# Patient Record
Sex: Female | Born: 1995 | Race: White | Hispanic: No | Marital: Single | Smoking: Current some day smoker
Health system: Southern US, Community
[De-identification: ages and names within clinical notes are randomized; demographics above are authoritative.]

## PROBLEM LIST (undated history)

## (undated) ENCOUNTER — Emergency Department (HOSPITAL_COMMUNITY): Admission: EM | Payer: Self-pay | Source: Home / Self Care

## (undated) DIAGNOSIS — J45909 Unspecified asthma, uncomplicated: Secondary | ICD-10-CM

## (undated) DIAGNOSIS — I10 Essential (primary) hypertension: Secondary | ICD-10-CM

## (undated) DIAGNOSIS — IMO0002 Reserved for concepts with insufficient information to code with codable children: Secondary | ICD-10-CM

## (undated) DIAGNOSIS — F418 Other specified anxiety disorders: Secondary | ICD-10-CM

## (undated) DIAGNOSIS — T7840XA Allergy, unspecified, initial encounter: Secondary | ICD-10-CM

## (undated) DIAGNOSIS — M7989 Other specified soft tissue disorders: Secondary | ICD-10-CM

## (undated) DIAGNOSIS — B9689 Other specified bacterial agents as the cause of diseases classified elsewhere: Secondary | ICD-10-CM

## (undated) DIAGNOSIS — Z975 Presence of (intrauterine) contraceptive device: Secondary | ICD-10-CM

## (undated) DIAGNOSIS — F429 Obsessive-compulsive disorder, unspecified: Secondary | ICD-10-CM

## (undated) DIAGNOSIS — E669 Obesity, unspecified: Secondary | ICD-10-CM

## (undated) DIAGNOSIS — R4586 Emotional lability: Secondary | ICD-10-CM

## (undated) DIAGNOSIS — F319 Bipolar disorder, unspecified: Secondary | ICD-10-CM

## (undated) HISTORY — PX: CYST EXCISION: SHX5701

## (undated) HISTORY — PX: OTHER SURGICAL HISTORY: SHX169

## (undated) HISTORY — DX: Emotional lability: R45.86

## (undated) HISTORY — DX: Allergy, unspecified, initial encounter: T78.40XA

---
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
## 2014-03-06 ENCOUNTER — Ambulatory Visit: Payer: Self-pay | Admitting: Family Medicine

## 2014-12-18 ENCOUNTER — Encounter: Payer: Self-pay | Admitting: Family Medicine

## 2014-12-18 ENCOUNTER — Ambulatory Visit (INDEPENDENT_AMBULATORY_CARE_PROVIDER_SITE_OTHER): Payer: Medicaid Other | Admitting: Family Medicine

## 2014-12-18 VITALS — BP 114/78 | HR 79 | Temp 98.6°F | Ht 64.0 in | Wt 161.6 lb

## 2014-12-18 DIAGNOSIS — J019 Acute sinusitis, unspecified: Secondary | ICD-10-CM

## 2014-12-18 MED ORDER — FLUTICASONE PROPIONATE 50 MCG/ACT NA SUSP: 1.0000 | Freq: Two times a day (BID) | NASAL | Status: DC | PRN

## 2014-12-18 MED ORDER — CETIRIZINE HCL 5 MG/5ML PO SYRP: 10.0000 mg | ORAL_SOLUTION | Freq: Every day | ORAL | Status: DC

## 2014-12-18 NOTE — Progress Notes (Signed)
BP 114/78 mmHg  Pulse 79  Temp(Src) 98.6 F (37 C) (Oral)  Ht  (1.626 m)  Wt 161 lb 9.6 oz (73.301 kg)  BMI 27.72 kg/m2   Subjective:    Patient ID: Tonya Franklin, female    DOB: 12-Jan-1996, 19 y.o.   MRN: [id number]  HPI: Tonya Franklin is a 19 y.o. female presenting on 12/18/2014 for Cough; Sinusitis; and URI   HPI Cough and sinus pressure Patient has been having cough and sinus pressure for the past 3-4 days. She says she had a palpable fever about 4 days ago but none since. Her mother has been ill before her. she just recently had a new child 5 days ago. She does have yellow to white productive phlegm when she coughs and is getting a lot of postnasal drainage and sinus pressure. She has tried Tylenol and ibuprofen over-the-counter without much success. She says she cannot take pills at all. Her baby is not ill at all yet. Discussed the possibility of starting breast-feeding to protect her baby.  Relevant past medical, surgical, family and social history reviewed and updated as indicated. Interim medical history since our last visit reviewed. Allergies and medications reviewed and updated.  Review of Systems  Constitutional: Negative for fever and chills.  HENT: Positive for congestion, postnasal drip, rhinorrhea, sinus pressure and sore throat. Negative for ear discharge, ear pain and sneezing.   Eyes: Negative for pain, redness and visual disturbance.  Respiratory: Positive for cough. Negative for chest tightness and shortness of breath.   Cardiovascular: Negative for chest pain and leg swelling.  Genitourinary: Negative for dysuria and difficulty urinating.  Musculoskeletal: Negative for back pain and gait problem.  Skin: Negative for rash.  Neurological: Negative for dizziness, light-headedness and headaches.  Psychiatric/Behavioral: Negative for behavioral problems and agitation.  All other systems reviewed and are negative.   Per HPI unless specifically  indicated above     Medication List       This list is accurate as of: 12/18/14  2:05 PM.  Always use your most recent med list.               cetirizine HCl 5 MG/5ML Syrp  Commonly known as:  Zyrtec  Take 10 mLs (10 mg total) by mouth daily.     fluticasone 50 MCG/ACT nasal spray  Commonly known as:  FLONASE  Place 1 spray into both nostrils 2 (two) times daily as needed for allergies or rhinitis.           Objective:    BP 114/78 mmHg  Pulse 79  Temp(Src) 98.6 F (37 C) (Oral)  Ht  (1.626 m)  Wt 161 lb 9.6 oz (73.301 kg)  BMI 27.72 kg/m2  Wt Readings from Last 3 Encounters:  12/18/14 161 lb 9.6 oz (73.301 kg)  05/16/13 158 lb 3.2 oz (71.759 kg) (87 %*, Z = 1.14)  02/26/13 150 lb (68.04 kg) (82 %*, Z = 0.93)   * Growth percentiles are based on CDC 2-20 Years data.    Physical Exam  Constitutional: She is oriented to person, place, and time. She appears well-developed and well-nourished. No distress.  HENT:  Right Ear: Tympanic membrane, external ear and ear canal normal.  Left Ear: Tympanic membrane, external ear and ear canal normal.  Nose: Mucosal edema and rhinorrhea present. No epistaxis. Right sinus exhibits no maxillary sinus tenderness and no frontal sinus tenderness. Left sinus exhibits no maxillary sinus tenderness and no frontal  sinus tenderness.  Mouth/Throat: Uvula is midline and mucous membranes are normal. Posterior oropharyngeal edema and posterior oropharyngeal erythema present. No oropharyngeal exudate or tonsillar abscesses.  Eyes: Conjunctivae and EOM are normal. Pupils are equal, round, and reactive to light.  Neck: Neck supple. No thyromegaly present.  Cardiovascular: Normal rate, regular rhythm, normal heart sounds and intact distal pulses.   No murmur heard. Pulmonary/Chest: Effort normal and breath sounds normal. No respiratory distress. She has no wheezes.  Musculoskeletal: Normal range of motion. She exhibits no edema or tenderness.   Lymphadenopathy:    She has no cervical adenopathy.  Neurological: She is alert and oriented to person, place, and time. Coordination normal.  Skin: Skin is warm and dry. No rash noted. She is not diaphoretic.  Psychiatric: She has a normal mood and affect. Her behavior is normal.  Nursing note and vitals reviewed.   Results for orders placed or performed in visit on 05/16/13  GC/Chlamydia Probe Amp  Result Value Ref Range   Chlamydia trachomatis, NAA Negative Negative   Neisseria gonorrhoeae by PCR Negative Negative   PLEASE NOTE: Comment   POCT urinalysis dipstick  Result Value Ref Range   Color, UA yellow    Clarity, UA cloudy    Glucose, UA neg    Bilirubin, UA neg    Ketones, UA small    Spec Grav, UA 1.010    Blood, UA trace    pH, UA 6.0    Protein, UA neg    Urobilinogen, UA negative    Nitrite, UA neg    Leukocytes, UA Trace   POCT UA - Microscopic Only  Result Value Ref Range   WBC, Ur, HPF, POC 5-10    RBC, urine, microscopic 1-5    Bacteria, U Microscopic neg    Mucus, UA neg    Epithelial cells, urine per micros occ    Crystals, Ur, HPF, POC neg    Casts, Ur, LPF, POC neg    Yeast, UA neg       Assessment & Plan:   Problem List Items Addressed This Visit    None    Visit Diagnoses    Acute rhinosinusitis    -  Primary    Relevant Medications    fluticasone (FLONASE) 50 MCG/ACT nasal spray    cetirizine HCl (ZYRTEC) 5 MG/5ML SYRP        Follow up plan: Return if symptoms worsen or fail to improve.  Counseling provided for all of the vaccine components No orders of the defined types were placed in this encounter.    Arville CareJoshua Mailin Coglianese, MD [facility] 12/18/2014, 2:05 PM

## 2015-01-08 ENCOUNTER — Ambulatory Visit: Payer: Medicaid Other | Admitting: Family Medicine

## 2015-01-11 ENCOUNTER — Encounter: Payer: Self-pay | Admitting: Nurse Practitioner

## 2015-01-15 ENCOUNTER — Ambulatory Visit: Payer: Medicaid Other | Admitting: Pediatrics

## 2015-02-12 ENCOUNTER — Ambulatory Visit (INDEPENDENT_AMBULATORY_CARE_PROVIDER_SITE_OTHER): Payer: Medicaid Other | Admitting: Family Medicine

## 2015-02-12 VITALS — Temp 98.8°F | Ht 64.0 in | Wt 163.0 lb

## 2015-02-12 DIAGNOSIS — J40 Bronchitis, not specified as acute or chronic: Secondary | ICD-10-CM | POA: Diagnosis not present

## 2015-02-12 DIAGNOSIS — J029 Acute pharyngitis, unspecified: Secondary | ICD-10-CM | POA: Diagnosis not present

## 2015-02-12 DIAGNOSIS — J329 Chronic sinusitis, unspecified: Secondary | ICD-10-CM | POA: Diagnosis not present

## 2015-02-12 LAB — POCT RAPID STREP A (OFFICE): Rapid Strep A Screen: NEGATIVE

## 2015-02-12 MED ORDER — AMOXICILLIN-POT CLAVULANATE 875-125 MG PO TABS: 1.0000 | ORAL_TABLET | Freq: Two times a day (BID) | ORAL | Status: DC

## 2015-02-12 MED ORDER — PSEUDOEPHEDRINE-GUAIFENESIN ER 120-1200 MG PO TB12: 1.0000 | ORAL_TABLET | Freq: Two times a day (BID) | ORAL | Status: DC

## 2015-02-12 NOTE — Progress Notes (Signed)
Subjective:  Patient ID: Tonya Franklin, female    DOB: Jun 23, 1995  Age: 20 y.o. MRN: [id number]  CC: Sore Throat   HPI Tonya Franklin presents for Patient presents with upper respiratory congestion. Rhinorrhea that is frequently purulent. There is moderate sore throat. Patient reports coughing frequently as well.-colored/purulent sputum noted. There is no fever no chills no sweats. The patient denies being short of breath. Onset was 3-5 days ago. Gradually worsening in spite of home remedies.   History Tonya Franklin has a past medical history of Mood swings (HCC) (2014); Asthma; and Bipolar disorder (HCC).   Tonya Franklin has past surgical history that includes adnoids and Cyst excision.   Her family history includes Cancer in her other, other, and paternal grandmother; Diabetes in her father; Hyperlipidemia in her father; Hypertension in her father.Tonya Franklin reports that Tonya Franklin quit smoking about 10 months ago. Her smoking use included Cigarettes. Tonya Franklin does not have any smokeless tobacco history on file. Tonya Franklin reports that Tonya Franklin does not drink alcohol or use illicit drugs.  Outpatient Prescriptions Prior to Visit  Medication Sig Dispense Refill  . cetirizine HCl (ZYRTEC) 5 MG/5ML SYRP Take 10 mLs (10 mg total) by mouth daily. 473 mL 1  . fluticasone (FLONASE) 50 MCG/ACT nasal spray Place 1 spray into both nostrils 2 (two) times daily as needed for allergies or rhinitis. 16 g 6   No facility-administered medications prior to visit.    ROS Review of Systems  Constitutional: Negative for fever, chills, activity change and appetite change.  HENT: Positive for congestion, postnasal drip, rhinorrhea and sinus pressure. Negative for ear discharge, ear pain, hearing loss, nosebleeds, sneezing and trouble swallowing.   Respiratory: Negative for chest tightness and shortness of breath.   Cardiovascular: Negative for chest pain and palpitations.  Skin: Negative for rash.  Psychiatric/Behavioral:       Concerned  about inattentiveness. Would like ADD eval.     Objective:  Temp(Src) 98.8 F (37.1 C) (Oral)  Ht 5\' 4"  (1.626 m)  Wt 163 lb (73.936 kg)  BMI 27.97 kg/m2  BP Readings from Last 3 Encounters:  12/18/14 114/78  05/16/13 119/63  02/26/13 121/75    Wt Readings from Last 3 Encounters:  02/12/15 163 lb (73.936 kg)  12/18/14 161 lb 9.6 oz (73.301 kg)  05/16/13 158 lb 3.2 oz (71.759 kg) (87 %*, Z = 1.14)   * Growth percentiles are based on CDC 2-20 Years data.     Physical Exam  Constitutional: Tonya Franklin appears well-developed and well-nourished.  HENT:  Head: Normocephalic and atraumatic.  Right Ear: Tympanic membrane and external ear normal. No decreased hearing is noted.  Left Ear: Tympanic membrane and external ear normal. No decreased hearing is noted.  Nose: Mucosal edema present. Right sinus exhibits no frontal sinus tenderness. Left sinus exhibits no frontal sinus tenderness.  Mouth/Throat: No oropharyngeal exudate or posterior oropharyngeal erythema.  Neck: No Brudzinski's sign noted.  Pulmonary/Chest: Breath sounds normal. No respiratory distress.  Lymphadenopathy:       Head (right side): No preauricular adenopathy present.       Head (left side): No preauricular adenopathy present.       Right cervical: No superficial cervical adenopathy present.      Left cervical: No superficial cervical adenopathy present.     Lab Results  Component Value Date   HGB 14.4 02/26/2013    No results found.  Assessment & Plan:   Tonya Franklin was seen today for sore throat.  Diagnoses and all orders  for this visit:  Sore throat -     POCT rapid strep A  Sinobronchitis  Other orders -     amoxicillin-clavulanate (AUGMENTIN) 875-125 MG tablet; Take 1 tablet by mouth 2 (two) times daily. Take all of this medication -     Pseudoephedrine-Guaifenesin [phone] MG TB12; Take 1 tablet by mouth 2 (two) times daily. For congestion   I am having Ms. Tonya Franklin start on  amoxicillin-clavulanate and Pseudoephedrine-Guaifenesin. I am also having her maintain her fluticasone and cetirizine HCl.  Meds ordered this encounter  Medications  . amoxicillin-clavulanate (AUGMENTIN) 875-125 MG tablet    Sig: Take 1 tablet by mouth 2 (two) times daily. Take all of this medication    Dispense:  20 tablet    Refill:  0  . Pseudoephedrine-Guaifenesin [phone] MG TB12    Sig: Take 1 tablet by mouth 2 (two) times daily. For congestion    Dispense:  20 each    Refill:  0     Follow-up: Return in about 2 weeks (around 02/26/2015) for ADD eval - 30 min please.  Mechele ClaudeWarren Analya Louissaint, M.D.

## 2015-03-22 ENCOUNTER — Ambulatory Visit (INDEPENDENT_AMBULATORY_CARE_PROVIDER_SITE_OTHER): Payer: Medicaid Other | Admitting: Pediatrics

## 2015-03-22 ENCOUNTER — Encounter: Payer: Self-pay | Admitting: Pediatrics

## 2015-03-22 VITALS — BP 130/81 | HR 75 | Temp 99.1°F | Ht 64.0 in | Wt 171.6 lb

## 2015-03-22 DIAGNOSIS — F411 Generalized anxiety disorder: Secondary | ICD-10-CM

## 2015-03-22 DIAGNOSIS — M7989 Other specified soft tissue disorders: Secondary | ICD-10-CM | POA: Insufficient documentation

## 2015-03-22 HISTORY — DX: Other specified soft tissue disorders: M79.89

## 2015-03-22 MED ORDER — CITALOPRAM HYDROBROMIDE 20 MG PO TABS: ORAL_TABLET | ORAL | Status: DC

## 2015-03-22 NOTE — Progress Notes (Signed)
Subjective:    Patient ID: Mora Appl, female    DOB: 04-20-1995, 19 y.o.   MRN: [id number]  CC: Anxiety; Depression; and ADD or ADHD?   HPI: SARITA HAKANSON is a 20 y.o. female presenting for Anxiety; Depression; and ADD or ADHD?  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 Was present while pregnant, at times cam and went. No swelling L side. Abnormal sensation R side vs L foot, feels numb. Not tingling. No pain in foot. No redness in R LE.  Depression screen PHQ 2/9 03/22/2015  Decreased Interest 2  Down, Depressed, Hopeless 2  PHQ - 2 Score 4  Altered sleeping 3  Tired, decreased energy 3  Change in appetite 3  Feeling bad or failure about yourself  2  Trouble concentrating 3  Moving slowly or fidgety/restless 0  Suicidal thoughts 0  PHQ-9 Score 18  Difficult doing work/chores Somewhat difficult     Relevant past medical, surgical, family and social history reviewed and updated as indicated. Interim medical history since our last visit reviewed. Allergies and medications reviewed and updated.    ROS: Per HPI unless specifically indicated above  History  Smoking status  . Former Smoker  . Types: Cigarettes  . Quit date: 03/19/2014  Smokeless tobacco  . Not on file    Past Medical History Patient Active Problem List   Diagnosis Date Noted  . GAD (generalized anxiety disorder) 10/10/2012    Current Outpatient Prescriptions  Medication Sig Dispense Refill  .  citalopram (CELEXA) 20 MG tablet Take half tab for 8 days then take full tab daily 30 tablet 3   No current facility-administered medications for this visit.       Objective:    BP 130/81 mmHg  Pulse 75  Temp(Src) 99.1 F (37.3 C) (Oral)  Ht  (1.626 m)  Wt 171 lb 9.6 oz (77.837 kg)  BMI 29.44 kg/m2  Wt Readings from Last 3 Encounters:  03/22/15 171 lb 9.6 oz (77.837 kg)  02/12/15 163 lb (73.936 kg)  12/18/14 161 lb 9.6 oz (73.301 kg)     Gen: NAD, alert, cooperative with exam, NCAT EYES: EOMI, no scleral injection or icterus ENT:  TMs pearly gray b/l, OP without erythema LYMPH: no cervical LAD CV: NRRR, normal S1/S2, no murmur, distal pulses 2+ b/l Resp: CTABL, no wheezes, normal WOB Abd: +BS, soft, NTND. no guarding or organomegaly Neuro: Alert and oriented, toes are numb R side to monofilament testing and touch.  MSK: Non-pitting edema R foot and toes. Some R sided calf tenderness     Assessment & Plan:    Monasia was seen today for anxiety, depression, R LE swelling.  Diagnoses and all orders for this visit:  GAD (generalized anxiety disorder) -     citalopram (CELEXA) 20 MG tablet; Take half tab for 8 days then take full tab daily Gave list of counselors, strongly encouraged her to follow up with therapist. Now 3 mo post-partum.  Feels safe at home, feels like she is able to care for her baby.  Right leg swelling Pt post-partum, with several weeks of swelling in R foot, also with numbness in R toes, R calf tenderness. No personal or fam hx of clot. Will get [place] to r/o DVT. -     US Venous Img Lower Unilateral Right; Future    Follow up plan: Return in about 2 weeks (around 04/05/2015).  Rex Kras, MD [facility] 03/22/2015, 4:09 PM

## 2015-03-23 ENCOUNTER — Telehealth: Payer: Self-pay

## 2015-03-23 DIAGNOSIS — M7989 Other specified soft tissue disorders: Secondary | ICD-10-CM

## 2015-03-23 NOTE — Telephone Encounter (Signed)
[facility] to x-ray. Unable to leave message on daughters vm. Insurance denied daughters test for tomorrow and it has been cancelled. Please ask mother to let er daughter know.

## 2015-03-24 ENCOUNTER — Ambulatory Visit (HOSPITAL_COMMUNITY): Payer: Medicaid Other

## 2015-03-24 NOTE — Telephone Encounter (Signed)
Completed peer-to-peer phone call, case No. [id number]. Told that we were using an outdated code for the u/s, that the code 54098 is one that often works but they aren't able to tell us the correct code to place. Order re-sent with new code.

## 2015-04-07 ENCOUNTER — Telehealth: Payer: Self-pay | Admitting: Nurse Practitioner

## 2015-04-07 NOTE — Telephone Encounter (Signed)
Message for patient--we started her on a new medicine two weeks ago, it takes time for that to take effect to help with anxiety and depression. Originally I wanted her to come back in two weeks to make sure that things are not getting any worse. It can take time to this kind of medicine to help with mood. If she is getting worse she needs to be seen. There isnt anything I can call in.

## 2015-04-07 NOTE — Telephone Encounter (Signed)
Mom's opinion is that daughter is having a difficult time focusing.   She would like to know if there is anything over the counter or script that you could give to help with this. She does not think her daughter should need another visit,(I suggested may need to be seen again).  Please advise.

## 2015-04-07 NOTE — Telephone Encounter (Signed)
Gave patient's mother message from provider.  Appointment scheduled for a recheck.

## 2015-04-16 ENCOUNTER — Ambulatory Visit (INDEPENDENT_AMBULATORY_CARE_PROVIDER_SITE_OTHER): Payer: Medicaid Other | Admitting: Pediatrics

## 2015-04-16 ENCOUNTER — Encounter: Payer: Self-pay | Admitting: Pediatrics

## 2015-04-16 VITALS — BP 132/78 | HR 78 | Temp 97.0°F | Ht 64.0 in | Wt 167.2 lb

## 2015-04-16 DIAGNOSIS — F411 Generalized anxiety disorder: Secondary | ICD-10-CM | POA: Diagnosis not present

## 2015-04-16 DIAGNOSIS — R41840 Attention and concentration deficit: Secondary | ICD-10-CM

## 2015-04-16 NOTE — Progress Notes (Signed)
Subjective:    Patient ID: Tonya Franklin, female    DOB: 1995/06/20, 20 y.o.   MRN: [id number]  CC: Follow-up and Trouble concentrating   HPI: Tonya ApplKennedy L Verhagen is a 20 y.o. female presenting for Follow-up and Trouble concentrating  Has a problem with concentrating now Feels like her thoughts are going everywhere Thinks the celexa is helping a lot with anxiety Feels like she is able to care for herself and baby fine Had Bs throughout school Senior year was harder, started failing classes She denies drug use, stress at home, no other identifiable cause to make senior year grades worse than other years Not working now Interested in getting back to college or work    Depression screen [facility] 2/9 04/16/2015 03/22/2015  Decreased Interest 0 2  Down, Depressed, Hopeless 0 2  PHQ - 2 Score 0 4  Altered sleeping 0 3  Tired, decreased energy 0 3  Change in appetite 0 3  Feeling bad or failure about yourself  0 2  Trouble concentrating 3 3  Moving slowly or fidgety/restless 0 0  Suicidal thoughts - 0  PHQ-9 Score 3 18  Difficult doing work/chores - Somewhat difficult     Relevant past medical, surgical, family and social history reviewed and updated as indicated. Interim medical history since our last visit reviewed. Allergies and medications reviewed and updated.    ROS: Per HPI unless specifically indicated above  History  Smoking status  . Former Smoker  . Types: Cigarettes  . Quit date: 03/19/2014  Smokeless tobacco  . Not on file    Past Medical History Patient Active Problem List   Diagnosis Date Noted  . GAD (generalized anxiety disorder) 10/10/2012    Current Outpatient Prescriptions  Medication Sig Dispense Refill  . citalopram (CELEXA) 20 MG tablet Take half tab for 8 days then take full tab daily 30 tablet 3   No current facility-administered medications for this visit.       Objective:    BP 132/78 mmHg  Pulse 78  Temp(Src) 97 F (36.1 C)  (Oral)  Ht 5\' 4"  (1.626 m)  Wt 167 lb 3.2 oz (75.841 kg)  BMI 28.69 kg/m2  Wt Readings from Last 3 Encounters:  04/16/15 167 lb 3.2 oz (75.841 kg)  03/22/15 171 lb 9.6 oz (77.837 kg)  02/12/15 163 lb (73.936 kg)     Gen: NAD, alert, cooperative with exam, NCAT EYES: EOMI, no scleral injection or icterus ENT:  TMs pearly gray b/l, OP without erythema LYMPH: no cervical LAD CV: NRRR, normal S1/S2, no murmur, distal pulses 2+ b/l Resp: CTABL, no wheezes, normal WOB Ext: No edema, warm Neuro: Alert and oriented     Assessment & Plan:    Kyung RuddKennedy was seen today for follow-up and trouble concentrating.  Diagnoses and all orders for this visit:  Difficulty concentrating  Has been on vyvanse in remote past, not regularly throughout schooling. Per pt grades were fine until senior year, pt couldn't pinpoint any other cause for grades to be off though says she has often been hyperactive. Being in sports while in high school she says helped the hyperactivity and councentration. Not currently working or in school, caring for herself and baby without trouble. Encouraged her to get back to regular exercise, have a schedule for her days even if she doesn't have to. Gave list of counselors. No indications for other medication at this time.  GAD (generalized anxiety disorder) Symptoms much improved, pt doesn't want  to increase dose. Continue celexa, switch to taking it in the day as may be disrupting sleep.   Insomnia Discussed sleep hygiene, changing celexa timing, melatonin if needed   Follow up plan: Return in about 6 months (around 10/17/2015).  Rex Kras, MD [facility] 04/16/2015, 2:28 PM

## 2015-04-16 NOTE — Patient Instructions (Addendum)
Melatonin as needed for sleep, can get over the counter at drug store Take celexa in the morning

## 2015-05-31 ENCOUNTER — Encounter: Payer: Self-pay | Admitting: Women's Health

## 2015-05-31 ENCOUNTER — Other Ambulatory Visit (INDEPENDENT_AMBULATORY_CARE_PROVIDER_SITE_OTHER): Payer: Medicaid Other

## 2015-05-31 ENCOUNTER — Ambulatory Visit (INDEPENDENT_AMBULATORY_CARE_PROVIDER_SITE_OTHER): Payer: Medicaid Other | Admitting: Women's Health

## 2015-05-31 VITALS — BP 110/70 | HR 72 | Ht 64.0 in | Wt 175.0 lb

## 2015-05-31 DIAGNOSIS — N938 Other specified abnormal uterine and vaginal bleeding: Secondary | ICD-10-CM

## 2015-05-31 DIAGNOSIS — Z975 Presence of (intrauterine) contraceptive device: Secondary | ICD-10-CM | POA: Diagnosis not present

## 2015-05-31 DIAGNOSIS — Z30431 Encounter for routine checking of intrauterine contraceptive device: Secondary | ICD-10-CM

## 2015-05-31 DIAGNOSIS — N760 Acute vaginitis: Secondary | ICD-10-CM

## 2015-05-31 DIAGNOSIS — B9689 Other specified bacterial agents as the cause of diseases classified elsewhere: Secondary | ICD-10-CM

## 2015-05-31 DIAGNOSIS — A499 Bacterial infection, unspecified: Secondary | ICD-10-CM

## 2015-05-31 DIAGNOSIS — R102 Pelvic and perineal pain: Secondary | ICD-10-CM

## 2015-05-31 DIAGNOSIS — N926 Irregular menstruation, unspecified: Secondary | ICD-10-CM | POA: Diagnosis not present

## 2015-05-31 HISTORY — DX: Other specified bacterial agents as the cause of diseases classified elsewhere: B96.89

## 2015-05-31 LAB — POCT WET PREP (WET MOUNT): Clue Cells Wet Prep Whiff POC: POSITIVE

## 2015-05-31 MED ORDER — METRONIDAZOLE 0.75 % VA GEL: 1.0000 | Freq: Every day | VAGINAL | Status: DC

## 2015-05-31 NOTE — Patient Instructions (Signed)

## 2015-05-31 NOTE — Progress Notes (Signed)
Patient ID: Tonya Franklin Grandinetti, female   DOB: 08/25/1995, 20 y.o.   MRN: [id number]   [facility]  Patient name: Tonya Franklin Soden MRN [id number]  Date of birth: 08/25/1995  CC & HPI:  Tonya Franklin Blanck is a 20 y.o. Caucasian female presenting today for report of pelvic pain and irregular bleeding since Mirena IUD placed 6mths ago after birth of her child [place]. Pain is pretty much constant, with or without sex. Did try feeling for strings but was unable to feel them. Bleeding has been pretty much constant since placement, at times light, right now just pinkish. Denies any abnormal d/c, itching/odor/irritation. Is sexually active, has not had recent gc/ct testing.   No LMP recorded. The current method of family planning is IUD. [facility]- states they sent her letter in mail stating she needed f/u but didn't go- wants to come here for f/u  Pertinent History Reviewed:  Medical & Surgical Hx:   Past medical, surgical, family, and social history reviewed in electronic medical record Medications: Reviewed & Updated - see associated section Allergies: Reviewed in electronic medical record  Objective Findings:  Vitals: BP 110/70 mmHg  Pulse 72  Ht 5\' 4"  (1.626 m)  Wt 175 lb (79.379 kg)  BMI 30.02 kg/m2 Body mass index is 30.02 kg/(m^2).  Physical Examination: General appearance - alert, well appearing, and in no distress Pelvic - normal external genitalia, cx clear, IUD strings not visible, light pink slightly malodorous d/c  Results for orders placed or performed in visit on 05/31/15 (from the past 24 hour(s))  POCT Wet Prep Mellody Drown(Wet [facility])   Collection Time: 05/31/15  9:07 AM  Result Value Ref Range   Source Wet Prep POC vaginal    WBC, Wet Prep HPF POC none    Bacteria Wet Prep HPF POC None None, Few, Too numerous to count   BACTERIA WET PREP MORPHOLOGY POC     Clue Cells Wet Prep HPF POC Many (A) None, Too numerous to count   Clue Cells Wet Prep Whiff POC Positive Whiff    Yeast Wet Prep HPF POC None    KOH Wet Prep POC     Trichomonas Wet Prep HPF POC none     [facility]/ [facility] for [facility] u/s: IUD in correct placement, uterus/ovaries normal  Assessment & Plan:  A:   Pelvic pain  Irregular bleeding  BV  IUD in correct placement, strings not visible  P:  GC/CT from urine  Metrogel q hs x 5nights, no sex or etoh while taking  [place], will call pt w/ when she needs to return based on pap  No Follow-up on file.  Marge DuncansBooker, Kazuma Elena Randall CNM, [facility] 05/31/2015 9:07 AM

## 2015-05-31 NOTE — Progress Notes (Signed)
PELVIC US TV: homogenous anteverted uterus,IUD is centrally located w/in the endometrium,EEC 3.6 mm,normal ov's bilat (mobile),no free fluid seen

## 2015-06-01 LAB — GC/CHLAMYDIA PROBE AMP
Chlamydia trachomatis, NAA: NEGATIVE
Neisseria gonorrhoeae by PCR: NEGATIVE

## 2015-06-07 ENCOUNTER — Encounter: Payer: Self-pay | Admitting: Women's Health

## 2015-06-07 ENCOUNTER — Telehealth: Payer: Self-pay | Admitting: Women's Health

## 2015-06-07 DIAGNOSIS — IMO0002 Reserved for concepts with insufficient information to code with codable children: Secondary | ICD-10-CM

## 2015-06-07 HISTORY — DX: Reserved for concepts with insufficient information to code with codable children: IMO0002

## 2015-06-07 NOTE — Telephone Encounter (Signed)
Notified pt of [facility] [place] [facility], does need colpo. Switched to front to schedule. Questions answered.  Cheral MarkerKimberly R. Veronica Guerrant, CNM, [facility] 06/07/2015 12:48 PM

## 2015-06-11 ENCOUNTER — Encounter: Payer: Self-pay | Admitting: Obstetrics & Gynecology

## 2015-06-11 ENCOUNTER — Ambulatory Visit (INDEPENDENT_AMBULATORY_CARE_PROVIDER_SITE_OTHER): Payer: Medicaid Other | Admitting: Obstetrics & Gynecology

## 2015-06-11 VITALS — BP 120/70 | HR 78 | Ht 64.0 in | Wt 177.0 lb

## 2015-06-11 DIAGNOSIS — R896 Abnormal cytological findings in specimens from other organs, systems and tissues: Secondary | ICD-10-CM | POA: Diagnosis not present

## 2015-06-11 DIAGNOSIS — R87612 Low grade squamous intraepithelial lesion on cytologic smear of cervix (LGSIL): Secondary | ICD-10-CM | POA: Diagnosis not present

## 2015-06-11 DIAGNOSIS — N870 Mild cervical dysplasia: Secondary | ICD-10-CM

## 2015-06-11 DIAGNOSIS — B373 Candidiasis of vulva and vagina: Secondary | ICD-10-CM

## 2015-06-11 DIAGNOSIS — B3731 Acute candidiasis of vulva and vagina: Secondary | ICD-10-CM

## 2015-06-11 DIAGNOSIS — IMO0002 Reserved for concepts with insufficient information to code with codable children: Secondary | ICD-10-CM

## 2015-06-11 MED ORDER — TERCONAZOLE 0.4 % VA CREA: 1.0000 | TOPICAL_CREAM | Freq: Every day | VAGINAL | Status: DC

## 2015-06-11 NOTE — Progress Notes (Signed)
Patient ID: Mora ApplKennedy L Gunderman, female   DOB: Oct 14, 1995, 20 y.o.   MRN: [id number] Colposcopy Procedure Note:  Colposcopy Procedure Note  Indications: Pap smear 10 months ago showed: low-grade squamous intraepithelial neoplasia (LGSIL - encompassing HPV,mild dysplasia,CIN I). The prior pap showed this was her first Pap smear.  Prior cervical/vaginal disease: na. Prior cervical treatment: na.  Smoker:  No. New sexual partner:  No.  : time frame:    History of abnormal Pap: no  Procedure Details  The risks and benefits of the procedure and Written informed consent obtained.  Speculum placed in vagina and excellent visualization of cervix achieved, cervix swabbed x 3 with acetic acid solution.  Findings: Cervix: no visible lesions, no mosaicism, no punctation, no abnormal vasculature and HPV changes noted at anterior cervix no biopsies taken. Vaginal inspection: normal without visible lesions. Vulvar colposcopy: vulvar colposcopy not performed.  Specimens: none  Complications: none.  Plan: Follow up Pap smear 1 year  Has post metro gel yeast infection

## 2015-12-02 ENCOUNTER — Ambulatory Visit: Payer: Medicaid Other | Admitting: Family Medicine

## 2015-12-03 ENCOUNTER — Encounter: Payer: Self-pay | Admitting: Nurse Practitioner

## 2015-12-03 ENCOUNTER — Telehealth: Payer: Self-pay | Admitting: Nurse Practitioner

## 2016-01-25 NOTE — Telephone Encounter (Signed)
Pt had a letter sent out, will close encounter.

## 2016-10-23 DIAGNOSIS — Z975 Presence of (intrauterine) contraceptive device: Secondary | ICD-10-CM | POA: Insufficient documentation

## 2016-10-23 HISTORY — DX: Presence of (intrauterine) contraceptive device: Z97.5

## 2017-06-29 ENCOUNTER — Encounter: Payer: Self-pay | Admitting: Family

## 2017-06-29 ENCOUNTER — Ambulatory Visit: Payer: Self-pay | Admitting: Family

## 2017-06-29 VITALS — BP 132/89 | HR 72 | Temp 97.5°F | Ht 64.0 in | Wt 194.0 lb

## 2017-06-29 DIAGNOSIS — F429 Obsessive-compulsive disorder, unspecified: Secondary | ICD-10-CM

## 2017-06-29 DIAGNOSIS — E669 Obesity, unspecified: Secondary | ICD-10-CM

## 2017-06-29 DIAGNOSIS — E663 Overweight: Secondary | ICD-10-CM | POA: Insufficient documentation

## 2017-06-29 DIAGNOSIS — F411 Generalized anxiety disorder: Secondary | ICD-10-CM

## 2017-06-29 DIAGNOSIS — F1010 Alcohol abuse, uncomplicated: Secondary | ICD-10-CM

## 2017-06-29 HISTORY — DX: Obsessive-compulsive disorder, unspecified: F42.9

## 2017-06-29 HISTORY — DX: Obesity, unspecified: E66.9

## 2017-06-29 MED ORDER — BUSPIRONE HCL 10 MG PO TABS: 10.0000 mg | ORAL_TABLET | Freq: Three times a day (TID) | ORAL | 1 refills | Status: DC

## 2017-06-29 MED ORDER — FLUOXETINE HCL 20 MG PO TABS: 20.0000 mg | ORAL_TABLET | Freq: Every day | ORAL | 0 refills | Status: DC

## 2017-06-29 NOTE — Patient Instructions (Addendum)
Obsessive-Compulsive Disorder Obsessive-compulsive disorder (OCD) is a brain-based anxiety disorder. People with OCD have obsessions, compulsions, or both. Obsessions are unwanted and distressing thoughts, ideas, or urges that keep entering your mind and result in anxiety. You may find yourself trying to ignore them. You may try to stop or undo them with a compulsion. Compulsions are repetitive physical or mental acts that you feel you have to do. They may reduce or prevent any emotional distress, but in most instances, they are ineffective. Compulsions can be very time-consuming, often taking more than one hour each day. They can interfere with personal relationships and normal activities at home, school, or work. OCD can begin in childhood, but it usually starts in young adulthood and continues throughout life. Many people with OCD also have depression or another mental health disorder. What are the causes? The cause of this condition is not known. What increases the risk? This condition is more like to develop in:  People who have experienced trauma.  People who have a family history of OCD.  Women during and after pregnancy.  People who have infections and post-infectious autoimmune syndrome.  People who have other mental health conditions.  People who abuse substances.  What are the signs or symptoms? Symptoms of OCD include compulsions and obsessions. People with obsessions usually have a fear that something terrible will happen or that they will do something terrible. Examples of common obsessions include:  Fear of contamination with germs, waste, or poisonous substances.  Fear of making the wrong decision.  Violent or sexual thoughts or urges towards others.  Need for symmetry or exactness.  Examples of common compulsions include:  Excessive handwashing or bathing due to fear of contamination.  Checking things over and over to make sure you finished a task, such as making  sure you locked a door or unplugged a toaster.  Repeating an act or phrase over and over, sometimes a specific number of times, until it feels right.  Arranging and rearranging objects to keep them in a certain order.  Having a very hard time making a decision and sticking to it.  How is this diagnosed? OCD is diagnosed through an assessment by your health care provider. Your health care provider will ask questions about any obsessions or compulsions you have and how they affect your life. Your health care provider may also ask about your medical history, prescription medicines, and drug use. Certain medical conditions and substances can cause symptoms that are similar to OCD. Your health care provider may also refer you to a mental health specialist. How is this treated? Treatment may include:  Cognitive therapy. This is a form of talk therapy. The goal is to identify and change the irrational thoughts associated with obsessions.  Behavioral therapy. A type of behavioral therapy called exposure and response prevention is often used. In this therapy, you will be exposed to the distressing situation that triggers your compulsion and be prevented from responding to it. With repetition of this process over time, you will no longer feel the distress or need to perform the compulsion.  Self-soothing. Meditation, deep breathing, or yoga can help you manage the physiological symptoms of anxiety and can help with how you think.  Medicine. Certain types of antidepressant medicine may help reduce or control OCD symptoms. Medicine is most effective when used with cognitive or behavioral therapy.  Treatment usually involves a combination of therapy and medicines. For severe OCD that does not respond to talk therapy and medicine, brain surgery   or electrical stimulation of specific areas of the brain (deep brain stimulation) may be considered. Follow these instructions at home:  Take over-the-counter and  prescription medicines only as told by your health care provider. Do not start taking any new medicines with approval from your health care provider.  Consider joining a support group for people with OCD.  Keep all follow-up visits as told by your health care provider. This is important. Contact a health care provider if:  You are not able to take your medicines as prescribed.  Your symptoms get worse. Get help right away if:  You have suicidal thoughts or thoughts about hurting yourself or others. If you ever feel like you may hurt yourself or others, or have thoughts about taking your own life, get help right away. You can go to your nearest emergency department or call:  Your local emergency services (911 in the [place]  A suicide crisis helpline, such as the National Suicide Prevention Lifeline at (719)342-40921-424-704-0257. This is open 24-hours a day.  Summary  Obsessive-compulsive disorder (OCD) is a brain-based anxiety disorder. People with OCD have obsessions, compulsions, or both.  OCD can interfere with personal relationships and normal activities at home, school, or work.  Treatment usually involves a combination of therapy and medicines. This information is not intended to replace advice given to you by your health care provider. Make sure you discuss any questions you have with your health care provider. Document Released: 01/10/2001 Document Revised: 11/01/2015 Document Reviewed: 11/01/2015 Elsevier Interactive Patient Education  2018 [employer] Inc[employer]   Generalized Anxiety Disorder, Adult Generalized anxiety disorder (GAD) is a mental health disorder. People with this condition constantly worry about everyday events. Unlike normal anxiety, worry related to GAD is not triggered by a specific event. These worries also do not fade or get better with time. GAD interferes with life functions, including relationships, work, and school. GAD can vary from mild to severe. People with severe  GAD can have intense waves of anxiety with physical symptoms (panic attacks). What are the causes? The exact cause of GAD is not known. What increases the risk? This condition is more likely to develop in:  Women.  People who have a family history of anxiety disorders.  People who are very shy.  People who experience very stressful life events, such as the death of a loved one.  People who have a very stressful family environment.  What are the signs or symptoms? People with GAD often worry excessively about many things in their lives, such as their health and family. They may also be overly concerned about:  Doing well at work.  Being on time.  Natural disasters.  Friendships.  Physical symptoms of GAD include:  Fatigue.  Muscle tension or having muscle twitches.  Trembling or feeling shaky.  Being easily startled.  Feeling like your heart is pounding or racing.  Feeling out of breath or like you cannot take a deep breath.  Having trouble falling asleep or staying asleep.  Sweating.  Nausea, diarrhea, or irritable bowel syndrome (IBS).  Headaches.  Trouble concentrating or remembering facts.  Restlessness.  Irritability.  How is this diagnosed? Your health care provider can diagnose GAD based on your symptoms and medical history. You will also have a physical exam. The health care provider will ask specific questions about your symptoms, including how severe they are, when they started, and if they come and go. Your health care provider may ask you about your use of alcohol or  drugs, including prescription medicines. Your health care provider may refer you to a mental health specialist for further evaluation. Your health care provider will do a thorough examination and may perform additional tests to rule out other possible causes of your symptoms. To be diagnosed with GAD, a person must have anxiety that:  Is out of his or her control.  Affects several  different aspects of his or her life, such as work and relationships.  Causes distress that makes him or her unable to take part in normal activities.  Includes at least three physical symptoms of GAD, such as restlessness, fatigue, trouble concentrating, irritability, muscle tension, or sleep problems.  Before your health care provider can confirm a diagnosis of GAD, these symptoms must be present more days than they are not, and they must last for six months or longer. How is this treated? The following therapies are usually used to treat GAD:  Medicine. Antidepressant medicine is usually prescribed for long-term daily control. Antianxiety medicines may be added in severe cases, especially when panic attacks occur.  Talk therapy (psychotherapy). Certain types of talk therapy can be helpful in treating GAD by providing support, education, and guidance. Options include: ? Cognitive behavioral therapy (CBT). People learn coping skills and techniques to ease their anxiety. They learn to identify unrealistic or negative thoughts and behaviors and to replace them with positive ones. ? Acceptance and commitment therapy (ACT). This treatment teaches people how to be mindful as a way to cope with unwanted thoughts and feelings. ? Biofeedback. This process trains you to manage your body's response (physiological response) through breathing techniques and relaxation methods. You will work with a therapist while machines are used to monitor your physical symptoms.  Stress management techniques. These include yoga, meditation, and exercise.  A mental health specialist can help determine which treatment is best for you. Some people see improvement with one type of therapy. However, other people require a combination of therapies. Follow these instructions at home:  Take over-the-counter and prescription medicines only as told by your health care provider.  Try to maintain a normal routine.  Try to  anticipate stressful situations and allow extra time to manage them.  Practice any stress management or self-calming techniques as taught by your health care provider.  Do not punish yourself for setbacks or for not making progress.  Try to recognize your accomplishments, even if they are small.  Keep all follow-up visits as told by your health care provider. This is important. Contact a health care provider if:  Your symptoms do not get better.  Your symptoms get worse.  You have signs of depression, such as: ? A persistently sad, cranky, or irritable mood. ? Loss of enjoyment in activities that used to bring you joy. ? Change in weight or eating. ? Changes in sleeping habits. ? Avoiding friends or family members. ? Loss of energy for normal tasks. ? Feelings of guilt or worthlessness. Get help right away if:  You have serious thoughts about hurting yourself or others. If you ever feel like you may hurt yourself or others, or have thoughts about taking your own life, get help right away. You can go to your nearest emergency department or call:  Your local emergency services (911 in the [place]  A suicide crisis helpline, such as the National Suicide Prevention Lifeline at 650-433-6116. This is open 24 hours a day.  Summary  Generalized anxiety disorder (GAD) is a mental health disorder that involves worry that is  not triggered by a specific event.  People with GAD often worry excessively about many things in their lives, such as their health and family.  GAD may cause physical symptoms such as restlessness, trouble concentrating, sleep problems, frequent sweating, nausea, diarrhea, headaches, and trembling or muscle twitching.  A mental health specialist can help determine which treatment is best for you. Some people see improvement with one type of therapy. However, other people require a combination of therapies. This information is not intended to replace advice given to  you by your health care provider. Make sure you discuss any questions you have with your health care provider. Document Released: 05/13/2012 Document Revised: 12/07/2015 Document Reviewed: 12/07/2015 Elsevier Interactive Patient Education  [employer].
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
## 2017-08-21 ENCOUNTER — Ambulatory Visit (INDEPENDENT_AMBULATORY_CARE_PROVIDER_SITE_OTHER): Payer: Self-pay | Admitting: Family

## 2017-08-21 ENCOUNTER — Encounter: Payer: Self-pay | Admitting: Family

## 2017-08-21 ENCOUNTER — Ambulatory Visit: Payer: Self-pay | Admitting: Family Medicine

## 2017-08-21 VITALS — BP 130/87 | HR 86 | Temp 98.6°F | Ht 64.0 in | Wt 193.8 lb

## 2017-08-21 DIAGNOSIS — J020 Streptococcal pharyngitis: Secondary | ICD-10-CM

## 2017-08-21 DIAGNOSIS — J029 Acute pharyngitis, unspecified: Secondary | ICD-10-CM

## 2017-08-21 MED ORDER — AMOXICILLIN 400 MG/5ML PO SUSR: 500.0000 mg | Freq: Two times a day (BID) | ORAL | 0 refills | Status: DC

## 2017-08-21 NOTE — Progress Notes (Signed)
   Subjective:    Patient ID: Tonya Franklin L Eguia, female    DOB: 1995/07/16, 22 y.o.   MRN: [id number]  Chief Complaint  Patient presents with  . Sore Throat    Sore Throat   This is a new problem. The current episode started in the past 7 days. The problem has been rapidly worsening. The pain is worse on the right side. There has been no fever. The pain is at a severity of 8/10. The pain is moderate. Associated symptoms include congestion, ear pain, headaches, a hoarse voice, swollen glands and trouble swallowing. Pertinent negatives include no coughing. She has had exposure to strep. Exposure to: co-workers. She has tried acetaminophen and gargles for the symptoms. The treatment provided mild relief.      Review of Systems  HENT: Positive for congestion, ear pain, hoarse voice and trouble swallowing.   Respiratory: Negative for cough.   Neurological: Positive for headaches.  All other systems reviewed and are negative.      Objective:   Physical Exam  Constitutional: She is oriented to person, place, and time. She appears well-developed and well-nourished. No distress.  HENT:  Head: Normocephalic and atraumatic.  Right Ear: External ear normal.  Nose: Mucosal edema and rhinorrhea present.  Mouth/Throat: Posterior oropharyngeal edema and posterior oropharyngeal erythema present. Tonsils are 3+ on the right.  Eyes: Pupils are equal, round, and reactive to light.  Neck: Normal range of motion. Neck supple. No thyromegaly present.  Cardiovascular: Normal rate, regular rhythm, normal heart sounds and intact distal pulses.  No murmur heard. Pulmonary/Chest: Effort normal and breath sounds normal. No respiratory distress. She has no wheezes.  Abdominal: Soft. Bowel sounds are normal. She exhibits no distension. There is no tenderness.  Musculoskeletal: Normal range of motion. She exhibits no edema or tenderness.  Neurological: She is alert and oriented to person, place, and time. She  has normal reflexes. No cranial nerve deficit.  Skin: Skin is warm and dry.  Psychiatric: She has a normal mood and affect. Her behavior is normal. Judgment and thought content normal.  Vitals reviewed.     BP 130/87   Pulse 86   Temp 98.6 F (37 C) (Oral)   Ht 5\' 4"  (1.626 m)   Wt 193 lb 12.8 oz (87.9 kg)   BMI 33.27 kg/m      Assessment & Plan:  Kyung RuddKennedy was seen today for sore throat.  Diagnoses and all orders for this visit:  Sore throat -     Cancel: Rapid Strep Screen ([facility] ONLY)  Strep throat -     amoxicillin (AMOXIL) 400 MG/5ML suspension; Take 6.3 mLs (500 mg total) by mouth 2 (two) times daily.  - Take meds as prescribed - Use a cool mist humidifier  -Use saline nose sprays frequently -Force fluids -For any cough or congestion  Use plain Mucinex- regular strength or max strength is fine -For fever or aces or pains- take tylenol or ibuprofen. -Throat lozenges if help -New toothbrush in 3 days   Jannifer Rodneyhristy Daijon Wenke, FNP

## 2017-08-21 NOTE — Patient Instructions (Signed)
Strep Throat Strep throat is a bacterial infection of the throat. Your health care provider may call the infection tonsillitis or pharyngitis, depending on whether there is swelling in the tonsils or at the back of the throat. Strep throat is most common during the cold months of the year in children who are 5-23 years of age, but it can happen during any season in people of any age. This infection is spread from person to person (contagious) through coughing, sneezing, or close contact. What are the causes? Strep throat is caused by the bacteria called Streptococcus pyogenes. What increases the risk? This condition is more likely to develop in:  People who spend time in crowded places where the infection can spread easily.  People who have close contact with someone who has strep throat.  What are the signs or symptoms? Symptoms of this condition include:  Fever or chills.  Redness, swelling, or pain in the tonsils or throat.  Pain or difficulty when swallowing.  White or yellow spots on the tonsils or throat.  Swollen, tender glands in the neck or under the jaw.  Red rash all over the body (rare).  How is this diagnosed? This condition is diagnosed by performing a rapid strep test or by taking a swab of your throat (throat culture test). Results from a rapid strep test are usually ready in a few minutes, but throat culture test results are available after one or two days. How is this treated? This condition is treated with antibiotic medicine. Follow these instructions at home: Medicines  Take over-the-counter and prescription medicines only as told by your health care provider.  Take your antibiotic as told by your health care provider. Do not stop taking the antibiotic even if you start to feel better.  Have family members who also have a sore throat or fever tested for strep throat. They may need antibiotics if they have the strep infection. Eating and drinking  Do not  share food, drinking cups, or personal items that could cause the infection to spread to other people.  If swallowing is difficult, try eating soft foods until your sore throat feels better.  Drink enough fluid to keep your urine clear or pale yellow. General instructions  Gargle with a salt-water mixture 3-4 times per day or as needed. To make a salt-water mixture, completely dissolve -1 tsp of salt in 1 cup of warm water.  Make sure that all household members wash their hands well.  Get plenty of rest.  Stay home from school or work until you have been taking antibiotics for 24 hours.  Keep all follow-up visits as told by your health care provider. This is important. Contact a health care provider if:  The glands in your neck continue to get bigger.  You develop a rash, cough, or earache.  You cough up a thick liquid that is green, yellow-brown, or bloody.  You have pain or discomfort that does not get better with medicine.  Your problems seem to be getting worse rather than better.  You have a fever. Get help right away if:  You have new symptoms, such as vomiting, severe headache, stiff or painful neck, chest pain, or shortness of breath.  You have severe throat pain, drooling, or changes in your voice.  You have swelling of the neck, or the skin on the neck becomes red and tender.  You have signs of dehydration, such as fatigue, dry mouth, and decreased urination.  You become increasingly sleepy, or   you cannot wake up completely.  Your joints become red or painful. This information is not intended to replace advice given to you by your health care provider. Make sure you discuss any questions you have with your health care provider. Document Released: 01/14/2000 Document Revised: 09/15/2015 Document Reviewed: 05/11/2014 Elsevier Interactive Patient Education  2018 [employer] Inc.  

## 2017-08-22 ENCOUNTER — Telehealth: Payer: Self-pay | Admitting: Nurse Practitioner

## 2017-08-22 NOTE — Telephone Encounter (Signed)
PT is wanting to speak to nurse about her strep throat says that it looks worse.

## 2017-08-22 NOTE — Telephone Encounter (Signed)
Patient called stating that it seems her throat is worse today and she is concerned that she may have chlamydia in her throat. She had a friend with similar symptoms and she is concerned that's what it is. Advised that we could do a throat culture to make sure but she is self pay and doesn;t have the money to come in. Wants to know if antibiotic can be changed to cover for chlamydia just in case that's what it is. Please advise

## 2017-08-23 MED ORDER — AZITHROMYCIN 500 MG PO TABS: 1000.0000 mg | ORAL_TABLET | Freq: Every day | ORAL | 0 refills | Status: DC

## 2017-08-23 NOTE — Telephone Encounter (Signed)
Azithromycin 1g rx sent to pharmacy.

## 2017-08-23 NOTE — Telephone Encounter (Signed)
Pt notified of RX 

## 2017-09-11 ENCOUNTER — Telehealth: Payer: Self-pay | Admitting: *Deleted

## 2017-09-11 NOTE — Telephone Encounter (Signed)
Will hav eto be seen to get antibiotic

## 2017-09-11 NOTE — Telephone Encounter (Signed)
Received phone call from mother stating that patient is not feeling well again and is having a sore throat, fatigue,overall not feeling well.  Patient is to go on Vacation Thursday and would like for something to be sent to pharmacy

## 2017-11-05 ENCOUNTER — Other Ambulatory Visit: Payer: Self-pay | Admitting: Family

## 2017-11-05 DIAGNOSIS — F429 Obsessive-compulsive disorder, unspecified: Secondary | ICD-10-CM

## 2017-11-05 DIAGNOSIS — F411 Generalized anxiety disorder: Secondary | ICD-10-CM

## 2017-11-05 NOTE — Telephone Encounter (Signed)
Last seen 08/21/17  MMM 

## 2017-11-06 ENCOUNTER — Other Ambulatory Visit: Payer: Self-pay | Admitting: Family

## 2017-11-06 DIAGNOSIS — F411 Generalized anxiety disorder: Secondary | ICD-10-CM

## 2017-11-06 DIAGNOSIS — F429 Obsessive-compulsive disorder, unspecified: Secondary | ICD-10-CM

## 2017-11-07 NOTE — Telephone Encounter (Signed)
Last seen 08/21/17  MMM 

## 2017-12-12 ENCOUNTER — Ambulatory Visit (INDEPENDENT_AMBULATORY_CARE_PROVIDER_SITE_OTHER): Payer: Self-pay | Admitting: Family Medicine

## 2017-12-12 ENCOUNTER — Encounter: Payer: Self-pay | Admitting: Family Medicine

## 2017-12-12 VITALS — BP 134/94 | HR 86 | Temp 98.2°F | Ht 64.0 in | Wt 189.0 lb

## 2017-12-12 DIAGNOSIS — L089 Local infection of the skin and subcutaneous tissue, unspecified: Secondary | ICD-10-CM

## 2017-12-12 DIAGNOSIS — F411 Generalized anxiety disorder: Secondary | ICD-10-CM

## 2017-12-12 DIAGNOSIS — F429 Obsessive-compulsive disorder, unspecified: Secondary | ICD-10-CM

## 2017-12-12 MED ORDER — FLUOXETINE HCL 40 MG PO CAPS: 40.0000 mg | ORAL_CAPSULE | Freq: Every day | ORAL | 0 refills | Status: DC

## 2017-12-12 MED ORDER — DICLOFENAC SODIUM 75 MG PO TBEC: 75.0000 mg | DELAYED_RELEASE_TABLET | Freq: Two times a day (BID) | ORAL | 0 refills | Status: DC

## 2017-12-12 MED ORDER — DOXYCYCLINE HYCLATE 100 MG PO TABS: 100.0000 mg | ORAL_TABLET | Freq: Two times a day (BID) | ORAL | 0 refills | Status: DC

## 2017-12-12 NOTE — Progress Notes (Signed)
Subjective: CC: knee problem PCP: Bennie Pierini, FNP ZOX:[id number] Tonya Franklin is a 22 y.o. female presenting to clinic today for:  1. Knee problem Patient reports a sore on the left knee that developed over the last couple of days.  It has gradually gotten much larger and actually started out as a small pimple.  She reports associated pain, redness and swelling.  She has not used anything for the symptoms.  She is concerned for MRSA but denies any history of MRSA infection.  She works at [employer] and states that the pain seems worse with walking around and standing on the leg.   ROS: Per HPI  Allergies  Allergen Reactions  . Cefuroxime Axetil Other (See Comments)    Unknown   Past Medical History:  Diagnosis Date  . Asthma   . Bipolar disorder (HCC)   . Mood swings 2014    Current Outpatient Medications:  .  FLUoxetine (PROZAC) 20 MG capsule, TAKE 1 CAPSULE BY MOUTH ONCE DAILY, Disp: 90 capsule, Rfl: 0 .  levonorgestrel (MIRENA, 52 MG,) 20 MCG/24HR IUD, by Intrauterine route., Disp: , Rfl:  Social History   Socioeconomic History  . Marital status: Single    Spouse name: Not on file  . Number of children: Not on file  . Years of education: Not on file  . Highest education level: Not on file  Occupational History  . Not on file  Social Needs  . Financial resource strain: Not on file  . Food insecurity:    Worry: Not on file    Inability: Not on file  . Transportation needs:    Medical: Not on file    Non-medical: Not on file  Tobacco Use  . Smoking status: Former Smoker    Types: Cigarettes    Last attempt to quit: 03/19/2014    Years since quitting: 3.7  . Smokeless tobacco: Never Used  Substance and Sexual Activity  . Alcohol use: No    Comment: occ  . Drug use: No    Comment: occ  . Sexual activity: Yes    Birth control/protection: Condom  Lifestyle  . Physical activity:    Days per week: Not on file    Minutes per session: Not on file  . Stress: Not  on file  Relationships  . Social connections:    Talks on phone: Not on file    Gets together: Not on file    Attends religious service: Not on file    Active member of club or organization: Not on file    Attends meetings of clubs or organizations: Not on file    Relationship status: Not on file  . Intimate partner violence:    Fear of current or ex partner: Not on file    Emotionally abused: Not on file    Physically abused: Not on file    Forced sexual activity: Not on file  Other Topics Concern  . Not on file  Social History Narrative  . Not on file   Family History  Problem Relation Age of Onset  . Diabetes Father   . Hyperlipidemia Father   . Hypertension Father   . Cancer Paternal Grandmother        lung  . Cancer Other        breast  . Cancer Other        ovarian and cervical    Objective: Office vital signs reviewed. BP (!) 134/94   Pulse 86   Temp 98.2  F (36.8 C) (Oral)   Ht 5\' 4"  (1.626 m)   Wt 189 lb (85.7 kg)   BMI 32.44 kg/m   Physical Examination:  General: Awake, alert, well nourished, nontoxic. No acute distress Skin: Patient has a 3 mm well-circumscribed vesicle appreciated on the anterior aspect of the left knee near the patella.  There is surrounding erythema, increased warmth.  She has a much smaller pustule appreciated on the right lateral thigh. MSK: Patient has full active range of motion of left knee.  No tenderness to palpation to the joint line or posterior popliteal fossa.  Assessment/ Plan: 22 y.o. female   1. Skin infection Will start doxycycline p.o. twice daily to cover for any possible staphylococcal component, especially since there appears to be purulent material within the vesicle.  Because of the affected area, we did discuss risk for joint involvement.  At this time, I do not suspect any septic joint but we did discuss signs and symptoms of septic joint and I advised her to seek immediate medical attention in the emergency  department should she develop any signs or symptoms.  Voltaren 75 p.o. twice daily as needed pain also prescribed.  Home care instructions were reviewed with the patient.  She will follow-up in 2 days if symptoms are not significantly improving.  2. GAD (generalized anxiety disorder) During the visit, while we were going over the AVS, patient was tearful and expressing that she was having worsening anxiety symptoms.  She was to increase her Prozac.  This was sent into the pharmacy.  She is to follow-up with her PCP in the next 2 to 4 weeks for recheck. - FLUoxetine (PROZAC) 40 MG capsule; Take 1 capsule (40 mg total) by mouth daily.  Dispense: 30 capsule; Refill: 0  3. Obsessive-compulsive disorder, unspecified type - FLUoxetine (PROZAC) 40 MG capsule; Take 1 capsule (40 mg total) by mouth daily.  Dispense: 30 capsule; Refill: 0   No orders of the defined types were placed in this encounter.  Meds ordered this encounter  Medications  . doxycycline (VIBRA-TABS) 100 MG tablet    Sig: Take 1 tablet (100 mg total) by mouth 2 (two) times daily.    Dispense:  20 tablet    Refill:  0  . diclofenac (VOLTAREN) 75 MG EC tablet    Sig: Take 1 tablet (75 mg total) by mouth 2 (two) times daily. If needed for pain    Dispense:  30 tablet    Refill:  0  . FLUoxetine (PROZAC) 40 MG capsule    Sig: Take 1 capsule (40 mg total) by mouth daily.    Dispense:  30 capsule    Refill:  0     [place] Mielke Hulen SkainsM Ledonna Dormer, DO [facility] [phone]

## 2017-12-12 NOTE — Patient Instructions (Signed)
I have prescribed you doxycycline to take twice a day with food for the next 10 days to cover for staph infection in your skin.  Keep the area covered.  I have also prescribed you diclofenac to take up to twice daily if needed for pain and inflammation.  Again, it is important that you take this with food.  If you feel that the symptoms are not improving after being on antibiotics for 48 hours or if they acutely worsen within the next 48 hours, please seek immediate medical attention.  You have prescribed a nonsteroidal anti-inflammatory drug (NSAID) today. This will help with your pain and inflammation. Please do not take any other NSAIDs (ibuprofen/Motrin/Advil, naproxen/Aleve, meloxicam/Mobic, Voltaren/diclofenac). Please make sure to eat a meal when taking this medication.   Caution:  If you have a history of acid reflux/indigestion, I recommend that you take an antacid (such as Prilosec, Prevacid) daily while on the NSAID.  If you have a history of bleeding disorder, gastric ulcer, are on a blood thinner (like warfarin/Coumadin, Xarelto, Eliquis, etc) please do not take NSAID.  If you have ever had a heart attack, you should not take NSAIDs.   Cellulitis, Adult Cellulitis is a skin infection. The infected area is usually red and tender. This condition occurs most often in the arms and lower legs. The infection can travel to the muscles, blood, and underlying tissue and become serious. It is very important to get treated for this condition. What are the causes? Cellulitis is caused by bacteria. The bacteria enter through a break in the skin, such as a cut, burn, insect bite, open sore, or crack. What increases the risk? This condition is more likely to occur in people who:  Have a weak defense system (immune system).  Have open wounds on the skin such as cuts, burns, bites, and scrapes. Bacteria can enter the body through these open wounds.  Are older.  Have diabetes.  Have a type  of long-lasting (chronic) liver disease (cirrhosis) or kidney disease.  Use IV drugs.  What are the signs or symptoms? Symptoms of this condition include:  Redness, streaking, or spotting on the skin.  Swollen area of the skin.  Tenderness or pain when an area of the skin is touched.  Warm skin.  Fever.  Chills.  Blisters.  How is this diagnosed? This condition is diagnosed based on a medical history and physical exam. You may also have tests, including:  Blood tests.  Lab tests.  Imaging tests.  How is this treated? Treatment for this condition may include:  Medicines, such as antibiotic medicines or antihistamines.  Supportive care, such as rest and application of cold or warm cloths (cold or warm compresses) to the skin.  Hospital care, if the condition is severe.  The infection usually gets better within 1-2 days of treatment. Follow these instructions at home:  Take over-the-counter and prescription medicines only as told by your health care provider.  If you were prescribed an antibiotic medicine, take it as told by your health care provider. Do not stop taking the antibiotic even if you start to feel better.  Drink enough fluid to keep your urine clear or pale yellow.  Do not touch or rub the infected area.  Raise (elevate) the infected area above the level of your heart while you are sitting or lying down.  Apply warm or cold compresses to the affected area as told by your health care provider.  Keep all follow-up visits as told  by your health care provider. This is important. These visits let your health care provider make sure a more serious infection is not developing. Contact a health care provider if:  You have a fever.  Your symptoms do not improve within 1-2 days of starting treatment.  Your bone or joint underneath the infected area becomes painful after the skin has healed.  Your infection returns in the same area or another area.  You  notice a swollen bump in the infected area.  You develop new symptoms.  You have a general ill feeling (malaise) with muscle aches and pains. Get help right away if:  Your symptoms get worse.  You feel very sleepy.  You develop vomiting or diarrhea that persists.  You notice red streaks coming from the infected area.  Your red area gets larger or turns dark in color. This information is not intended to replace advice given to you by your health care provider. Make sure you discuss any questions you have with your health care provider. Document Released: 10/26/2004 Document Revised: 05/27/2015 Document Reviewed: 11/25/2014 Elsevier Interactive Patient Education  [employer].

## 2018-01-10 ENCOUNTER — Ambulatory Visit: Payer: Self-pay | Admitting: Family

## 2018-01-15 ENCOUNTER — Encounter: Payer: Self-pay | Admitting: Nurse Practitioner

## 2018-05-07 ENCOUNTER — Other Ambulatory Visit: Payer: Self-pay

## 2018-05-07 ENCOUNTER — Ambulatory Visit (INDEPENDENT_AMBULATORY_CARE_PROVIDER_SITE_OTHER): Payer: Self-pay | Admitting: Family

## 2018-05-07 ENCOUNTER — Encounter: Payer: Self-pay | Admitting: Family

## 2018-05-07 DIAGNOSIS — A749 Chlamydial infection, unspecified: Secondary | ICD-10-CM

## 2018-05-07 DIAGNOSIS — F411 Generalized anxiety disorder: Secondary | ICD-10-CM

## 2018-05-07 DIAGNOSIS — F1010 Alcohol abuse, uncomplicated: Secondary | ICD-10-CM

## 2018-05-07 MED ORDER — BUSPIRONE HCL 15 MG PO TABS: 15.0000 mg | ORAL_TABLET | Freq: Three times a day (TID) | ORAL | 2 refills | Status: DC | PRN

## 2018-05-07 MED ORDER — AZITHROMYCIN 500 MG PO TABS: 1000.0000 mg | ORAL_TABLET | Freq: Every day | ORAL | 0 refills | Status: DC

## 2018-05-07 NOTE — Progress Notes (Signed)
   Virtual Visit via telephone Note  I connected with Tonya Franklin on 05/07/18 at 2:44 pm by telephone and verified that I am speaking with the correct person using two identifiers. Tonya Franklin is currently located at home and no one is currently with her during visit. The provider, [name], FNP is located in their office at time of visit.  I discussed the limitations, risks, security and privacy concerns of performing an evaluation and management service by telephone and the availability of in person appointments. I also discussed with the patient that there may be a patient responsible charge related to this service. The patient expressed understanding and agreed to proceed.   History and Present Illness:   Pt calls today with increased anxiety. She states she is heavy drinker and reports drinking shots as soon as she gets off work until she "passes out". She has started trying to cut back which has caused increase her in anxiety and mood. She continues to take the Prozac 40 mg daily.   PT states she tested positive for Chlamydia 02/13/18. She completed the azithromycin. She states the guy that gave it to her stated he had "got his fixed" she states she slept with him about two weeks ago. She then found out he had not been to the doctor. She reports she having vaginal discharge with an odor about a week ago.  Anxiety  Presents for follow-up visit. Symptoms include decreased concentration, depressed mood, excessive worry, irritability, nervous/anxious behavior and restlessness. Symptoms occur occasionally. The severity of symptoms is moderate. The quality of sleep is good.        Review of Systems  Constitutional: Positive for irritability.  Psychiatric/Behavioral: Positive for decreased concentration. The patient is nervous/anxious.        Observations/Objective: No SOB or distress noted  Assessment and Plan: 1. GAD (generalized anxiety disorder) Stress management  discussed Will do referral to Psychiatry  - busPIRone (BUSPAR) 15 MG tablet; Take 1 tablet (15 mg total) by mouth 3 (three) times daily as needed.  Dispense: 90 tablet; Refill: 2 - Ambulatory referral to Psychiatry  2. Alcohol abuse Slowly tamper off. No not stop cold [place] Will do referral to Psychiatry to see if they can help - Ambulatory referral to Psychiatry  3. Chlamydia infection Will given Azithromycin 1000 mg today Needs to repeat in 3 months       I discussed the assessment and treatment plan with the patient. The patient was provided an opportunity to ask questions and all were answered. The patient agreed with the plan and demonstrated an understanding of the instructions.   The patient was advised to call back or seek an in-person evaluation if the symptoms worsen or if the condition fails to improve as anticipated.  The above assessment and management plan was discussed with the patient. The patient verbalized understanding of and has agreed to the management plan. Patient is aware to call the clinic if symptoms persist or worsen. Patient is aware when to return to the clinic for a follow-up visit. Patient educated on when it is appropriate to go to the emergency department.    Call ended 3:08pm, I provided 24 minutes of non-face-to-face time during this encounter.    [name], FNP

## 2018-05-09 ENCOUNTER — Ambulatory Visit: Payer: Self-pay | Admitting: Nurse Practitioner

## 2018-05-27 ENCOUNTER — Ambulatory Visit (INDEPENDENT_AMBULATORY_CARE_PROVIDER_SITE_OTHER): Payer: Self-pay | Admitting: Family Medicine

## 2018-05-27 ENCOUNTER — Ambulatory Visit (HOSPITAL_COMMUNITY): Payer: BLUE CROSS/BLUE SHIELD

## 2018-05-27 ENCOUNTER — Encounter (HOSPITAL_COMMUNITY): Payer: Self-pay | Admitting: Emergency Medicine

## 2018-05-27 ENCOUNTER — Other Ambulatory Visit: Payer: Self-pay

## 2018-05-27 ENCOUNTER — Emergency Department (HOSPITAL_COMMUNITY)
Admission: EM | Admit: 2018-05-27 | Discharge: 2018-05-27 | Disposition: A | Discharge status: Home / Self Care | Payer: BLUE CROSS/BLUE SHIELD | Attending: Emergency Medicine | Admitting: Emergency Medicine

## 2018-05-27 DIAGNOSIS — R197 Diarrhea, unspecified: Secondary | ICD-10-CM

## 2018-05-27 DIAGNOSIS — J45909 Unspecified asthma, uncomplicated: Secondary | ICD-10-CM | POA: Diagnosis not present

## 2018-05-27 DIAGNOSIS — Z87891 Personal history of nicotine dependence: Secondary | ICD-10-CM | POA: Insufficient documentation

## 2018-05-27 DIAGNOSIS — R1011 Right upper quadrant pain: Secondary | ICD-10-CM

## 2018-05-27 DIAGNOSIS — Z79899 Other long term (current) drug therapy: Secondary | ICD-10-CM | POA: Diagnosis not present

## 2018-05-27 DIAGNOSIS — E860 Dehydration: Secondary | ICD-10-CM | POA: Insufficient documentation

## 2018-05-27 LAB — COMPREHENSIVE METABOLIC PANEL
ALT: 32 U/L (ref 0–44)
AST: 27 U/L (ref 15–41)
Albumin: 3.9 g/dL (ref 3.5–5.0)
Alkaline Phosphatase: 45 U/L (ref 38–126)
Anion gap: 11 (ref 5–15)
BUN: 7 mg/dL (ref 6–20)
CO2: 24 mmol/L (ref 22–32)
Calcium: 8.9 mg/dL (ref 8.9–10.3)
Chloride: 103 mmol/L (ref 98–111)
Creatinine, Ser: 0.86 mg/dL (ref 0.44–1.00)
GFR calc Af Amer: >60 mL/min (ref >60)
GFR calc non Af Amer: >60 mL/min (ref >60)
Glucose, Bld: 98 mg/dL (ref 70–99)
Potassium: 3.7 mmol/L (ref 3.5–5.1)
Sodium: 138 mmol/L (ref 135–145)
Total Bilirubin: 0.6 mg/dL (ref 0.3–1.2)
Total Protein: 6.3 g/dL — ABNORMAL LOW (ref 6.5–8.1)

## 2018-05-27 LAB — CBC WITH DIFFERENTIAL/PLATELET
Abs Immature Granulocytes: 0.02 10*3/uL (ref 0.00–0.07)
Basophils Absolute: 0 10*3/uL (ref 0.0–0.1)
Basophils Relative: 0 %
Eosinophils Absolute: 0.1 10*3/uL (ref 0.0–0.5)
Eosinophils Relative: 1 %
HCT: 39.3 % (ref 36.0–46.0)
Hemoglobin: 13.2 g/dL (ref 12.0–15.0)
Immature Granulocytes: 0 %
Lymphocytes Relative: 36 %
Lymphs Abs: 2.5 10*3/uL (ref 0.7–4.0)
MCH: 31 pg (ref 26.0–34.0)
MCHC: 33.6 g/dL (ref 30.0–36.0)
MCV: 92.3 fL (ref 80.0–100.0)
Monocytes Absolute: 0.4 10*3/uL (ref 0.1–1.0)
Monocytes Relative: 6 %
Neutro Abs: 4.1 10*3/uL (ref 1.7–7.7)
Neutrophils Relative %: 57 %
Platelets: 229 10*3/uL (ref 150–400)
RBC: 4.26 MIL/uL (ref 3.87–5.11)
RDW: 12.6 % (ref 11.5–15.5)
WBC: 7.1 10*3/uL (ref 4.0–10.5)
nRBC: 0 % (ref 0.0–0.2)

## 2018-05-27 LAB — URINALYSIS, ROUTINE W REFLEX MICROSCOPIC
Bilirubin Urine: NEGATIVE
Glucose, UA: NEGATIVE mg/dL
Hgb urine dipstick: NEGATIVE
Ketones, ur: NEGATIVE mg/dL
Leukocytes,Ua: NEGATIVE
Nitrite: NEGATIVE
Protein, ur: NEGATIVE mg/dL
Specific Gravity, Urine: 1.018 (ref 1.005–1.030)
pH: 6 (ref 5.0–8.0)

## 2018-05-27 LAB — LIPASE, BLOOD: Lipase: 26 U/L (ref 11–51)

## 2018-05-27 MED ORDER — SODIUM CHLORIDE 0.9 % IV BOLUS
1000.0000 mL | Freq: Once | INTRAVENOUS | Status: AC
  Administered 2018-05-27: 13:00:00 1000 mL via INTRAVENOUS

## 2018-05-27 MED ORDER — ONDANSETRON HCL 4 MG/2ML IJ SOLN
4.0000 mg | Freq: Once | INTRAMUSCULAR | Status: AC
  Administered 2018-05-27: 4 mg via INTRAVENOUS
  Filled 2018-05-27: qty 2

## 2018-05-27 MED ORDER — LOPERAMIDE HCL 2 MG PO CAPS: 2.0000 mg | ORAL_CAPSULE | Freq: Four times a day (QID) | ORAL | 0 refills | Status: DC | PRN

## 2018-05-27 NOTE — ED Triage Notes (Signed)
Pt. Stated, Tonya Franklin had stomach pain and diarrhea for 2 months. Ive not seen my Dr.

## 2018-05-27 NOTE — ED Notes (Signed)
Got patient undress on the monitor patient is resting with call bell in reach 

## 2018-05-27 NOTE — Progress Notes (Signed)
Telephone visit  Subjective: CC: Stomach pain PCP: Bennie Pierini, FNP INO:[id number] Tonya Franklin is a 23 y.o. female calls for telephone consult today. Patient provides verbal consent for consult held via phone.  Location of patient: work Location of provider: Working remotely from home Others present for call: none  1. Stomachache Patient reports ongoing intermittent episodes of diarrhea for the last 2 to 3 months.  She notes that over the last several days she has been having 6-8 diarrheal stools per day with associated severe nausea but no vomiting.  She reports chills, abdominal bloating and upper abdominal pain.  Denies any hematochezia.  She had a temperature of 100 F when she went to her chiropractor yesterday.  She reports stools appear yellow and slimy.  No known sick contacts.  She is unsure if symptoms are associated with any specific foods but goes on to state that she feels nauseated all day long.  She is had some difficulty keeping fluids down including water.   ROS: Per HPI  Allergies  Allergen Reactions  . Cefuroxime Axetil Other (See Comments)    Unknown   Past Medical History:  Diagnosis Date  . Asthma   . Bipolar disorder (HCC)   . Mood swings 2014    Current Outpatient Medications:  .  busPIRone (BUSPAR) 15 MG tablet, Take 1 tablet (15 mg total) by mouth 3 (three) times daily as needed., Disp: 90 tablet, Rfl: 2 .  FLUoxetine (PROZAC) 40 MG capsule, Take 1 capsule (40 mg total) by mouth daily., Disp: 30 capsule, Rfl: 0 .  levonorgestrel (MIRENA, 52 MG,) 20 MCG/24HR IUD, by Intrauterine route., Disp: , Rfl:  .  phentermine (ADIPEX-P) 37.5 MG tablet, Take 37.5 mg by mouth daily., Disp: , Rfl:   Assessment/ Plan: 23 y.o. female   1. Right upper quadrant abdominal pain After further questioning, patient was able to palpate the right upper quadrant her abdomen and noted tenderness.  Because she has had questionable fevers, difficulty keeping fluids down and  may be at risk of dehydration I have recommend that she consider being evaluated in the emergency department.  Signs and symptoms are concerning for cholecystitis.  I will send a work note excusing her from today as well as that she may seek evaluation.  2. Diarrhea, unspecified type Possibly related to gallbladder.  Differential diagnosis includes IBS D.   Start time: 8:56am End time: 9:04am  Total time spent on patient care (including telephone call/ virtual visit): 15 minutes  Tonya Hellmann Hulen Skains, DO [facility] [phone]

## 2018-05-27 NOTE — Patient Instructions (Signed)
Cholecystitis ° °Cholecystitis is irritation and swelling (inflammation) of the gallbladder. The gallbladder is an organ that is shaped like a pear. It is under the liver on the right side of the body. This organ stores bile. Bile helps the body break down (digest) the fats in food. °This condition can occur all of a sudden. It needs to be treated. °What are the causes? °This condition may be caused by stones or lumps that form in the gallbladder (gallstones). Gallstones can block the tube (duct) that carries bile out of your gallbladder. °Other causes are: °· Damage to the gallbladder due to less blood flow. °· Germs in the bile ducts. °· Scars or kinks in the bile ducts. °· Abnormal growths (tumors) in the liver, pancreas, or gallbladder. °What increases the risk? °You are more likely to develop this condition if: °· You have sickle cell disease. °· You take birth control pills.  °· You use estrogen. °· You have alcoholic liver disease. °· You have liver cirrhosis. °· You are being fed through a vein. °· You are very ill. °· You do not eat or drink for a long time. This is also called "fasting." °· You are overweight (obese). °· You lose weight too fast. °· You are pregnant. °· You have high levels of fat in the blood (triglycerides). °· You have irritation and swelling of the pancreas (pancreatitis). °What are the signs or symptoms? °Symptoms of this condition include: °· Pain in the belly (abdomen). Pain is often in the upper right area of the belly. °· Tenderness or bloating in the belly. °· Feeling sick to your stomach (nauseous). °· Throwing up (vomiting). °· Fever. °· Chills. °How is this diagnosed? °This condition may be diagnosed with a medical history and exam. You may also have other tests, such as: °· Imaging tests. This may include: °? Ultrasound. °? CT scan of the belly. °? Nuclear scan. This is also called a HIDA scan. This scan lets your doctor see the bile as it moves in your body. °? MRI. °· Blood  tests. These are done to check: °? Your blood count. The white blood cell count may be higher than normal. °? How well your liver works. °How is this treated? °This condition may be treated with: °· Surgery to take out your gallbladder. °· Antibiotic medicines to treat illnesses caused by germs. °· Going without food for some time. °· Giving fluids through an IV tube. °· Medicines to treat pain or throwing up. °Follow these instructions at home: °· If you had surgery, follow instructions from your doctor about how to care for yourself after you go home. °Medicines ° °· Take over-the-counter and prescription medicines only as told by your doctor. °· If you were prescribed an antibiotic medicine, take it as told by your doctor. Do not stop taking it even if you start to feel better. °General instructions °· Follow instructions from your doctor about what to eat or drink. Do not eat or drink anything that makes you sick again. °· Do not lift anything that is heavier than 10 lb (4.5 kg) until your doctor says that it is safe. °· Do not use any products that contain nicotine or tobacco, such as cigarettes and e-cigarettes. If you need help quitting, ask your doctor. °· Keep all follow-up visits as told by your doctor. This is important. °Contact a doctor if: °· You have pain and your medicine does not help. °· You have a fever. °Get help right away if: °·   Your pain moves to: °? Another part of your belly. °? Your back. °· Your symptoms do not go away. °· You have new symptoms. °Summary °· Cholecystitis is swelling and irritation of the gallbladder. °· This condition may be caused by stones or lumps that form in the gallbladder (gallstones). °· Common symptoms are pain in the belly. You may feel sick to your stomach and start throwing up. You may also have a fever and chills. °· This condition may be treated with surgery to take out the gallbladder. It may also be treated with medicines, fasting, and fluids through an IV  tube. °· Follow what you are told about eating and drinking. Do not eat things that make you sick again. °This information is not intended to replace advice given to you by your health care provider. Make sure you discuss any questions you have with your health care provider. °Document Released: 01/05/2011 Document Revised: 05/25/2017 Document Reviewed: 05/25/2017 °Elsevier Interactive Patient Education © 2019 [employer] ° °

## 2018-05-27 NOTE — ED Provider Notes (Signed)
[facility] EMERGENCY DEPARTMENT Provider Note   CSN: [id number] Arrival date & time: 05/27/18  1032    History   Chief Complaint Chief Complaint  Patient presents with  . Abdominal Pain  . Diarrhea    HPI Tonya Franklin is a 23 y.o. female who presents to the ED complaining of intermittent RUQ pain and diarrhea x 2 months. Pt reports she has about 4-5 bowel movements per day and describes them as yellow/greasy in consistency. Pt is unable to say what makes her pain better or worse; she reports it comes and goes but has not been able to establish a pattern. She also complains of mild nausea and intermittent vomiting; no hematemesis or coffee ground emesis. She is concerned about her gallbladder at this point; extensive FHx related to cholelithasis/cholecystitis. Reports her cousin who is 59 years old just recently had to have her gallbladder removed. Pt had a telemedicine visit with her PCP today who suggested she come to the ED for further evaluation. Pt has not taken anything for her symptoms for the past 2 months. Denies fever, chills, constipation, melena, hematochezia, or any other associated symptoms.        Past Medical History:  Diagnosis Date  . Asthma   . Bipolar disorder (HCC)   . Mood swings 2014    Patient Active Problem List   Diagnosis Date Noted  . Obsessive-compulsive disorder 06/29/2017  . Alcohol abuse 06/29/2017  . Obesity (BMI 30-39.9) 06/29/2017  . 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 BV (bacterial vaginosis) 05/31/2015  . GAD (generalized anxiety disorder) 10/10/2012    Past Surgical History:  Procedure Laterality Date  . adnoids    . CYST EXCISION     cyst removed from face     OB History   No obstetric history on file.      Home Medications    Prior to Admission medications   Medication Sig Start Date End Date Taking? Authorizing Provider  busPIRone (BUSPAR) 15 MG tablet Take 1 tablet  (15 mg total) by mouth 3 (three) times daily as needed. 05/07/18  Yes Hawks, Christy A, FNP  FLUoxetine (PROZAC) 40 MG capsule Take 1 capsule (40 mg total) by mouth daily. 12/12/17  Yes Delynn Flavin M, DO  phentermine (ADIPEX-P) 37.5 MG tablet Take 37.5 mg by mouth daily. 04/10/18  Yes [provider]  levonorgestrel (MIRENA, 52 MG,) 20 MCG/24HR IUD by Intrauterine route.    [provider]  loperamide (IMODIUM) 2 MG capsule Take 1 capsule (2 mg total) by mouth 4 (four) times daily as needed for diarrhea or loose stools. 05/27/18   Tanda Rockers, PA-C    Family History Family History  Problem Relation Age of Onset  . Diabetes Father   . Hyperlipidemia Father   . Hypertension Father   . Cancer Paternal Grandmother        lung  . Cancer Other        breast  . Cancer Other        ovarian and cervical    Social History Social History   Tobacco Use  . Smoking status: Former Smoker    Types: Cigarettes    Last attempt to quit: 03/19/2014    Years since quitting: 4.1  . Smokeless tobacco: Never Used  Substance Use Topics  . Alcohol use: No    Comment: occ  . Drug use: No    Comment: occ     Allergies   Cefuroxime  axetil   Review of Systems Review of Systems  Constitutional: Negative for chills and fever.  HENT: Negative for congestion.   Eyes: Negative for redness.  Respiratory: Negative for cough and shortness of breath.   Cardiovascular: Negative for chest pain.  Gastrointestinal: Positive for abdominal pain, diarrhea, nausea and vomiting. Negative for blood in stool and constipation.  Genitourinary: Negative for dysuria, flank pain and frequency.  Musculoskeletal: Negative for myalgias.  Skin: Negative for rash.  Neurological: Negative for dizziness and light-headedness.     Physical Exam Updated Vital Signs BP (!) 157/114 (BP Location: Right Arm)   Pulse 77   Temp 98.1 F (36.7 C) (Oral)   Resp 20   Ht  (1.626 m)   Wt 81.6 kg    SpO2 100%   BMI 30.90 kg/m   Physical Exam Vitals signs and nursing note reviewed.  Constitutional:      Appearance: She is obese. She is not ill-appearing.  HENT:     Head: Normocephalic and atraumatic.  Eyes:     Conjunctiva/sclera: Conjunctivae normal.  Neck:     Musculoskeletal: Neck supple.  Cardiovascular:     Rate and Rhythm: Normal rate and regular rhythm.  Pulmonary:     Effort: Pulmonary effort is normal.     Breath sounds: Normal breath sounds.  Abdominal:     General: Abdomen is flat. Bowel sounds are normal.     Palpations: Abdomen is soft.     Tenderness: There is abdominal tenderness in the right upper quadrant. There is no right CVA tenderness, left CVA tenderness, guarding or rebound. Negative signs include Murphy's sign.     Comments: Mild tenderness to RUQ. Negative Murphy's sign. No rebound or guarding.   Skin:    General: Skin is warm and dry.  Neurological:     Mental Status: She is alert.      ED Treatments / Results  Labs (all labs ordered are listed, but only abnormal results are displayed) Labs Reviewed  COMPREHENSIVE METABOLIC PANEL - Abnormal; Notable for the following components:      Result Value   Total Protein 6.3 (*)    All other components within normal limits  LIPASE, BLOOD  CBC WITH DIFFERENTIAL/PLATELET  URINALYSIS, ROUTINE W REFLEX MICROSCOPIC  I-STAT BETA HCG BLOOD, ED ([facility], [facility], [facility] ONLY)    EKG None  Radiology US Abdomen Limited Ruq  Result Date: 05/27/2018 CLINICAL DATA:  Right upper quadrant pain 2 months. EXAM: ULTRASOUND ABDOMEN LIMITED RIGHT UPPER QUADRANT COMPARISON:  None. FINDINGS: Gallbladder: No gallstones or wall thickening visualized. No sonographic Murphy sign noted by sonographer. Common bile duct: Diameter: 1.7 mm. Liver: No focal lesion identified. Within normal limits in parenchymal echogenicity. Portal vein is patent on color Doppler imaging with normal direction of blood flow towards the liver. IMPRESSION:  Normal right upper quadrant ultrasound. Electronically Signed   By: Elberta Fortis M.D.   On: 05/27/2018 12:30    Procedures Procedures (including critical care time)  Medications Ordered in ED Medications  ondansetron (ZOFRAN) injection 4 mg (4 mg Intravenous Given 05/27/18 1305)  sodium chloride 0.9 % bolus 1,000 mL (0 mLs Intravenous Stopped 05/27/18 1450)     Initial Impression / Assessment and Plan / ED Course  I have reviewed the triage vital signs and the nursing notes.  Pertinent labs & imaging results that were available during my care of the patient were reviewed by me and considered in my medical decision making (see chart for details).  Pt is a 23 year old female who presents to the ED with 2 months of intermittent RUQ pain and diarrhea; occasional nausea and vomiting. Had telemedicine visit with her PCP today who suggested she come to the ED for further eval; concern for cholelithiasis vs -cystitis at this point. Pt has extensive family history of gallbladder disease; she is mildly tender with palpation although negative Murphy's sign. Will get baseline labs at this point including CBC, CMP, Lipase, U/A as well as RUQ U/S. Do not feel patient need CT imaging at this time; would like to reduce exposure to radiation. Pt afebrile in the ED. Will give nausea medications as well as IVF for symptomatic relief.   All labs unremarkable at this time; no leukocytosis to suggest infectious process; electrolytes within normal limits despite patient endorsing months of diarrhea with multiple episodes per day; lipase negative, can rule out pancreatitis. RUQ ultrasound without signs of cholelithiasis or cholecystitis. Will send patient home with GI follow up; do not feel this is an emergent abdominal pain case. Prescription for Imodium written for symptomatic relief. Patient is in clear understanding of plan and stable for discharge home.        Final Clinical Impressions(s) / ED Diagnoses    Final diagnoses:  RUQ abdominal pain  Diarrhea, unspecified type    ED Discharge Orders         Ordered    loperamide (IMODIUM) 2 MG capsule  4 times daily PRN     05/27/18 1417           Tanda RockersVenter, Radiance Deady, PA-C 05/27/18 1513    Benjiman CorePickering, Nathan, MD 05/27/18 1535

## 2018-05-27 NOTE — Discharge Instructions (Signed)
You were seen in the ED today for abdominal pain and diarrhea for the past 2 months; your workup was essentially negative in the ED today as well as a negative ultrasound of your gallbladder. Please follow up with GI regarding symptoms. Return to the ED for any worsening symptoms. Take the Imodium as prescribed.

## 2018-05-28 ENCOUNTER — Encounter: Payer: Self-pay | Admitting: Gastroenterology

## 2018-05-28 ENCOUNTER — Telehealth: Payer: Self-pay | Admitting: Nurse Practitioner

## 2018-05-28 ENCOUNTER — Ambulatory Visit (INDEPENDENT_AMBULATORY_CARE_PROVIDER_SITE_OTHER): Payer: BLUE CROSS/BLUE SHIELD | Admitting: Gastroenterology

## 2018-05-28 ENCOUNTER — Other Ambulatory Visit: Payer: Self-pay

## 2018-05-28 ENCOUNTER — Ambulatory Visit (INDEPENDENT_AMBULATORY_CARE_PROVIDER_SITE_OTHER): Payer: Self-pay | Admitting: Family

## 2018-05-28 ENCOUNTER — Encounter: Payer: Self-pay | Admitting: Family

## 2018-05-28 VITALS — Ht 64.0 in | Wt 180.0 lb

## 2018-05-28 DIAGNOSIS — I10 Essential (primary) hypertension: Secondary | ICD-10-CM

## 2018-05-28 DIAGNOSIS — R197 Diarrhea, unspecified: Secondary | ICD-10-CM

## 2018-05-28 DIAGNOSIS — Z09 Encounter for follow-up examination after completed treatment for conditions other than malignant neoplasm: Secondary | ICD-10-CM

## 2018-05-28 DIAGNOSIS — F1010 Alcohol abuse, uncomplicated: Secondary | ICD-10-CM

## 2018-05-28 HISTORY — DX: Essential (primary) hypertension: I10

## 2018-05-28 MED ORDER — LISINOPRIL 20 MG PO TABS: 20.0000 mg | ORAL_TABLET | Freq: Every day | ORAL | 3 refills | Status: DC

## 2018-05-28 NOTE — Progress Notes (Signed)
   Virtual Visit via telephone Note  I connected with Tonya Franklin on 05/28/18 at 11:12 AM by telephone and verified that I am speaking with the correct person using two identifiers. Tonya Franklin is currently located at home and mother is currently with her during visit. The provider, [name], FNP is located in their office at time of visit.  I discussed the limitations, risks, security and privacy concerns of performing an evaluation and management service by telephone and the availability of in person appointments. I also discussed with the patient that there may be a patient responsible charge related to this service. The patient expressed understanding and agreed to proceed.   History and Present Illness:   PT calls the office today with elevated blood pressure. She went to the ED yesterday with a BP of 157/114. She states she has taken her BP at home and it has been 149/100.  She went to the ED for diarrhea and had a negative [place] for cholecystitis. Normal WBC, Lipase. A GI referral was placed.   She does have a history of alcohol abuse and admits drinking "a lot" every day.  Hypertension  This is a new problem. The current episode started 1 to 4 weeks ago. The problem is unchanged. The problem is uncontrolled. Associated symptoms include peripheral edema (right ffot). Pertinent negatives include no malaise/fatigue or shortness of breath. Past treatments include nothing. The current treatment provides mild improvement.      Review of Systems  Constitutional: Negative for malaise/fatigue.  Respiratory: Negative for shortness of breath.   All other systems reviewed and are negative.    Observations/Objective: No SOB or distress noted  Assessment and Plan: Tonya Franklin comes in today with chief complaint of No chief complaint on file.   Diagnosis and orders addressed:  1. Essential hypertension Will start patient on lisinopril 20 mg today -Dash diet  information given -Exercise encouraged - Stress Management  -Continue current meds -RTO in 2 weeks  - lisinopril (ZESTRIL) 20 MG tablet; Take 1 tablet (20 mg total) by mouth daily.  Dispense: 90 tablet; Refill: 3  2. Alcohol abuse Tamper alcohol   3. Hospital discharge follow-up Notes and labs reviewed  4. Diarrhea, unspecified type Keep GI referral and Imodium as needed    Labs reviewed Health Maintenance reviewed Diet and exercise encouraged  Follow up plan: 2 weeks    I discussed the assessment and treatment plan with the patient. The patient was provided an opportunity to ask questions and all were answered. The patient agreed with the plan and demonstrated an understanding of the instructions.   The patient was advised to call back or seek an in-person evaluation if the symptoms worsen or if the condition fails to improve as anticipated.  The above assessment and management plan was discussed with the patient. The patient verbalized understanding of and has agreed to the management plan. Patient is aware to call the clinic if symptoms persist or worsen. Patient is aware when to return to the clinic for a follow-up visit. Patient educated on when it is appropriate to go to the emergency department.    Call ended 11:24 AM, I provided 12 minutes of non-face-to-face time during this encounter.    [name], FNP

## 2018-05-29 ENCOUNTER — Ambulatory Visit (HOSPITAL_COMMUNITY): Payer: BLUE CROSS/BLUE SHIELD | Admitting: Psychiatry

## 2018-05-29 ENCOUNTER — Other Ambulatory Visit: Payer: Self-pay

## 2018-05-30 ENCOUNTER — Encounter: Payer: Self-pay | Admitting: Gastroenterology

## 2018-05-30 ENCOUNTER — Other Ambulatory Visit: Payer: Self-pay

## 2018-05-30 ENCOUNTER — Telehealth: Payer: Self-pay | Admitting: Gastroenterology

## 2018-05-30 ENCOUNTER — Ambulatory Visit (INDEPENDENT_AMBULATORY_CARE_PROVIDER_SITE_OTHER): Payer: BLUE CROSS/BLUE SHIELD | Admitting: Gastroenterology

## 2018-05-30 VITALS — Ht 64.0 in | Wt 180.0 lb

## 2018-05-30 DIAGNOSIS — R197 Diarrhea, unspecified: Secondary | ICD-10-CM | POA: Diagnosis not present

## 2018-05-30 MED ORDER — LOPERAMIDE HCL 2 MG PO TABS: 2.0000 mg | ORAL_TABLET | Freq: Four times a day (QID) | ORAL | 0 refills | Status: DC | PRN

## 2018-05-30 NOTE — Patient Instructions (Addendum)
I have recommended several stool and blood tests to evaluate your diarrhea. You can have these done at the office any time Monday-Friday 8am-4pm. You do not have to fast.   Try to eat a little food. Good choices are potatoes, noodles, rice, oatmeal, crackers, bananas, soup, and boiled vegetables.  Avoid high fat foods, as they can make diarrhea worse.   Dairy products (except yogurt) may be difficult to digest when you have diarrhea. I recommend that you temporarily avoid lactose-containing foods.   I recommend a trial of loperamide 4 mg initially, then 2 mg after each unformed stool for ?2 days, with a maximum of 16 mg/day.  If that is not working, you could try bismuth salicylate (Pepto-Bismol) 30 mL or two tablets every 30 minutes for eight doses.  I have recommended a HIDA scan to evaluate your gallbladder. (Please call us back with your availability so that we can get it scheduled for you)  If the labs, stool tests, and HIDA scan are normal, we will schedule a colonoscopy and upper endoscopy.   Call with any concerns prior to the results being available.  Thank you for your patience with me and our technology today! Please stay home, safe, and healthy. I look forward to meeting you in person in the future.

## 2018-05-30 NOTE — Telephone Encounter (Signed)
Spoke with patient and informed her when to come for labwork and that we will call her back to schedule HIDA scan once covid19 restrictions are lifted, hopefully in the next couple of weeks. She verbalized understanding.

## 2018-05-30 NOTE — Progress Notes (Signed)
TELEHEALTH VISIT  Referring Provider: Chevis Pretty, * Primary Care Physician:  Chevis Pretty, FNP   Tele-visit due to COVID-19 pandemic Patient requested visit virtually, consented to the virtual encounter via video enabled telemedicine application (Zoom) Contact made at: 3:00 05/30/18 Patient verified by name and date of birth Location of patient: Home Location provider: [facility] Names of persons participating: Me, patient, Tinnie Gens CMA Time spent on telehealth visit: 28 minutes I discussed the limitations of evaluation and management by telemedicine. The patient expressed understanding and agreed to proceed.  Reason for Consultation: Nausea, vomiting, and diarrhea   IMPRESSION:  Diarrhea with associated abdominal pain x 3-4 months    - normal RUQ ultrasound 05/27/18    - normal lfts and lipase 05/27/18 Intermittent painless rectal bleeding Strong family history of symptomatic gallbladder disease  The differential diagnosis of chronic diarrhea without alarm features includes: irritable bowel syndrome, IBD, celiac disease, missed infection (such as giardia), food intolerance, microscopic colitis, thyroid disorder, bacterial overgrowth, other functional GI disease.  The patient and her family are most concerned about symptomatic gallbladder disease.    PLAN: - HIDA scan with CCK - Fecal calprotectin, ESR, and CRP to screen for IBD - Giardia testing - TSH - Trial of loperamide - EGD with duodenal biopsies and colonoscopy with random biopsies and evaluation of the terminal ileum if the evaluation above is nondiagnostic  I consented the patient at the bedside today discussing the risks, benefits, and alternatives to endoscopic evaluation. In particular, we discussed the risks that include, but are not limited to, reaction to medication, cardiopulmonary compromise, bleedin   HPI: Tonya Franklin is a 23 y.o. female referred by NP Hassell Done for  further evaluation of diarrhea. The history is obtained through the patient and review of her electronic health record.   Reports 3-4 months of 8+ BM daily. Associated midupper abdominal pain relieved by defecation. Exacerbated by eating with post-prandial defecation within 5 minutes. Associated nausea. Associated blood on the toilet paper. Intermittently blood in the stool. She thinks she has hemorrhoids. Intermittent mucous in the stool. Poor energy. Good appetite. Gaining weight. No fevers, chills, night sweats. No known sick contacts. No well water. No known thyroid dysfunction. Denies a precipitating event, trauma, close contacts with similar symptoms, changes in diet, recent travel or antibiotic use. No other associated symptoms. No identified exacerbating or relieving features.   Recent evaluation in the ED for diarrhea.  A RUQ abdominal ultrasound 05/27/18 was normal.  Labs were normal including a CMP, CBC, and lipase.  She drinks alcohol daily.  Grandfather with constipation ended up with a ruptured gallbladder that he did not survive. Multiple family members on her Mom's side with symptomatic gallbladder disease. The patent's Mom is concerned that it could be gallbladder.   No known family history of colon cancer or polyps. No family history of uterine/endometrial cancer, pancreatic cancer or gastric/stomach cancer.  Past Medical History:  Diagnosis Date  . Asthma   . Bipolar disorder ([place])   . Mood swings 2014    Past Surgical History:  Procedure Laterality Date  . adnoids    . CYST EXCISION     cyst removed from face    Current Outpatient Medications  Medication Sig Dispense Refill  . busPIRone (BUSPAR) 15 MG tablet Take 1 tablet (15 mg total) by mouth 3 (three) times daily as needed. 90 tablet 2  . FLUoxetine (PROZAC) 40 MG capsule Take 1 capsule (40 mg total) by mouth daily. [place]  capsule 0  . levonorgestrel (MIRENA, 52 MG,) 20 MCG/24HR IUD by Intrauterine route.    Marland Kitchen  lisinopril (ZESTRIL) 20 MG tablet Take 1 tablet (20 mg total) by mouth daily. 90 tablet 3  . loperamide (IMODIUM) 2 MG capsule Take 1 capsule (2 mg total) by mouth 4 (four) times daily as needed for diarrhea or loose stools. 12 capsule 0   No current facility-administered medications for this visit.     Allergies as of 05/30/2018 - Review Complete 05/30/2018  Allergen Reaction Noted  . Cefuroxime axetil Other (See Comments) 10/23/2016    Family History  Problem Relation Age of Onset  . Diabetes Father   . Hyperlipidemia Father   . Hypertension Father   . Cancer Paternal Grandmother        lung  . Cancer Other        breast  . Cancer Other        ovarian and cervical    Social History   Socioeconomic History  . Marital status: Single    Spouse name: Not on file  . Number of children: Not on file  . Years of education: Not on file  . Highest education level: Not on file  Occupational History  . Not on file  Social Needs  . Financial resource strain: Not on file  . Food insecurity:    Worry: Not on file    Inability: Not on file  . Transportation needs:    Medical: Not on file    Non-medical: Not on file  Tobacco Use  . Smoking status: Former Smoker    Types: Cigarettes    Last attempt to quit: 03/19/2014    Years since quitting: 4.2  . Smokeless tobacco: Never Used  Substance and Sexual Activity  . Alcohol use: No    Comment: occ  . Drug use: No    Comment: occ  . Sexual activity: Yes    Birth control/protection: Condom  Lifestyle  . Physical activity:    Days per week: Not on file    Minutes per session: Not on file  . Stress: Not on file  Relationships  . Social connections:    Talks on phone: Not on file    Gets together: Not on file    Attends religious service: Not on file    Active member of club or organization: Not on file    Attends meetings of clubs or organizations: Not on file    Relationship status: Not on file  . Intimate partner  violence:    Fear of current or ex partner: Not on file    Emotionally abused: Not on file    Physically abused: Not on file    Forced sexual activity: Not on file  Other Topics Concern  . Not on file  Social History Narrative  . Not on file    Review of Systems: ALL ROS discussed and all others negative except listed in HPI. Sees a chiropractor her her back. No extra GI manifestations of IBD.   Physical Exam: General: in no acute distress Neuro: Alert and appropriate Psych: Normal affect and normal insight   Tisheena Maguire L. Tarri Glenn, MD, MPH [facility] 05/30/2018, 1:04 PM

## 2018-06-11 ENCOUNTER — Ambulatory Visit: Payer: Self-pay | Admitting: Family

## 2018-06-14 ENCOUNTER — Other Ambulatory Visit: Payer: BLUE CROSS/BLUE SHIELD

## 2018-06-28 ENCOUNTER — Telehealth: Payer: Self-pay | Admitting: Nurse Practitioner

## 2018-07-26 ENCOUNTER — Other Ambulatory Visit: Payer: Self-pay

## 2018-07-26 ENCOUNTER — Ambulatory Visit (INDEPENDENT_AMBULATORY_CARE_PROVIDER_SITE_OTHER): Payer: BC Managed Care – PPO | Admitting: Family

## 2018-07-26 ENCOUNTER — Encounter: Payer: Self-pay | Admitting: Family

## 2018-07-26 DIAGNOSIS — R002 Palpitations: Secondary | ICD-10-CM | POA: Diagnosis not present

## 2018-07-26 NOTE — Progress Notes (Signed)
   Virtual Visit via telephone Note  I connected with Tonya Franklin on 07/26/18 at 3:10 pm by telephone and verified that I am speaking with the correct person using two identifiers. Tonya Franklin is currently located at work and no one is currently with her during visit. The provider, Evelina Dun, FNP is located in their office at time of visit.  I discussed the limitations, risks, security and privacy concerns of performing an evaluation and management service by telephone and the availability of in person appointments. I also discussed with the patient that there may be a patient responsible charge related to this service. The patient expressed understanding and agreed to proceed.   History and Present Illness:  PT calls the office today with complaints of palpitations. She states she use to do cocaine and has got palpitations before, but thought it was related to the cocaine. She states her recently she has not used any cocaine, but has noticed a feeling of her "heart skipping beats". When this comes she states it takes her breath away and can be days or weeks before she feels it again.   She does admit to alcohol abuse and states she usually  drinks every night.  Palpitations  This is a new problem. The current episode started today. The problem occurs intermittently. The problem has been waxing and waning. The symptoms are aggravated by caffeine, pseudoephedrine and alcohol. Associated symptoms include anxiety, an irregular heartbeat and shortness of breath. Pertinent negatives include no chest pain, coughing, dizziness, syncope, vomiting or weakness. She has tried bed rest and deep relaxation for the symptoms. The treatment provided moderate relief. Risk factors include smoking/tobacco exposure, stress and obesity. Her past medical history is significant for anxiety and drug use.      Review of Systems  Respiratory: Positive for shortness of breath. Negative for cough.    Cardiovascular: Positive for palpitations. Negative for chest pain and syncope.  Gastrointestinal: Negative for vomiting.  Neurological: Negative for dizziness and weakness.  Psychiatric/Behavioral: The patient is nervous/anxious.   All other systems reviewed and are negative.    Observations/Objective: No SOB or distress   Assessment and Plan: Tonya Franklin comes in today with chief complaint of No chief complaint on file.   Diagnosis and orders addressed:  1. Palpitations Pt will come into the office next week for EKG and lab work Avoid all drug use and tamper down alcohol Stay hydrated Stress management  Keep appt   I discussed the assessment and treatment plan with the patient. The patient was provided an opportunity to ask questions and all were answered. The patient agreed with the plan and demonstrated an understanding of the instructions.   The patient was advised to call back or seek an in-person evaluation if the symptoms worsen or if the condition fails to improve as anticipated.  The above assessment and management plan was discussed with the patient. The patient verbalized understanding of and has agreed to the management plan. Patient is aware to call the clinic if symptoms persist or worsen. Patient is aware when to return to the clinic for a follow-up visit. Patient educated on when it is appropriate to go to the emergency department.   Time call ended:  3:24 pm  I provided 14 minutes of non-face-to-face time during this encounter.    Evelina Dun, FNP

## 2018-07-30 ENCOUNTER — Other Ambulatory Visit: Payer: Self-pay

## 2018-07-31 ENCOUNTER — Ambulatory Visit (INDEPENDENT_AMBULATORY_CARE_PROVIDER_SITE_OTHER): Payer: BC Managed Care – PPO | Admitting: Family

## 2018-07-31 ENCOUNTER — Encounter: Payer: Self-pay | Admitting: Family

## 2018-07-31 VITALS — BP 128/82 | HR 70 | Temp 98.8°F | Ht 64.0 in | Wt 186.4 lb

## 2018-07-31 DIAGNOSIS — F1010 Alcohol abuse, uncomplicated: Secondary | ICD-10-CM | POA: Diagnosis not present

## 2018-07-31 DIAGNOSIS — F1411 Cocaine abuse, in remission: Secondary | ICD-10-CM | POA: Diagnosis not present

## 2018-07-31 DIAGNOSIS — I499 Cardiac arrhythmia, unspecified: Secondary | ICD-10-CM | POA: Diagnosis not present

## 2018-07-31 DIAGNOSIS — R002 Palpitations: Secondary | ICD-10-CM | POA: Diagnosis not present

## 2018-07-31 NOTE — Patient Instructions (Signed)

## 2018-07-31 NOTE — Progress Notes (Signed)
Subjective:    Patient ID: Tonya Franklin, female    DOB: [birth year], 23 y.o.   MRN: [id number]  Chief Complaint  Patient presents with  . irregular heart beats?   PT presents to the office today with palpitations that she noticed it two weeks ago. She states she has the feeling of palpitations once every other week. She states she has done cocaine in the past and states she had palpitations, but has not done cocaine in 2 years.   She has a hx of alcohol abuse, but states she only drinks now once a week.  Palpitations  This is a recurrent problem. The current episode started in the past 7 days. The problem occurs intermittently. The symptoms are aggravated by alcohol, caffeine, cocaine and nicotine. Associated symptoms include an irregular heartbeat. Pertinent negatives include no numbness, shortness of breath or syncope.      Review of Systems  Respiratory: Negative for shortness of breath.   Cardiovascular: Positive for palpitations. Negative for syncope.  Neurological: Negative for numbness.  All other systems reviewed and are negative.  Family History  Problem Relation Age of Onset  . Diabetes Father   . Hyperlipidemia Father   . Hypertension Father   . Cancer Paternal Grandmother        lung  . Cancer Other        breast  . Cancer Other        ovarian and cervical   Social History   Socioeconomic History  . Marital status: Single    Spouse name: Not on file  . Number of children: Not on file  . Years of education: Not on file  . Highest education level: Not on file  Occupational History  . Not on file  Social Needs  . Financial resource strain: Not on file  . Food insecurity    Worry: Not on file    Inability: Not on file  . Transportation needs    Medical: Not on file    Non-medical: Not on file  Tobacco Use  . Smoking status: Former Smoker    Types: Cigarettes    Quit date: 03/19/2014    Years since quitting: 4.3  . Smokeless tobacco: Never Used   Substance and Sexual Activity  . Alcohol use: No    Comment: occ  . Drug use: No    Comment: occ  . Sexual activity: Yes    Birth control/protection: Condom  Lifestyle  . Physical activity    Days per week: Not on file    Minutes per session: Not on file  . Stress: Not on file  Relationships  . Social Herbalist on phone: Not on file    Gets together: Not on file    Attends religious service: Not on file    Active member of club or organization: Not on file    Attends meetings of clubs or organizations: Not on file    Relationship status: Not on file  Other Topics Concern  . Not on file  Social History Narrative  . Not on file       Objective:   Physical Exam Vitals signs reviewed.  Constitutional:      General: She is not in acute distress.    Appearance: She is well-developed.  HENT:     Head: Normocephalic and atraumatic.     Right Ear: Tympanic membrane normal.     Left Ear: Tympanic membrane normal.  Eyes:  Pupils: Pupils are equal, round, and reactive to light.  Neck:     Musculoskeletal: Normal range of motion and neck supple.     Thyroid: No thyromegaly.  Cardiovascular:     Rate and Rhythm: Normal rate and regular rhythm.     Heart sounds: Normal heart sounds. No murmur.  Pulmonary:     Effort: Pulmonary effort is normal. No respiratory distress.     Breath sounds: Normal breath sounds. No wheezing.  Abdominal:     General: Bowel sounds are normal. There is no distension.     Palpations: Abdomen is soft.     Tenderness: There is no abdominal tenderness.  Musculoskeletal: Normal range of motion.        General: No tenderness.  Skin:    General: Skin is warm and dry.  Neurological:     Mental Status: She is alert and oriented to person, place, and time.     Cranial Nerves: No cranial nerve deficit.     Deep Tendon Reflexes: Reflexes are normal and symmetric.  Psychiatric:        Behavior: Behavior normal.        Thought Content:  Thought content normal.        Judgment: Judgment normal.       BP 128/82   Pulse 70   Temp 98.8 F (37.1 C) (Oral)   Ht _0  (1.626 m)   Wt 186 lb 6.4 oz (84.6 kg)   BMI 32.00 kg/m      Assessment & Plan:  Tonya Franklin comes in today with chief complaint of irregular heart beats?   Diagnosis and orders addressed:  1. Irregular heart rate - EKG 12-Lead - CMP14+EGFR - Anemia Profile B - TSH - Ambulatory referral to Cardiology  2. Palpitations  - CMP14+EGFR - Anemia Profile B - TSH - Ambulatory referral to Cardiology  3. Alcohol abuse - CMP14+EGFR - Anemia Profile B - TSH - Ambulatory referral to Cardiology  4. Hx of cocaine abuse [facility]) - Ambulatory referral to Cardiology   Labs pending Health Maintenance reviewed Diet and exercise encouraged  Follow up plan: As needed   Evelina Dun, FNP

## 2018-08-01 LAB — ANEMIA PROFILE B
Basophils Absolute: 0 10*3/uL (ref 0.0–0.2)
Basos: 0 %
EOS (ABSOLUTE): 0.1 10*3/uL (ref 0.0–0.4)
Eos: 1 %
Ferritin: 28 ng/mL (ref 15–150)
Folate: 7.9 ng/mL (ref >3.0)
Hematocrit: 39.4 % (ref 34.0–46.6)
Hemoglobin: 13 g/dL (ref 11.1–15.9)
Immature Grans (Abs): 0 10*3/uL (ref 0.0–0.1)
Immature Granulocytes: 1 %
Iron Saturation: 14 % — ABNORMAL LOW (ref 15–55)
Iron: 53 ug/dL (ref 27–159)
Lymphocytes Absolute: 3.1 10*3/uL (ref 0.7–3.1)
Lymphs: 38 %
MCH: 31.3 pg (ref 26.6–33.0)
MCHC: 33 g/dL (ref 31.5–35.7)
MCV: 95 fL (ref 79–97)
Monocytes Absolute: 0.6 10*3/uL (ref 0.1–0.9)
Monocytes: 7 %
Neutrophils Absolute: 4.4 10*3/uL (ref 1.4–7.0)
Neutrophils: 53 %
Platelets: 274 10*3/uL (ref 150–450)
RBC: 4.16 x10E6/uL (ref 3.77–5.28)
RDW: 12.8 % (ref 11.7–15.4)
Retic Ct Pct: 1.4 % (ref 0.6–2.6)
Total Iron Binding Capacity: 372 ug/dL (ref 250–450)
UIBC: 319 ug/dL (ref 131–425)
Vitamin B-12: 595 pg/mL (ref 232–1245)
WBC: 8.2 10*3/uL (ref 3.4–10.8)

## 2018-08-01 LAB — CMP14+EGFR
ALT: 22 IU/L (ref 0–32)
AST: 18 IU/L (ref 0–40)
Albumin/Globulin Ratio: 1.9 (ref 1.2–2.2)
Albumin: 4.3 g/dL (ref 3.9–5.0)
Alkaline Phosphatase: 52 IU/L (ref 39–117)
BUN/Creatinine Ratio: 14 (ref 9–23)
BUN: 11 mg/dL (ref 6–20)
Bilirubin Total: 0.2 mg/dL (ref 0.0–1.2)
CO2: 21 mmol/L (ref 20–29)
Calcium: 9.4 mg/dL (ref 8.7–10.2)
Chloride: 104 mmol/L (ref 96–106)
Creatinine, Ser: 0.76 mg/dL (ref 0.57–1.00)
GFR calc Af Amer: 127 mL/min/{1.73_m2} (ref >59)
GFR calc non Af Amer: 110 mL/min/{1.73_m2} (ref >59)
Globulin, Total: 2.3 g/dL (ref 1.5–4.5)
Glucose: 89 mg/dL (ref 65–99)
Potassium: 4.2 mmol/L (ref 3.5–5.2)
Sodium: 141 mmol/L (ref 134–144)
Total Protein: 6.6 g/dL (ref 6.0–8.5)

## 2018-08-01 LAB — TSH: TSH: 2.39 u[IU]/mL (ref 0.450–4.500)

## 2018-08-06 ENCOUNTER — Ambulatory Visit: Payer: Self-pay | Admitting: Family

## 2018-08-08 ENCOUNTER — Encounter: Payer: Self-pay | Admitting: Cardiology

## 2018-08-20 ENCOUNTER — Other Ambulatory Visit: Payer: Self-pay | Admitting: Family

## 2018-08-20 DIAGNOSIS — F411 Generalized anxiety disorder: Secondary | ICD-10-CM

## 2018-08-23 ENCOUNTER — Other Ambulatory Visit: Payer: Self-pay

## 2018-08-23 DIAGNOSIS — Z20822 Contact with and (suspected) exposure to covid-19: Secondary | ICD-10-CM

## 2018-08-27 ENCOUNTER — Ambulatory Visit: Payer: Self-pay | Admitting: Family

## 2018-08-29 ENCOUNTER — Telehealth: Payer: Self-pay | Admitting: Family

## 2018-08-29 NOTE — Telephone Encounter (Signed)
PT should call that office. It can take 7-10 days for results to return. We are not doing rapid testing outpt to my knowledge.

## 2018-08-29 NOTE — Telephone Encounter (Signed)
Patient aware of recommendation.  

## 2018-10-09 ENCOUNTER — Ambulatory Visit (INDEPENDENT_AMBULATORY_CARE_PROVIDER_SITE_OTHER): Payer: BC Managed Care – PPO | Admitting: Family Medicine

## 2018-10-09 DIAGNOSIS — F10230 Alcohol dependence with withdrawal, uncomplicated: Secondary | ICD-10-CM | POA: Diagnosis not present

## 2018-10-09 DIAGNOSIS — F10930 Alcohol use, unspecified with withdrawal, uncomplicated: Secondary | ICD-10-CM

## 2018-10-09 DIAGNOSIS — R197 Diarrhea, unspecified: Secondary | ICD-10-CM | POA: Diagnosis not present

## 2018-10-09 DIAGNOSIS — R21 Rash and other nonspecific skin eruption: Secondary | ICD-10-CM | POA: Diagnosis not present

## 2018-10-09 DIAGNOSIS — R112 Nausea with vomiting, unspecified: Secondary | ICD-10-CM

## 2018-10-09 MED ORDER — TRIAMCINOLONE ACETONIDE 0.1 % EX CREA: 1.0000 "application " | TOPICAL_CREAM | Freq: Two times a day (BID) | CUTANEOUS | 0 refills | Status: DC

## 2018-10-09 NOTE — Progress Notes (Signed)
Telephone visit  Subjective: CC: nausea, rash PCP: Sharion Balloon, FNP HDQ:[id number] L Tonya Franklin is a 23 y.o. female calls for telephone consult today. Patient provides verbal consent for consult held via phone.  Location of patient: home Location of provider: Working remotely from home Others present for call: none  1. Nausea, vomiting Patient reports nausea, vomiting that onset last night.  She reports nonbloody diarrhea and subjective fevers.  No known sick contacts.  No consumption of undercooked foods, untreated water.  She notes that she stopped drinking a few days ago.  She was drinking heavily (1 pint to 1/5 per day x months).  She is seeing a Social worker in [place].  She reports that she vomited x2 at work today and subsequently left.  She denies any seizure-like activity, hallucinations or tremor.  She is unable however to keep fluids down without becoming sick.  She notes a rash that started on her left side that is itching.  Uncertain exposure.  No treatment.  No other family members with similar.   ROS: Per HPI  Allergies  Allergen Reactions  . Cefuroxime Axetil Other (See Comments)    Unknown   Past Medical History:  Diagnosis Date  . Asthma   . Bipolar disorder ([place])   . Mood swings 2014    Current Outpatient Medications:  .  busPIRone (BUSPAR) 15 MG tablet, TAKE 1 TABLET (15 MG TOTAL) BY MOUTH 3 (THREE) TIMES DAILY AS NEEDED., Disp: 90 tablet, Rfl: 0 .  FLUoxetine (PROZAC) 40 MG capsule, Take 1 capsule (40 mg total) by mouth daily., Disp: 30 capsule, Rfl: 0 .  levonorgestrel (MIRENA, 52 MG,) 20 MCG/24HR IUD, by Intrauterine route., Disp: , Rfl:  .  lisinopril (ZESTRIL) 20 MG tablet, Take 1 tablet (20 mg total) by mouth daily., Disp: 90 tablet, Rfl: 3 .  loperamide (LOPERAMIDE A-D) 2 MG tablet, Take 1 tablet (2 mg total) by mouth 4 (four) times daily as needed for diarrhea or loose stools., Disp: 30 tablet, Rfl: 0  Assessment/ Plan: 23 y.o. female   1. Nausea,  vomiting and diarrhea I have ordered COVID testing but I have high suspicion that the symptoms that she is experiencing is related to alcohol withdrawal.  Unfortunately this is a telephone visit and physical findings are very limited.  I have recommended that she seek emergent care in the emergency department given inability to stay hydrated.  Would likely at minimum need basic metabolic labs and possibly IV hydration.  We discussed that inpatient admission for alcohol withdrawal may be presented to her if she is showing signs and symptoms that are concerning.  I recommended close follow-up with her PCP if she is discharged with medications for home to come off alcohol, as these will need to be continued by her PCP.  She voiced good understanding. - Novel Coronavirus, NAA ([facility])  2. Alcohol withdrawal syndrome without complication ([place])  3. Rash and nonspecific skin eruption Uncertain etiology.  Triamcinolone prescribed.  Follow-up PRN - Novel Coronavirus, NAA ([facility]) - triamcinolone cream (KENALOG) 0.1 %; Apply 1 application topically 2 (two) times daily. x7-10 days  Dispense: 30 g; Refill: 0   Start time: 2:47pm End time: 2:55pm  Total time spent on patient care (including telephone call/ virtual visit): 15 minutes  [place], [place] [phone]

## 2018-10-09 NOTE — Patient Instructions (Signed)
Alcohol Withdrawal Syndrome When a person who drinks a lot of alcohol stops drinking, he or she may have unpleasant and serious symptoms. These symptoms are called alcohol withdrawal syndrome. This condition may be mild or severe. It can be life-threatening. It can cause:  Shaking that you cannot control (tremor).  Sweating.  Headache.  Feeling fearful, upset, grouchy, or depressed.  Trouble sleeping (insomnia).  Nightmares.  Fast or uneven heartbeats (palpitations).  Alcohol cravings.  Feeling sick to your stomach (nausea).  Throwing up (vomiting).  Being bothered by light and sounds.  Confusion.  Trouble thinking clearly.  Not being hungry (loss of appetite).  Big changes in mood (mood swings). If you have all of the following symptoms at the same time, get help right away:  High blood pressure.  Fast heartbeat.  Trouble breathing.  Seizures.  Seeing, hearing, feeling, smelling, or tasting things that are not there (hallucinations). These symptoms are known as delirium tremens (DTs). They must be treated at the hospital right away. Follow these instructions at home:   Take over-the-counter and prescription medicines only as told by your doctor. This includes vitamins.  Do not drink alcohol.  Do not drive until your doctor says that this is safe for you.  Have someone stay with you or be available in case you need help. This should be someone you trust. This person can help you with your symptoms. He or she can also help you to not drink.  Drink enough fluid to keep your pee (urine) pale yellow.  Think about joining a support group or a treatment program to help you stop drinking.  Keep all follow-up visits as told by your doctor. This is important. Contact a doctor if:  Your symptoms get worse.  You cannot eat or drink without throwing up.  You have a hard time not drinking alcohol.  You cannot stop drinking alcohol. Get help right away if:   You have fast or uneven heartbeats.  You have chest pain.  You have trouble breathing.  You have a seizure for the first time.  You see, hear, feel, smell, or taste something that is not there.  You get very confused. Summary  When a person who drinks a lot of alcohol stops drinking, he or she may have serious symptoms. This is called alcohol withdrawal syndrome.  Delirium tremens (DTs) is a group of life-threatening symptoms. You should get help right away if you have these symptoms.  Think about joining an alcohol support group or a treatment program. This information is not intended to replace advice given to you by your health care provider. Make sure you discuss any questions you have with your health care provider. Document Released: 07/05/2007 Document Revised: 12/29/2016 Document Reviewed: 09/22/2016 Elsevier Patient Education  2020 Elsevier Inc.  

## 2018-10-10 ENCOUNTER — Telehealth: Payer: Self-pay

## 2018-10-10 ENCOUNTER — Telehealth: Payer: Self-pay | Admitting: Family

## 2018-10-10 NOTE — Telephone Encounter (Signed)
Talked to Mandy 

## 2018-10-10 NOTE — Telephone Encounter (Signed)
Patient called concerning visit for alcoholism that she had yesterday. She wanted to know if she could have medications to help her with withdrawels. Per note yesterday Dr Darnell Level was concerned and advised her to go to the ER if her symptoms worsen. Advised patient to go ahead and go to the ER to be evaluated and treated. Patient verbalized understanding

## 2018-10-11 ENCOUNTER — Other Ambulatory Visit: Payer: Self-pay | Admitting: Family

## 2018-10-11 DIAGNOSIS — F411 Generalized anxiety disorder: Secondary | ICD-10-CM

## 2018-10-30 ENCOUNTER — Ambulatory Visit (INDEPENDENT_AMBULATORY_CARE_PROVIDER_SITE_OTHER): Payer: BC Managed Care – PPO | Admitting: Family Medicine

## 2018-10-30 ENCOUNTER — Encounter: Payer: Self-pay | Admitting: Family Medicine

## 2018-10-30 DIAGNOSIS — J069 Acute upper respiratory infection, unspecified: Secondary | ICD-10-CM

## 2018-10-30 MED ORDER — CHLORPHEN-PE-ACETAMINOPHEN 4-10-325 MG PO TABS: 1.0000 | ORAL_TABLET | Freq: Four times a day (QID) | ORAL | 0 refills | Status: DC | PRN

## 2018-10-30 MED ORDER — FLUTICASONE PROPIONATE 50 MCG/ACT NA SUSP: 2.0000 | Freq: Every day | NASAL | 6 refills | Status: DC

## 2018-10-30 NOTE — Progress Notes (Signed)
Virtual Visit via telephone Note Due to COVID-19 pandemic this visit was conducted virtually. This visit type was conducted due to national recommendations for restrictions regarding the COVID-19 Pandemic (e.g. social distancing, sheltering in place) in an effort to limit this patient's exposure and mitigate transmission in our community. All issues noted in this document were discussed and addressed.  A physical exam was not performed with this format.   I connected with Tonya Franklin on 10/30/18 at 1200 by telephone and verified that I am speaking with the correct person using two identifiers. Tonya Franklin is currently located at home and family is currently with them during visit. The provider, Monia Pouch, FNP is located in their office at time of visit.  I discussed the limitations, risks, security and privacy concerns of performing an evaluation and management service by telephone and the availability of in person appointments. I also discussed with the patient that there may be a patient responsible charge related to this service. The patient expressed understanding and agreed to proceed.  Subjective:  Patient ID: Tonya Franklin, female    DOB: Sep 27, 1995, 23 y.o.   MRN: [id number]  Chief Complaint:  Nasal Congestion   HPI: Tonya Franklin is a 23 y.o. female presenting on 10/30/2018 for Nasal Congestion   Pt reports she woke up this morning with a scratchy throat and postnasal drainage. Pt denies fever, chills, malaise, fatigue, cough, or shortness of breath. No recent travel or known sick exposures. Pt states she has been eating and drinking fine. No decrease in urine output.     Relevant past medical, surgical, family, and social history reviewed and updated as indicated.  Allergies and medications reviewed and updated.   Past Medical History:  Diagnosis Date  . Asthma   . Bipolar disorder ([place])   . Mood swings 2014    Past Surgical History:  Procedure  Laterality Date  . adnoids    . CYST EXCISION     cyst removed from face    Social History   Socioeconomic History  . Marital status: Single    Spouse name: Not on file  . Number of children: Not on file  . Years of education: Not on file  . Highest education level: Not on file  Occupational History  . Not on file  Social Needs  . Financial resource strain: Not on file  . Food insecurity    Worry: Not on file    Inability: Not on file  . Transportation needs    Medical: Not on file    Non-medical: Not on file  Tobacco Use  . Smoking status: Former Smoker    Types: Cigarettes    Quit date: 03/19/2014    Years since quitting: 4.6  . Smokeless tobacco: Never Used  Substance and Sexual Activity  . Alcohol use: No    Comment: occ  . Drug use: No    Comment: occ  . Sexual activity: Yes    Birth control/protection: Condom  Lifestyle  . Physical activity    Days per week: Not on file    Minutes per session: Not on file  . Stress: Not on file  Relationships  . Social Herbalist on phone: Not on file    Gets together: Not on file    Attends religious service: Not on file    Active member of club or organization: Not on file    Attends meetings of clubs or organizations: Not on  file    Relationship status: Not on file  . Intimate partner violence    Fear of current or ex partner: Not on file    Emotionally abused: Not on file    Physically abused: Not on file    Forced sexual activity: Not on file  Other Topics Concern  . Not on file  Social History Narrative  . Not on file    Outpatient Encounter Medications as of 10/30/2018  Medication Sig  . busPIRone (BUSPAR) 15 MG tablet TAKE 1 TABLET (15 MG TOTAL) BY MOUTH 3 (THREE) TIMES DAILY AS NEEDED.  Marland Kitchen Chlorphen-PE-Acetaminophen 4-10-325 MG TABS Take 1 tablet by mouth every 6 (six) hours as needed.  Marland Kitchen FLUoxetine (PROZAC) 40 MG capsule Take 1 capsule (40 mg total) by mouth daily.  . fluticasone (FLONASE) 50  MCG/ACT nasal spray Place 2 sprays into both nostrils daily.  Marland Kitchen levonorgestrel (MIRENA, 52 MG,) 20 MCG/24HR IUD by Intrauterine route.  Marland Kitchen lisinopril (ZESTRIL) 20 MG tablet Take 1 tablet (20 mg total) by mouth daily.  Marland Kitchen loperamide (LOPERAMIDE A-D) 2 MG tablet Take 1 tablet (2 mg total) by mouth 4 (four) times daily as needed for diarrhea or loose stools.  . triamcinolone cream (KENALOG) 0.1 % Apply 1 application topically 2 (two) times daily. x7-10 days   No facility-administered encounter medications on file as of 10/30/2018.     Allergies  Allergen Reactions  . Cefuroxime Axetil Other (See Comments)    Unknown    Review of Systems  Constitutional: Negative for activity change, appetite change, chills, diaphoresis, fatigue, fever and unexpected weight change.  HENT: Positive for congestion, postnasal drip, rhinorrhea and sore throat. Negative for sinus pressure, sinus pain, sneezing, tinnitus, trouble swallowing and voice change.   Eyes: Negative.  Negative for photophobia and visual disturbance.  Respiratory: Negative for cough, chest tightness and shortness of breath.   Cardiovascular: Negative for chest pain, palpitations and leg swelling.  Gastrointestinal: Negative for abdominal pain, blood in stool, constipation, diarrhea, nausea and vomiting.  Endocrine: Negative.   Genitourinary: Negative for decreased urine volume, difficulty urinating, dysuria, frequency and urgency.  Musculoskeletal: Negative for arthralgias, back pain and myalgias.  Skin: Negative.  Negative for color change and pallor.  Allergic/Immunologic: Negative.   Neurological: Negative for tremors, seizures, syncope, facial asymmetry, speech difficulty, weakness, light-headedness, numbness and headaches.  Hematological: Negative.   Psychiatric/Behavioral: Negative for confusion, hallucinations, sleep disturbance and suicidal ideas.  All other systems reviewed and are negative.        Observations/Objective: No  vital signs or physical exam, this was a telephone or virtual health encounter.  Pt alert and oriented, answers all questions appropriately, and able to speak in full sentences.    Assessment and Plan: Nakeda was seen today for nasal congestion.  Diagnoses and all orders for this visit:  URI with cough and congestion Reported symptoms consistent with URI without red flags concerning for acute bacterial infection. Symptomatic care discussed in detail. Frequent saline nasal sprays. Force fluids. Medications as prescribed. Report any new or worsening symptoms.  -     Chlorphen-PE-Acetaminophen 4-10-325 MG TABS; Take 1 tablet by mouth every 6 (six) hours as needed. -     fluticasone (FLONASE) 50 MCG/ACT nasal spray; Place 2 sprays into both nostrils daily.     Follow Up Instructions: Return if symptoms worsen or fail to improve.    I discussed the assessment and treatment plan with the patient. The patient was provided an opportunity to ask questions  and all were answered. The patient agreed with the plan and demonstrated an understanding of the instructions.   The patient was advised to call back or seek an in-person evaluation if the symptoms worsen or if the condition fails to improve as anticipated.  The above assessment and management plan was discussed with the patient. The patient verbalized understanding of and has agreed to the management plan. Patient is aware to call the clinic if they develop any new symptoms or if symptoms persist or worsen. Patient is aware when to return to the clinic for a follow-up visit. Patient educated on when it is appropriate to go to the emergency department.    I provided 15 minutes of non-face-to-face time during this encounter. The call started at 1200. The call ended at 1215. The other time was used for coordination of care.    Kari BaarsMichelle Lamanda Rudder, FNP-C [facility] [address] [place], [place] [id number] [phone]  10/30/18

## 2018-10-31 ENCOUNTER — Ambulatory Visit: Payer: Self-pay | Admitting: Cardiology

## 2018-11-09 ENCOUNTER — Other Ambulatory Visit: Payer: Self-pay | Admitting: Family

## 2018-11-09 DIAGNOSIS — F411 Generalized anxiety disorder: Secondary | ICD-10-CM

## 2018-11-11 MED ORDER — BUSPIRONE HCL 15 MG PO TABS: 15.0000 mg | ORAL_TABLET | Freq: Three times a day (TID) | ORAL | 0 refills | Status: DC | PRN

## 2018-11-11 NOTE — Telephone Encounter (Signed)
[place]. NTBS 30 days given 10/11/18

## 2018-11-11 NOTE — Addendum Note (Signed)
Addended by: Zannie Cove on: 11/11/2018 02:34 PM   Modules accepted: Orders

## 2018-11-12 NOTE — Progress Notes (Deleted)
Cardiology Office Note   Date:  11/12/2018   ID:  Tonya Franklin, DOB 02-Jan-1996, MRN [id number]  PCP:  Sharion Balloon, FNP  Cardiologist:   No primary care provider on file. Referring:  ***  No chief complaint on file.     History of Present Illness: Tonya Franklin is a 23 y.o. female who presents for evaluation of palpitations.  ***     Past Medical History:  Diagnosis Date  . Asthma   . Bipolar disorder ([place])   . BV (bacterial vaginosis) 05/31/2015  . Depression with anxiety 10/10/2012  . Hypertension 05/28/2018  . IUD (intrauterine device) in place 10/23/2016  . 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 Mood swings 2014  . Obesity (BMI 30-39.9) 06/29/2017  . Obsessive-compulsive disorder 06/29/2017  . Right leg swelling 03/22/2015    Past Surgical History:  Procedure Laterality Date  . adnoids    . CYST EXCISION     cyst removed from face     Current Outpatient Medications  Medication Sig Dispense Refill  . busPIRone (BUSPAR) 15 MG tablet Take 1 tablet (15 mg total) by mouth 3 (three) times daily as needed. 90 tablet 0  . Chlorphen-PE-Acetaminophen 4-10-325 MG TABS Take 1 tablet by mouth every 6 (six) hours as needed. 20 tablet 0  . FLUoxetine (PROZAC) 40 MG capsule Take 1 capsule (40 mg total) by mouth daily. 30 capsule 0  . fluticasone (FLONASE) 50 MCG/ACT nasal spray Place 2 sprays into both nostrils daily. 16 g 6  . levonorgestrel (MIRENA, 52 MG,) 20 MCG/24HR IUD by Intrauterine route.    Marland Kitchen lisinopril (ZESTRIL) 20 MG tablet Take 1 tablet (20 mg total) by mouth daily. 90 tablet 3  . loperamide (LOPERAMIDE A-D) 2 MG tablet Take 1 tablet (2 mg total) by mouth 4 (four) times daily as needed for diarrhea or loose stools. 30 tablet 0  . triamcinolone cream (KENALOG) 0.1 % Apply 1 application topically 2 (two) times  daily. x7-10 days 30 g 0   No current facility-administered medications for this visit.     Allergies:   Cefuroxime axetil    Social History:  The patient  reports that she quit smoking about 4 years ago. Her smoking use included cigarettes. She has never used smokeless tobacco. She reports that she does not drink alcohol or use drugs.   Family History:  The patient's ***family history includes Cancer in her paternal grandmother and other family members; Diabetes in her father; Hyperlipidemia in her father; Hypertension in her father.    ROS:  Please see the history of present illness.   Otherwise, review of systems are positive for {NONE DEFAULTED:18576::"none"}.   All other systems are reviewed and negative.    PHYSICAL EXAM: VS:  There were no vitals taken for this visit. , BMI There is no height or weight on file to calculate BMI. GENERAL:  Well appearing HEENT:  Pupils equal round and reactive, fundi not visualized, oral mucosa unremarkable NECK:  No jugular venous distention, waveform within normal limits, carotid upstroke brisk and symmetric, no bruits, no thyromegaly LYMPHATICS:  No cervical, inguinal adenopathy LUNGS:  Clear to auscultation bilaterally BACK:  No CVA tenderness CHEST:  Unremarkable HEART:  PMI not displaced or sustained,S1 and S2 within normal limits, no S3, no S4, no clicks, no rubs, ***  murmurs ABD:  Flat, positive bowel sounds normal in frequency in pitch, no bruits, no rebound, no guarding, no midline pulsatile mass, no hepatomegaly, no splenomegaly EXT:  2 plus pulses throughout, no edema, no cyanosis no clubbing SKIN:  No rashes no nodules NEURO:  Cranial nerves II through XII grossly intact, motor grossly intact throughout PSYCH:  Cognitively intact, oriented to person place and time    EKG:  EKG {ACTION; IS/IS WIO:97353299} ordered today. The ekg ordered today demonstrates ***   Recent Labs: 07/31/2018: ALT 22; BUN 11; Creatinine, Ser 0.76;  Hemoglobin 13.0; Platelets 274; Potassium 4.2; Sodium 141; TSH 2.390    Lipid Panel No results found for: CHOL, TRIG, HDL, CHOLHDL, VLDL, LDLCALC, LDLDIRECT    Wt Readings from Last 3 Encounters:  07/31/18 186 lb 6.4 oz (84.6 kg)  05/30/18 180 lb (81.6 kg)  05/28/18 180 lb (81.6 kg)      Other studies Reviewed: Additional studies/ records that were reviewed today include: ***. Review of the above records demonstrates:  Please see elsewhere in the note.  ***   ASSESSMENT AND PLAN:  PALPITATIONS:  ***   Current medicines are reviewed at length with the patient today.  The patient {ACTIONS; HAS/DOES NOT HAVE:19233} concerns regarding medicines.  The following changes have been made:  {PLAN; NO CHANGE:13088:s}  Labs/ tests ordered today include: *** No orders of the defined types were placed in this encounter.    Disposition:   FU with ***    Signed, Rollene Rotunda, MD  11/12/2018 8:45 PM    [facility]

## 2018-11-13 ENCOUNTER — Ambulatory Visit: Payer: Self-pay | Admitting: Cardiology

## 2018-12-04 ENCOUNTER — Emergency Department (HOSPITAL_COMMUNITY)
Admission: EM | Admit: 2018-12-04 | Discharge: 2018-12-04 | Disposition: A | Discharge status: Home / Self Care | Payer: BC Managed Care – PPO | Attending: Emergency Medicine | Admitting: Emergency Medicine

## 2018-12-04 ENCOUNTER — Encounter (HOSPITAL_COMMUNITY): Payer: Self-pay | Admitting: Emergency Medicine

## 2018-12-04 ENCOUNTER — Other Ambulatory Visit: Payer: Self-pay

## 2018-12-04 DIAGNOSIS — J45909 Unspecified asthma, uncomplicated: Secondary | ICD-10-CM | POA: Insufficient documentation

## 2018-12-04 DIAGNOSIS — I10 Essential (primary) hypertension: Secondary | ICD-10-CM | POA: Diagnosis not present

## 2018-12-04 DIAGNOSIS — L299 Pruritus, unspecified: Secondary | ICD-10-CM | POA: Diagnosis present

## 2018-12-04 DIAGNOSIS — Z87891 Personal history of nicotine dependence: Secondary | ICD-10-CM | POA: Diagnosis not present

## 2018-12-04 DIAGNOSIS — Z79899 Other long term (current) drug therapy: Secondary | ICD-10-CM | POA: Insufficient documentation

## 2018-12-04 DIAGNOSIS — T7840XA Allergy, unspecified, initial encounter: Secondary | ICD-10-CM | POA: Diagnosis not present

## 2018-12-04 MED ORDER — FAMOTIDINE 20 MG PO TABS
20.0000 mg | ORAL_TABLET | Freq: Once | ORAL | Status: AC
  Administered 2018-12-04: 20 mg via ORAL
  Filled 2018-12-04: qty 1

## 2018-12-04 MED ORDER — PREDNISONE 20 MG PO TABS
60.0000 mg | ORAL_TABLET | Freq: Once | ORAL | Status: AC
  Administered 2018-12-04: 20:00:00 60 mg via ORAL
  Filled 2018-12-04: qty 3

## 2018-12-04 MED ORDER — EPINEPHRINE 0.3 MG/0.3ML IJ SOAJ: 0.3000 mg | INTRAMUSCULAR | 1 refills | Status: DC | PRN

## 2018-12-04 NOTE — ED Provider Notes (Signed)
[facility] EMERGENCY DEPARTMENT Provider Note   CSN: [id number] Arrival date & time: 12/04/18  1929     History   Chief Complaint Chief Complaint  Patient presents with  . Allergic Reaction    HPI Tonya Franklin is a 23 y.o. female presenting to the emergency department with complaint of allergic reaction that occurred this evening.  She states she ate a large sushi roll of imitation crab and then began having itching of her hands and feeling as though her ears were hot.  States her voice also became hoarse.  This is happened to her in the past with watermelon as well as a cucumber when she eats large amounts of cucumber.  She states she has been having small amounts of cucumber in a few pieces of sushi for some time now though thinks she may have eaten to more than usual today.  She has never had allergy to crab, had crab legs last week without issue.  She treated her symptoms with 2 Benadryl prior to arrival.  She denies any swelling of her lips or tongue, difficulty breathing or swallowing.  Her symptoms have resolved after Benadryl.  No other new medications or other known allergies other than antibiotic allergy cefuroxime.     The history is provided by the patient.    Past Medical History:  Diagnosis Date  . Asthma   . Bipolar disorder ([place])   . BV (bacterial vaginosis) 05/31/2015  . Depression with anxiety 10/10/2012  . Hypertension 05/28/2018  . IUD (intrauterine device) in place 10/23/2016  . 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 LGSIL/mild dysplasia, HPV testing not done- never went for colpo, wants to f/u here     Colpo:____   . Mood swings 2014  . Obesity (BMI 30-39.9) 06/29/2017  . Obsessive-compulsive disorder 06/29/2017  . Right leg swelling 03/22/2015    Patient Active Problem List   Diagnosis Date Noted  . Hypertension 05/28/2018  . Obsessive-compulsive disorder 06/29/2017  . Alcohol abuse 06/29/2017   . Obesity (BMI 30-39.9) 06/29/2017  . IUD (intrauterine device) in place 10/23/2016  . 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 BV (bacterial vaginosis) 05/31/2015  . Depression with anxiety 10/10/2012    Past Surgical History:  Procedure Laterality Date  . adnoids    . CYST EXCISION     cyst removed from face     OB History   No obstetric history on file.      Home Medications    Prior to Admission medications   Medication Sig Start Date End Date Taking? Authorizing Provider  busPIRone (BUSPAR) 15 MG tablet Take 1 tablet (15 mg total) by mouth 3 (three) times daily as needed. 11/11/18   Evelina Dun A, FNP  Chlorphen-PE-Acetaminophen 4-10-325 MG TABS Take 1 tablet by mouth every 6 (six) hours as needed. 10/30/18   Baruch Gouty, FNP  EPINEPHrine 0.3 mg/0.3 mL IJ SOAJ injection Inject 0.3 mLs (0.3 mg total) into the muscle as needed for anaphylaxis. 12/04/18   Azlee Monforte, [place] N, PA-C  FLUoxetine (PROZAC) 40 MG capsule Take 1 capsule (40 mg total) by mouth daily. 12/12/17   Janora Norlander, DO  fluticasone (FLONASE) 50 MCG/ACT nasal spray Place 2 sprays into both nostrils daily. 10/30/18   Baruch Gouty, FNP  levonorgestrel (MIRENA, 52 MG,) 20 MCG/24HR IUD by Intrauterine route.    [provider]  lisinopril (ZESTRIL) 20 MG tablet Take 1 tablet (20  mg total) by mouth daily. 05/28/18   [name] A, FNP  loperamide (LOPERAMIDE A-D) 2 MG tablet Take 1 tablet (2 mg total) by mouth 4 (four) times daily as needed for diarrhea or loose stools. 05/30/18   Tressia Danas, MD  triamcinolone cream (KENALOG) 0.1 % Apply 1 application topically 2 (two) times daily. x7-10 days 10/09/18   Raliegh Ip, DO    Family History Family History  Problem Relation Age of Onset  . Diabetes Father   . Hyperlipidemia Father   . Hypertension Father   . Cancer Paternal Grandmother        lung  . Cancer Other        breast  . Cancer Other         ovarian and cervical    Social History Social History   Tobacco Use  . Smoking status: Former Smoker    Types: Cigarettes    Quit date: 03/19/2014    Years since quitting: 4.7  . Smokeless tobacco: Never Used  Substance Use Topics  . Alcohol use: No    Comment: occ  . Drug use: No    Comment: occ     Allergies   Cefuroxime axetil   Review of Systems Review of Systems  All other systems reviewed and are negative.    Physical Exam Updated Vital Signs BP (!) 138/97   Pulse 77   Temp 98.5 F (36.9 C) (Oral)   Resp 20   SpO2 100%   Physical Exam Vitals signs and nursing note reviewed.  Constitutional:      General: She is not in acute distress.    Appearance: She is well-developed.  HENT:     Head: Normocephalic and atraumatic.  Eyes:     Conjunctiva/sclera: Conjunctivae normal.  Cardiovascular:     Rate and Rhythm: Normal rate and regular rhythm.  Pulmonary:     Effort: Pulmonary effort is normal. No respiratory distress.     Breath sounds: Normal breath sounds.  Abdominal:     Palpations: Abdomen is soft.  Skin:    General: Skin is warm.     Findings: No rash.  Neurological:     Mental Status: She is alert.  Psychiatric:        Behavior: Behavior normal.      ED Treatments / Results  Labs (all labs ordered are listed, but only abnormal results are displayed) Labs Reviewed - No data to display  EKG None  Radiology No results found.  Procedures Procedures (including critical care time)  Medications Ordered in ED Medications  famotidine (PEPCID) tablet 20 mg (20 mg Oral Given 12/04/18 2027)  predniSONE (DELTASONE) tablet 60 mg (60 mg Oral Given 12/04/18 2027)     Initial Impression / Assessment and Plan / ED Course  I have reviewed the triage vital signs and the nursing notes.  Pertinent labs & imaging results that were available during my care of the patient were reviewed by me and considered in my medical decision making (see chart  for details).        Patient with allergic reaction after eating sushi.  Patient suspects she is allergic to cucumber as she has had similar reaction in the past to cucumber.  No swelling of the lips or tongue or difficulty breathing.  She did have itching to her hands and ears, and hoarse voice, though that has resolved with Benadryl prior to arrival.  She was treated additionally in the ED with prednisone and Pepcid  and is asymptomatic on evaluation.  Patient states she cannot remain in the ED for any monitoring as she has her young child in the car with her mother and needs to get to work early in the morning.  Patient seems reliable for follow-up, instructed of strict return precautions including symptoms of anaphylaxis.  She is agreeable to plan and safe for discharge.  Discussed results, findings, treatment and follow up. Patient advised of return precautions. Patient verbalized understanding and agreed with plan.  Final Clinical Impressions(s) / ED Diagnoses   Final diagnoses:  Allergic reaction, initial encounter    ED Discharge Orders         Ordered    EPINEPHrine 0.3 mg/0.3 mL IJ SOAJ injection  As needed     12/04/18 2057           Sharai Overbay, [place] N, PA-C 12/04/18 2057    Arby BarrettePfeiffer, Marcy, MD 12/09/18 [phone]

## 2018-12-04 NOTE — Discharge Instructions (Signed)
Please read instructions below. Take 25mg  of benadryl every 6 hours, and 20mg  of pepcid every 12 hours for rash and itching.  Schedule an appointment with your PCP to follow up on your visit today. Schedule an appointment with the Allergy specialist to follow up on your recurrent allergic reactions. Return to the ER immediately for feeling your throat closing, swelling of your lips or tongue, difficulty breathing, or new or concerning symptoms.

## 2018-12-04 NOTE — ED Triage Notes (Signed)
Pt states she ate some sushi and wasabi between 545-615pm, began having itching to both hands and mouth and feeling like her ears were hot and her voice became hoarse between 630-7. Pt took two benadryl pta. Pt speaking in full sentences. Denies allergies to anything except cefuroxime-listed in chart.

## 2018-12-04 NOTE — ED Notes (Signed)
Patient verbalizes understanding of discharge instructions. Opportunity for questioning and answers were provided. Armband removed by staff, pt discharged from ED.  

## 2018-12-04 NOTE — ED Notes (Signed)
Pt took two benadryl at approx 7:15 pm. Reaction happened after having sushi

## 2018-12-07 ENCOUNTER — Other Ambulatory Visit: Payer: Self-pay | Admitting: Family

## 2018-12-07 DIAGNOSIS — F411 Generalized anxiety disorder: Secondary | ICD-10-CM

## 2018-12-09 NOTE — Telephone Encounter (Signed)
[place]. NTBS 30 days given 11/11/18 

## 2018-12-20 ENCOUNTER — Other Ambulatory Visit: Payer: Self-pay

## 2018-12-20 DIAGNOSIS — Z20822 Contact with and (suspected) exposure to covid-19: Secondary | ICD-10-CM

## 2018-12-22 LAB — NOVEL CORONAVIRUS, NAA: SARS-CoV-2, NAA: NOT DETECTED

## 2018-12-24 ENCOUNTER — Encounter: Payer: Self-pay | Admitting: Family Medicine

## 2018-12-24 ENCOUNTER — Ambulatory Visit (INDEPENDENT_AMBULATORY_CARE_PROVIDER_SITE_OTHER): Payer: BC Managed Care – PPO | Admitting: Family Medicine

## 2018-12-24 DIAGNOSIS — J069 Acute upper respiratory infection, unspecified: Secondary | ICD-10-CM

## 2018-12-24 MED ORDER — PREDNISONE 20 MG PO TABS: ORAL_TABLET | ORAL | 0 refills | Status: DC

## 2018-12-24 MED ORDER — ALBUTEROL SULFATE HFA 108 (90 BASE) MCG/ACT IN AERS: 2.0000 | INHALATION_SPRAY | Freq: Four times a day (QID) | RESPIRATORY_TRACT | 0 refills | Status: DC | PRN

## 2018-12-24 NOTE — Progress Notes (Signed)
Virtual Visit via telephone Note Due to COVID-19 pandemic this visit was conducted virtually. This visit type was conducted due to national recommendations for restrictions regarding the COVID-19 Pandemic (e.g. social distancing, sheltering in place) in an effort to limit this patient's exposure and mitigate transmission in our community. All issues noted in this document were discussed and addressed.  A physical exam was not performed with this format.   I connected with Tonya Franklin on 12/24/2018 at 1405 by telephone and verified that I am speaking with the correct person using two identifiers. Tonya Franklin is currently located at home and family is currently with them during visit. The provider, Kari BaarsMichelle William Schake, FNP is located in their office at time of visit.  I discussed the limitations, risks, security and privacy concerns of performing an evaluation and management service by telephone and the availability of in person appointments. I also discussed with the patient that there may be a patient responsible charge related to this service. The patient expressed understanding and agreed to proceed.  Subjective:  Patient ID: Tonya Franklin, female    DOB: Sep 06, 1995, 23 y.o.   MRN: [id number]  Chief Complaint:  Cough   HPI: Tonya Franklin is a 23 y.o. female presenting on 12/24/2018 for Cough   Pt reports cough with slight congestion. States this started about 5 days ago and is not getting better. She was tested for COVID-19 on 12/20/2018 and results were negative.   Cough This is a new problem. The current episode started in the past 7 days. The problem has been waxing and waning. The problem occurs every few minutes. The cough is non-productive. Associated symptoms include nasal congestion. Pertinent negatives include no chest pain, chills, ear congestion, ear pain, fever, headaches, heartburn, hemoptysis, myalgias, postnasal drip, rash, rhinorrhea, sore throat, shortness of  breath, sweats, weight loss or wheezing. Nothing aggravates the symptoms. She has tried nothing for the symptoms.     Relevant past medical, surgical, family, and social history reviewed and updated as indicated.  Allergies and medications reviewed and updated.   Past Medical History:  Diagnosis Date  . Asthma   . Bipolar disorder ([facility])   . BV (bacterial vaginosis) 05/31/2015  . Depression with anxiety 10/10/2012  . Hypertension 05/28/2018  . IUD (intrauterine device) in place 10/23/2016  . 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 Mood swings 2014  . Obesity (BMI 30-39.9) 06/29/2017  . Obsessive-compulsive disorder 06/29/2017  . Right leg swelling 03/22/2015    Past Surgical History:  Procedure Laterality Date  . adnoids    . CYST EXCISION     cyst removed from face    Social History   Socioeconomic History  . Marital status: Single    Spouse name: Not on file  . Number of children: Not on file  . Years of education: Not on file  . Highest education level: Not on file  Occupational History  . Not on file  Social Needs  . Financial resource strain: Not on file  . Food insecurity    Worry: Not on file    Inability: Not on file  . Transportation needs    Medical: Not on file    Non-medical: Not on file  Tobacco Use  . Smoking status: Former Smoker    Types: Cigarettes    Quit  date: 03/19/2014    Years since quitting: 4.7  . Smokeless tobacco: Never Used  Substance and Sexual Activity  . Alcohol use: No    Comment: occ  . Drug use: No    Comment: occ  . Sexual activity: Yes    Birth control/protection: Condom  Lifestyle  . Physical activity    Days per week: Not on file    Minutes per session: Not on file  . Stress: Not on file  Relationships  . Social Herbalist on phone: Not on file    Gets together: Not on  file    Attends religious service: Not on file    Active member of club or organization: Not on file    Attends meetings of clubs or organizations: Not on file    Relationship status: Not on file  . Intimate partner violence    Fear of current or ex partner: Not on file    Emotionally abused: Not on file    Physically abused: Not on file    Forced sexual activity: Not on file  Other Topics Concern  . Not on file  Social History Narrative  . Not on file    Outpatient Encounter Medications as of 12/24/2018  Medication Sig  . albuterol (VENTOLIN HFA) 108 (90 Base) MCG/ACT inhaler Inhale 2 puffs into the lungs every 6 (six) hours as needed for wheezing or shortness of breath.  . busPIRone (BUSPAR) 15 MG tablet Take 1 tablet (15 mg total) by mouth 3 (three) times daily as needed.  . Chlorphen-PE-Acetaminophen 4-10-325 MG TABS Take 1 tablet by mouth every 6 (six) hours as needed.  Marland Kitchen EPINEPHrine 0.3 mg/0.3 mL IJ SOAJ injection Inject 0.3 mLs (0.3 mg total) into the muscle as needed for anaphylaxis.  Marland Kitchen FLUoxetine (PROZAC) 40 MG capsule Take 1 capsule (40 mg total) by mouth daily.  . fluticasone (FLONASE) 50 MCG/ACT nasal spray Place 2 sprays into both nostrils daily.  Marland Kitchen levonorgestrel (MIRENA, 52 MG,) 20 MCG/24HR IUD by Intrauterine route.  Marland Kitchen lisinopril (ZESTRIL) 20 MG tablet Take 1 tablet (20 mg total) by mouth daily.  Marland Kitchen loperamide (LOPERAMIDE A-D) 2 MG tablet Take 1 tablet (2 mg total) by mouth 4 (four) times daily as needed for diarrhea or loose stools.  . predniSONE (DELTASONE) 20 MG tablet 2 po at sametime daily for 5 days  . triamcinolone cream (KENALOG) 0.1 % Apply 1 application topically 2 (two) times daily. x7-10 days   No facility-administered encounter medications on file as of 12/24/2018.     Allergies  Allergen Reactions  . Cefuroxime Axetil Other (See Comments)    Unknown    Review of Systems  Constitutional: Negative for activity change, appetite change, chills,  diaphoresis, fatigue, fever, unexpected weight change and weight loss.  HENT: Positive for congestion. Negative for ear pain, postnasal drip, rhinorrhea and sore throat.   Eyes: Negative.  Negative for photophobia and visual disturbance.  Respiratory: Positive for cough. Negative for hemoptysis, chest tightness, shortness of breath and wheezing.   Cardiovascular: Negative for chest pain, palpitations and leg swelling.  Gastrointestinal: Negative for abdominal pain, blood in stool, constipation, diarrhea, heartburn, nausea and vomiting.  Endocrine: Negative.   Genitourinary: Negative for decreased urine volume, difficulty urinating, dysuria, frequency and urgency.  Musculoskeletal: Negative for arthralgias and myalgias.  Skin: Negative.  Negative for rash.  Allergic/Immunologic: Negative.   Neurological: Negative for dizziness, weakness and headaches.  Hematological: Negative.   Psychiatric/Behavioral: Negative for confusion, hallucinations, sleep  disturbance and suicidal ideas.  All other systems reviewed and are negative.        Observations/Objective: No vital signs or physical exam, this was a telephone or virtual health encounter.  Pt alert and oriented, answers all questions appropriately, and able to speak in full sentences.    Assessment and Plan: Tonya Franklin was seen today for cough.  Diagnoses and all orders for this visit:  URI with cough and congestion Viral URI, negative COVID-19 test, symptomatic care discussed in detail. Increase water intake. Mucinex regular or extra strength. Rest. Medications as prescribed. Report any new or worsening symptoms.  -     predniSONE (DELTASONE) 20 MG tablet; 2 po at sametime daily for 5 days -     albuterol (VENTOLIN HFA) 108 (90 Base) MCG/ACT inhaler; Inhale 2 puffs into the lungs every 6 (six) hours as needed for wheezing or shortness of breath.     Follow Up Instructions: Return if symptoms worsen or fail to improve.    I  discussed the assessment and treatment plan with the patient. The patient was provided an opportunity to ask questions and all were answered. The patient agreed with the plan and demonstrated an understanding of the instructions.   The patient was advised to call back or seek an in-person evaluation if the symptoms worsen or if the condition fails to improve as anticipated.  The above assessment and management plan was discussed with the patient. The patient verbalized understanding of and has agreed to the management plan. Patient is aware to call the clinic if they develop any new symptoms or if symptoms persist or worsen. Patient is aware when to return to the clinic for a follow-up visit. Patient educated on when it is appropriate to go to the emergency department.    I provided 15 minutes of non-face-to-face time during this encounter. The call started at 1405. The call ended at 1420. The other time was used for coordination of care.    Kari Baars, FNP-C [facility] [address] [place], [place] [address] [phone] 12/24/2018

## 2019-03-05 ENCOUNTER — Ambulatory Visit (INDEPENDENT_AMBULATORY_CARE_PROVIDER_SITE_OTHER): Payer: BC Managed Care – PPO | Admitting: Physician Assistant

## 2019-03-05 ENCOUNTER — Encounter: Payer: Self-pay | Admitting: Physician Assistant

## 2019-03-05 DIAGNOSIS — J020 Streptococcal pharyngitis: Secondary | ICD-10-CM | POA: Diagnosis not present

## 2019-03-05 MED ORDER — AMOXICILLIN 250 MG/5ML PO SUSR: 500.0000 mg | Freq: Three times a day (TID) | ORAL | 0 refills | Status: DC

## 2019-03-05 NOTE — Progress Notes (Signed)
Telephone visit  Subjective: DT:OIZTI pharyngitis PCP: Junie Spencer, FNP [id number] L Tonya Franklin is a 24 y.o. female calls for telephone consult today. Patient provides verbal consent for consult held via phone.  Patient is identified with 2 separate identifiers.  At this time the entire area is on COVID-19 social distancing and stay home orders are in place.  Patient is of higher risk and therefore we are performing this by a virtual method.  Location of patient: home Location of provider: HOME Others present for call: no  This patient has sore throat and had less than 2 days severe fever, chills, myalgias.  Complains of sinus headache and postnasal drainage. There is copious drainage at times. Pain with swallowing, decreased appetite and headache.  Exposure to strep.    ROS: Per HPI  Allergies  Allergen Reactions  . Cefuroxime Axetil Other (See Comments)    Unknown   Past Medical History:  Diagnosis Date  . Asthma   . Bipolar disorder (HCC)   . BV (bacterial vaginosis) 05/31/2015  . Depression with anxiety 10/10/2012  . Hypertension 05/28/2018  . IUD (intrauterine device) in place 10/23/2016  . 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 Mood swings 2014  . Obesity (BMI 30-39.9) 06/29/2017  . Obsessive-compulsive disorder 06/29/2017  . Right leg swelling 03/22/2015    Current Outpatient Medications:  .  albuterol (VENTOLIN HFA) 108 (90 Base) MCG/ACT inhaler, Inhale 2 puffs into the lungs every 6 (six) hours as needed for wheezing or shortness of breath., Disp: 18 g, Rfl: 0 .  amoxicillin (AMOXIL) 250 MG/5ML suspension, Take 10 mLs (500 mg total) by mouth 3 (three) times daily., Disp: 300 mL, Rfl: 0 .  busPIRone (BUSPAR) 15 MG tablet, Take 1 tablet (15 mg total) by mouth 3 (three) times daily as needed., Disp: 90 tablet, Rfl: 0 .   Chlorphen-PE-Acetaminophen 4-10-325 MG TABS, Take 1 tablet by mouth every 6 (six) hours as needed., Disp: 20 tablet, Rfl: 0 .  EPINEPHrine 0.3 mg/0.3 mL IJ SOAJ injection, Inject 0.3 mLs (0.3 mg total) into the muscle as needed for anaphylaxis., Disp: 1 each, Rfl: 1 .  FLUoxetine (PROZAC) 40 MG capsule, Take 1 capsule (40 mg total) by mouth daily., Disp: 30 capsule, Rfl: 0 .  fluticasone (FLONASE) 50 MCG/ACT nasal spray, Place 2 sprays into both nostrils daily., Disp: 16 g, Rfl: 6 .  levonorgestrel (MIRENA, 52 MG,) 20 MCG/24HR IUD, by Intrauterine route., Disp: , Rfl:  .  lisinopril (ZESTRIL) 20 MG tablet, Take 1 tablet (20 mg total) by mouth daily., Disp: 90 tablet, Rfl: 3 .  triamcinolone cream (KENALOG) 0.1 %, Apply 1 application topically 2 (two) times daily. x7-10 days, Disp: 30 g, Rfl: 0  Assessment/ Plan: 24 y.o. female   1. Strep pharyngitis - amoxicillin (AMOXIL) 250 MG/5ML suspension; Take 10 mLs (500 mg total) by mouth 3 (three) times daily.  Dispense: 300 mL; Refill: 0   No follow-ups on file.  Continue all other maintenance medications as listed above.  Start time: 11:49 AM End time: 11:57 AM  Meds ordered this encounter  Medications  . amoxicillin (AMOXIL) 250 MG/5ML suspension    Sig: Take 10 mLs (500 mg total) by mouth 3 (three) times daily.    Dispense:  300 mL    Refill:  0    Order  Specific Question:   Supervising Provider    Answer:   Janora Norlander [[id number]]    Particia Nearing PA-C [place] [phone]

## 2019-03-07 ENCOUNTER — Telehealth: Payer: Self-pay | Admitting: Physician Assistant

## 2019-03-07 NOTE — Telephone Encounter (Signed)
What symptoms do you have? Throwing up and cramping, diarrhea, fever 101. She has a IUD and needs to be changed in about 8-9 months and don't know if that is it. Still has sore throat.  How long have you been sick? 2-1  Have you been seen for this problem? Telephone visit with Lawanna Kobus  If your provider decides to give you a prescription, which pharmacy would you like for it to be sent to? [facility] in [place]   Patient informed that this information will be sent to the clinical staff for review and that they should receive a follow up call.

## 2019-03-07 NOTE — Telephone Encounter (Signed)
She may need to go to the urgent care or ED for testing and evaluation.  Also should make the appointment for gynecology.

## 2019-03-07 NOTE — Telephone Encounter (Signed)
Tanea aware.

## 2019-03-07 NOTE — Telephone Encounter (Signed)
Seen February 3.  Please advise.

## 2019-04-01 ENCOUNTER — Encounter: Payer: Self-pay | Admitting: Nurse Practitioner

## 2019-04-01 ENCOUNTER — Telehealth (INDEPENDENT_AMBULATORY_CARE_PROVIDER_SITE_OTHER): Payer: BC Managed Care – PPO | Admitting: Nurse Practitioner

## 2019-04-01 DIAGNOSIS — J020 Streptococcal pharyngitis: Secondary | ICD-10-CM | POA: Diagnosis not present

## 2019-04-01 DIAGNOSIS — J069 Acute upper respiratory infection, unspecified: Secondary | ICD-10-CM | POA: Diagnosis not present

## 2019-04-01 MED ORDER — AMOXICILLIN 400 MG/5ML PO SUSR: ORAL | 0 refills | Status: DC

## 2019-04-01 NOTE — Progress Notes (Signed)
Virtual Visit via video Note   Due to COVID-19 pandemic this visit was conducted virtually. This visit type was conducted due to national recommendations for restrictions regarding the COVID-19 Pandemic (e.g. social distancing, sheltering in place) in an effort to limit this patient's exposure and mitigate transmission in our community. All issues noted in this document were discussed and addressed.  A physical exam was not performed with this format.  I connected with Tonya Franklin on 04/01/19 at **8:35* by video and verified that I am speaking with the correct person using two identifiers. Tonya Franklin is currently located at home and *no one** is currently with her during visit. The provider, Mary-Margaret Daphine Deutscher, FNP is located in their office at time of visit.  I discussed the limitations, risks, security and privacy concerns of performing an evaluation and management service by telephone and the availability of in person appointments. I also discussed with the patient that there may be a patient responsible charge related to this service. The patient expressed understanding and agreed to proceed.   History and Present Illness:   Chief Complaint: URI   HPI Patient calls in this morning c/o cough and congestion with low grade fever for 5 days. Chest hurts left post when coughing. Had covid test yesterday which was negative.   Review of Systems  Constitutional: Negative for diaphoresis and weight loss.  HENT: Positive for congestion.   Eyes: Negative for blurred vision, double vision and pain.  Respiratory: Positive for cough and wheezing. Negative for shortness of breath.   Cardiovascular: Negative for chest pain, palpitations, orthopnea and leg swelling.  Gastrointestinal: Negative for abdominal pain.  Skin: Negative for rash.  Neurological: Positive for headaches. Negative for dizziness, sensory change, loss of consciousness and weakness.  Endo/Heme/Allergies: Negative for  polydipsia. Does not bruise/bleed easily.  Psychiatric/Behavioral: Negative for memory loss. The patient does not have insomnia.   All other systems reviewed and are negative.      Observations/Objective: Alert and oriented- answers all questions appropriately No distress Face flushed Deep wet cough  Assessment and Plan: Tonya Franklin in today with chief complaint of URI   1. URI with cough and congestion 1. Take meds as prescribed 2. Use a cool mist humidifier especially during the winter months and when heat has been humid. 3. Use saline nose sprays frequently 4. Saline irrigations of the nose can be very helpful if done frequently.  * 4X daily for 1 week*  * Use of a nettie pot can be helpful with this. Follow directions with this* 5. Drink plenty of fluids 6. Keep thermostat turn down low 7.For any cough or congestion  Use plain Mucinex- regular strength or max strength is fine   * Children- consult with Pharmacist for dosing 8. For fever or aces or pains- take tylenol or ibuprofen appropriate for age and weight.  * for fevers greater than 101 orally you may alternate ibuprofen and tylenol every  3 hours.   Meds ordered this encounter  Medications  . amoxicillin (AMOXIL) 400 MG/5ML suspension    Sig: 2 tsp po BID for 10 days    Dispense:  200 mL    Refill:  0    Order Specific Question:   Supervising Provider    Answer:   Arville Care A [[id number]]        Follow Up Instructions: prn    I discussed the assessment and treatment plan with the patient. The patient was provided an opportunity to ask questions  and all were answered. The patient agreed with the plan and demonstrated an understanding of the instructions.   The patient was advised to call back or seek an in-person evaluation if the symptoms worsen or if the condition fails to improve as anticipated.  The above assessment and management plan was discussed with the patient. The patient  verbalized understanding of and has agreed to the management plan. Patient is aware to call the clinic if symptoms persist or worsen. Patient is aware when to return to the clinic for a follow-up visit. Patient educated on when it is appropriate to go to the emergency department.   Time call ended:8:45  I provided 10 minutes of face-to-face time during this encounter.    Mary-Margaret Hassell Done, FNP

## 2019-04-23 ENCOUNTER — Telehealth: Payer: Self-pay | Admitting: Family

## 2019-04-23 NOTE — Telephone Encounter (Signed)
Aware.  She needs to get her lisinopril filled.  She stopped taking this medicine some time back.  Advised to relax for ten minutes then take blood pressure readings .  Continue monitoring after resuming medication.  Report to [place] with readings.

## 2019-05-13 ENCOUNTER — Ambulatory Visit (INDEPENDENT_AMBULATORY_CARE_PROVIDER_SITE_OTHER): Payer: BC Managed Care – PPO | Admitting: Family Medicine

## 2019-05-13 ENCOUNTER — Encounter: Payer: Self-pay | Admitting: Family Medicine

## 2019-05-13 DIAGNOSIS — R3915 Urgency of urination: Secondary | ICD-10-CM | POA: Diagnosis not present

## 2019-05-13 DIAGNOSIS — R300 Dysuria: Secondary | ICD-10-CM | POA: Diagnosis not present

## 2019-05-13 DIAGNOSIS — F418 Other specified anxiety disorders: Secondary | ICD-10-CM | POA: Diagnosis not present

## 2019-05-13 DIAGNOSIS — R3989 Other symptoms and signs involving the genitourinary system: Secondary | ICD-10-CM | POA: Diagnosis not present

## 2019-05-13 DIAGNOSIS — F410 Panic disorder [episodic paroxysmal anxiety] without agoraphobia: Secondary | ICD-10-CM

## 2019-05-13 MED ORDER — HYDROXYZINE PAMOATE 25 MG PO CAPS: 25.0000 mg | ORAL_CAPSULE | Freq: Three times a day (TID) | ORAL | 0 refills | Status: DC | PRN

## 2019-05-13 MED ORDER — FLUOXETINE HCL 60 MG PO TABS: 1.0000 | ORAL_TABLET | Freq: Every day | ORAL | 5 refills | Status: DC

## 2019-05-13 MED ORDER — NITROFURANTOIN MONOHYD MACRO 100 MG PO CAPS: 100.0000 mg | ORAL_CAPSULE | Freq: Two times a day (BID) | ORAL | 0 refills | Status: AC

## 2019-05-13 NOTE — Progress Notes (Signed)
Virtual Visit via telephone Note Due to COVID-19 pandemic this visit was conducted virtually. This visit type was conducted due to national recommendations for restrictions regarding the COVID-19 Pandemic (e.g. social distancing, sheltering in place) in an effort to limit this patient's exposure and mitigate transmission in our community. All issues noted in this document were discussed and addressed.  A physical exam was not performed with this format.   I connected with Tonya Franklin on 05/13/2019 at 1220 by telephone and verified that I am speaking with the correct person using two identifiers. Winn Jock Knipple is currently located at home and family is currently with them during visit. The provider, Monia Pouch, FNP is located in their office at time of visit.  I discussed the limitations, risks, security and privacy concerns of performing an evaluation and management service by telephone and the availability of in person appointments. I also discussed with the patient that there may be a patient responsible charge related to this service. The patient expressed understanding and agreed to proceed.  Subjective:  Patient ID: Tonya Franklin, female    DOB: Apr 23, 1995, 24 y.o.   MRN: [id number]  Chief Complaint:  Urinary Tract Infection   HPI: Tonya Franklin is a 24 y.o. female presenting on 05/13/2019 for Urinary Tract Infection   Urinary Tract Infection  This is a new problem. The current episode started yesterday. The problem occurs every urination. The problem has been gradually worsening. The quality of the pain is described as aching and burning. The pain is at a severity of 3/10. The pain is mild. There has been no fever. She is sexually active. There is no history of pyelonephritis. Associated symptoms include frequency and urgency. Pertinent negatives include no chills, discharge, flank pain, hematuria, hesitancy, nausea, possible pregnancy, sweats or vomiting. She has tried  increased fluids for the symptoms. The treatment provided no relief.  Anxiety Presents for follow-up visit. Symptoms include decreased concentration, hyperventilation, nervous/anxious behavior, palpitations, panic and shortness of breath. Patient reports no chest pain, confusion, dizziness, nausea or suicidal ideas. Symptoms occur occasionally. The severity of symptoms is moderate. The quality of sleep is good.   Compliance with medications is 76-100%.  Reports she has been taking her Fluoxetine and Buspar as prescribed but has had an increase in her anxiety and is not starting to have panic attacks. Unknown triggers.   GAD 7 : Generalized Anxiety Score 05/13/2019  Nervous, Anxious, on Edge 3  Control/stop worrying 3  Worry too much - different things 3  Trouble relaxing 1  Restless 1  Easily annoyed or irritable 3  Afraid - awful might happen 2  Total GAD 7 Score 16    Depression screen [facility] 2/9 05/13/2019 07/31/2018 08/21/2017 06/29/2017 04/16/2015  Decreased Interest 1 0 0 0 0  Down, Depressed, Hopeless 1 0 0 0 0  PHQ - 2 Score 2 0 0 0 0  Altered sleeping 1 - - - 0  Tired, decreased energy 1 - - - 0  Change in appetite - - - - 0  Feeling bad or failure about yourself  0 - - - 0  Trouble concentrating 1 - - - 3  Moving slowly or fidgety/restless 0 - - - 0  Suicidal thoughts 0 - - - -  PHQ-9 Score 5 - - - 3  Difficult doing work/chores - - - - -     Relevant past medical, surgical, family, and social history reviewed and updated as indicated.  Allergies  and medications reviewed and updated.   Past Medical History:  Diagnosis Date  . Asthma   . Bipolar disorder (HCC)   . BV (bacterial vaginosis) 05/31/2015  . Depression with anxiety 10/10/2012  . Hypertension 05/28/2018  . IUD (intrauterine device) in place 10/23/2016  . 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 Mood swings 2014  . Obesity (BMI 30-39.9) 06/29/2017  . Obsessive-compulsive disorder 06/29/2017  . Right leg swelling 03/22/2015    Past Surgical History:  Procedure Laterality Date  . adnoids    . CYST EXCISION     cyst removed from face    Social History   Socioeconomic History  . Marital status: Single    Spouse name: Not on file  . Number of children: Not on file  . Years of education: Not on file  . Highest education level: Not on file  Occupational History  . Not on file  Tobacco Use  . Smoking status: Former Smoker    Types: Cigarettes    Quit date: 03/19/2014    Years since quitting: 5.1  . Smokeless tobacco: Never Used  Substance and Sexual Activity  . Alcohol use: No    Comment: occ  . Drug use: No    Comment: occ  . Sexual activity: Yes    Birth control/protection: Condom  Other Topics Concern  . Not on file  Social History Narrative  . Not on file   Social Determinants of Health   Financial Resource Strain:   . Difficulty of Paying Living Expenses:   Food Insecurity:   . Worried About Programme researcher, broadcasting/film/video in the Last Year:   . Barista in the Last Year:   Transportation Needs:   . Freight forwarder (Medical):   Marland Kitchen Lack of Transportation (Non-Medical):   Physical Activity:   . Days of Exercise per Week:   . Minutes of Exercise per Session:   Stress:   . Feeling of Stress :   Social Connections:   . Frequency of Communication with Friends and Family:   . Frequency of Social Gatherings with Friends and Family:   . Attends Religious Services:   . Active Member of Clubs or Organizations:   . Attends Banker Meetings:   Marland Kitchen Marital Status:   Intimate Partner Violence:   . Fear of Current or Ex-Partner:   . Emotionally Abused:   Marland Kitchen Physically Abused:   . Sexually Abused:     Outpatient Encounter Medications as of 05/13/2019  Medication Sig  . albuterol (VENTOLIN HFA) 108 (90 Base) MCG/ACT inhaler Inhale 2  puffs into the lungs every 6 (six) hours as needed for wheezing or shortness of breath.  Marland Kitchen amoxicillin (AMOXIL) 250 MG/5ML suspension Take 10 mLs (500 mg total) by mouth 3 (three) times daily.  Marland Kitchen amoxicillin (AMOXIL) 400 MG/5ML suspension 2 tsp po BID for 10 days  . busPIRone (BUSPAR) 15 MG tablet Take 1 tablet (15 mg total) by mouth 3 (three) times daily as needed.  . Chlorphen-PE-Acetaminophen 4-10-325 MG TABS Take 1 tablet by mouth every 6 (six) hours as needed.  Marland Kitchen EPINEPHrine 0.3 mg/0.3 mL IJ SOAJ injection Inject 0.3 mLs (0.3 mg total) into the muscle as needed for anaphylaxis.  Marland Kitchen FLUoxetine HCl 60 MG TABS Take 1 tablet by mouth daily.  . fluticasone (FLONASE)  50 MCG/ACT nasal spray Place 2 sprays into both nostrils daily.  . hydrOXYzine (VISTARIL) 25 MG capsule Take 1 capsule (25 mg total) by mouth 3 (three) times daily as needed.  Marland Kitchen levonorgestrel (MIRENA, 52 MG,) 20 MCG/24HR IUD by Intrauterine route.  Marland Kitchen lisinopril (ZESTRIL) 20 MG tablet Take 1 tablet (20 mg total) by mouth daily.  . nitrofurantoin, macrocrystal-monohydrate, (MACROBID) 100 MG capsule Take 1 capsule (100 mg total) by mouth 2 (two) times daily for 5 days. 1 po BId  . triamcinolone cream (KENALOG) 0.1 % Apply 1 application topically 2 (two) times daily. x7-10 days  . [DISCONTINUED] FLUoxetine (PROZAC) 40 MG capsule Take 1 capsule (40 mg total) by mouth daily.   No facility-administered encounter medications on file as of 05/13/2019.    Allergies  Allergen Reactions  . Cefuroxime Axetil Other (See Comments)    Unknown    Review of Systems  Constitutional: Negative for activity change, appetite change, chills, diaphoresis, fatigue, fever and unexpected weight change.  HENT: Negative.   Eyes: Negative.   Respiratory: Positive for shortness of breath. Negative for cough and chest tightness.   Cardiovascular: Positive for palpitations. Negative for chest pain and leg swelling.  Gastrointestinal: Negative for abdominal  pain, blood in stool, constipation, diarrhea, nausea and vomiting.  Endocrine: Negative.   Genitourinary: Positive for dysuria, frequency and urgency. Negative for decreased urine volume, difficulty urinating, dyspareunia, enuresis, flank pain, genital sores, hematuria, hesitancy, menstrual problem and pelvic pain.  Musculoskeletal: Negative for arthralgias and myalgias.  Skin: Negative.   Allergic/Immunologic: Negative.   Neurological: Negative for dizziness, tremors, seizures, syncope, facial asymmetry, speech difficulty, weakness, light-headedness, numbness and headaches.  Hematological: Negative.   Psychiatric/Behavioral: Positive for agitation, decreased concentration, dysphoric mood and sleep disturbance. Negative for behavioral problems, confusion, hallucinations, self-injury and suicidal ideas. The patient is nervous/anxious. The patient is not hyperactive.   All other systems reviewed and are negative.        Observations/Objective: No vital signs or physical exam, this was a telephone or virtual health encounter.  Pt alert and oriented, answers all questions appropriately, and able to speak in full sentences.    Assessment and Plan: Tonya Franklin was seen today for urinary tract infection.  Diagnoses and all orders for this visit:  Dysuria Urgency of micturition Suspected UTI Symptomatic care discussed in detail. Will treat with below. Report any new, worsening, or persistent symptoms.  -     nitrofurantoin, macrocrystal-monohydrate, (MACROBID) 100 MG capsule; Take 1 capsule (100 mg total) by mouth 2 (two) times daily for 5 days. 1 po BId  Depression with anxiety Panic attack Pt with increased anxiety and episodes of panic. Will increase fluoxetine to 60 mg daily and trial Vistaril 25 mg as needed for panic attack. Pt to follow up with PCP in 4 weeks for reevaluation or sooner if needed.  -     FLUoxetine HCl 60 MG TABS; Take 1 tablet by mouth daily. -     hydrOXYzine  (VISTARIL) 25 MG capsule; Take 1 capsule (25 mg total) by mouth 3 (three) times daily as needed.   Follow Up Instructions: Return in about 4 weeks (around 06/10/2019), or if symptoms worsen or fail to improve, for Anxiety.    I discussed the assessment and treatment plan with the patient. The patient was provided an opportunity to ask questions and all were answered. The patient agreed with the plan and demonstrated an understanding of the instructions.   The patient was advised to call back or  seek an in-person evaluation if the symptoms worsen or if the condition fails to improve as anticipated.  The above assessment and management plan was discussed with the patient. The patient verbalized understanding of and has agreed to the management plan. Patient is aware to call the clinic if they develop any new symptoms or if symptoms persist or worsen. Patient is aware when to return to the clinic for a follow-up visit. Patient educated on when it is appropriate to go to the emergency department.    I provided 15 minutes of non-face-to-face time during this encounter. The call started at 1220. The call ended at 1245. The other time was used for coordination of care.    Kari Baars, FNP-C [facility] [address] [place], [place] [address] [phone] 05/13/2019

## 2019-06-12 ENCOUNTER — Other Ambulatory Visit: Payer: Self-pay

## 2019-06-12 ENCOUNTER — Ambulatory Visit: Payer: HRSA Program | Attending: Internal Medicine

## 2019-06-12 DIAGNOSIS — Z20822 Contact with and (suspected) exposure to covid-19: Secondary | ICD-10-CM | POA: Insufficient documentation

## 2019-06-13 LAB — SARS-COV-2, NAA 2 DAY TAT

## 2019-06-13 LAB — NOVEL CORONAVIRUS, NAA: SARS-CoV-2, NAA: NOT DETECTED

## 2019-06-23 ENCOUNTER — Ambulatory Visit: Payer: HRSA Program | Attending: Internal Medicine

## 2019-06-23 ENCOUNTER — Other Ambulatory Visit: Payer: Self-pay | Admitting: Family

## 2019-06-23 DIAGNOSIS — Z20822 Contact with and (suspected) exposure to covid-19: Secondary | ICD-10-CM | POA: Insufficient documentation

## 2019-06-23 DIAGNOSIS — I10 Essential (primary) hypertension: Secondary | ICD-10-CM

## 2019-06-24 LAB — NOVEL CORONAVIRUS, NAA: SARS-CoV-2, NAA: NOT DETECTED

## 2019-06-24 LAB — SARS-COV-2, NAA 2 DAY TAT

## 2019-07-08 ENCOUNTER — Other Ambulatory Visit: Payer: Self-pay | Admitting: Family

## 2019-07-08 DIAGNOSIS — I10 Essential (primary) hypertension: Secondary | ICD-10-CM

## 2019-07-16 ENCOUNTER — Other Ambulatory Visit: Payer: Self-pay | Admitting: Family

## 2019-07-16 DIAGNOSIS — F418 Other specified anxiety disorders: Secondary | ICD-10-CM

## 2019-07-16 DIAGNOSIS — F410 Panic disorder [episodic paroxysmal anxiety] without agoraphobia: Secondary | ICD-10-CM

## 2019-07-21 ENCOUNTER — Other Ambulatory Visit: Payer: Self-pay | Admitting: Family

## 2019-07-21 DIAGNOSIS — F410 Panic disorder [episodic paroxysmal anxiety] without agoraphobia: Secondary | ICD-10-CM

## 2019-07-21 DIAGNOSIS — F418 Other specified anxiety disorders: Secondary | ICD-10-CM

## 2019-07-21 MED ORDER — HYDROXYZINE PAMOATE 25 MG PO CAPS: 25.0000 mg | ORAL_CAPSULE | Freq: Three times a day (TID) | ORAL | 2 refills | Status: DC | PRN

## 2019-07-31 ENCOUNTER — Other Ambulatory Visit: Payer: Self-pay | Admitting: Family

## 2019-07-31 DIAGNOSIS — I10 Essential (primary) hypertension: Secondary | ICD-10-CM

## 2019-07-31 MED ORDER — LISINOPRIL 20 MG PO TABS: 20.0000 mg | ORAL_TABLET | Freq: Every day | ORAL | 0 refills | Status: DC

## 2019-07-31 NOTE — Telephone Encounter (Signed)
30 day supply given 07/08/19 ntbs for further refills

## 2019-08-07 ENCOUNTER — Telehealth: Payer: Self-pay | Admitting: Family

## 2019-08-07 MED ORDER — FLUOXETINE HCL 20 MG PO CAPS: 60.0000 mg | ORAL_CAPSULE | Freq: Every day | ORAL | 1 refills | Status: DC

## 2019-08-07 NOTE — Telephone Encounter (Signed)
Prozac changed to capsules.

## 2019-08-07 NOTE — Addendum Note (Signed)
Addended by: [name] A on: 08/07/2019 01:33 PM   Modules accepted: Orders

## 2019-08-07 NOTE — Telephone Encounter (Signed)
Resend capsules?

## 2019-08-07 NOTE — Telephone Encounter (Signed)
Pts mom states the pharmacy stated if the FLUoxetine HCl 60 MG TABS  Can be changed to capsules it will be a lot cheaper for the pt. Mom is wanting to see if a new rx can be sent in to change the medication.

## 2019-08-12 ENCOUNTER — Ambulatory Visit: Payer: BC Managed Care – PPO | Admitting: Family

## 2019-08-19 ENCOUNTER — Encounter: Payer: Self-pay | Admitting: Family

## 2019-09-11 ENCOUNTER — Encounter: Payer: Self-pay | Admitting: Family

## 2019-09-11 ENCOUNTER — Ambulatory Visit (INDEPENDENT_AMBULATORY_CARE_PROVIDER_SITE_OTHER): Payer: Self-pay | Admitting: Family

## 2019-09-11 DIAGNOSIS — E669 Obesity, unspecified: Secondary | ICD-10-CM

## 2019-09-11 DIAGNOSIS — F418 Other specified anxiety disorders: Secondary | ICD-10-CM

## 2019-09-11 DIAGNOSIS — H65191 Other acute nonsuppurative otitis media, right ear: Secondary | ICD-10-CM

## 2019-09-11 DIAGNOSIS — I10 Essential (primary) hypertension: Secondary | ICD-10-CM

## 2019-09-11 DIAGNOSIS — R5383 Other fatigue: Secondary | ICD-10-CM

## 2019-09-11 DIAGNOSIS — F410 Panic disorder [episodic paroxysmal anxiety] without agoraphobia: Secondary | ICD-10-CM

## 2019-09-11 MED ORDER — AMOXICILLIN-POT CLAVULANATE 875-125 MG PO TABS: 1.0000 | ORAL_TABLET | Freq: Two times a day (BID) | ORAL | 0 refills | Status: DC

## 2019-09-11 MED ORDER — HYDROXYZINE PAMOATE 25 MG PO CAPS: 25.0000 mg | ORAL_CAPSULE | Freq: Three times a day (TID) | ORAL | 2 refills | Status: DC | PRN

## 2019-09-11 MED ORDER — LISINOPRIL 20 MG PO TABS: 20.0000 mg | ORAL_TABLET | Freq: Every day | ORAL | 2 refills | Status: DC

## 2019-09-11 MED ORDER — FLUOXETINE HCL 20 MG PO CAPS: 60.0000 mg | ORAL_CAPSULE | Freq: Every day | ORAL | 1 refills | Status: DC

## 2019-09-11 NOTE — Progress Notes (Signed)
Virtual Visit via telephone Note Due to COVID-19 pandemic this visit was conducted virtually. This visit type was conducted due to national recommendations for restrictions regarding the COVID-19 Pandemic (e.g. social distancing, sheltering in place) in an effort to limit this patient's exposure and mitigate transmission in our community. All issues noted in this document were discussed and addressed.  A physical exam was not performed with this format.  I connected with Tonya Franklin on 09/11/19 at 12:48 pm by telephone and verified that I am speaking with the correct person using two identifiers. Tonya Franklin is currently located at home and no one is currently with her during visit. The provider, [name], FNP is located in their office at time of visit.  I discussed the limitations, risks, security and privacy concerns of performing an evaluation and management service by telephone and the availability of in person appointments. I also discussed with the patient that there may be a patient responsible charge related to this service. The patient expressed understanding and agreed to proceed.   History and Present Illness:  Pt calls the office today with scalp tenderness right side and right ear pain that started 3 days ago. Denies any fever. She went to get tested for COVID today.  Otalgia  There is pain in the right ear. This is a new problem. The current episode started 1 to 4 weeks ago. The problem occurs every few minutes. The problem has been waxing and waning. There has been no fever. The pain is at a severity of 8/10. The pain is moderate. Associated symptoms include headaches. Pertinent negatives include no diarrhea, ear discharge, rhinorrhea or sore throat. She has tried nothing for the symptoms. The treatment provided mild relief.  Hypertension This is a chronic problem. The current episode started more than 1 year ago. The problem has been resolved since onset.  Associated symptoms include anxiety, headaches and malaise/fatigue. Pertinent negatives include no peripheral edema. Past treatments include ACE inhibitors. The current treatment provides moderate improvement.  Depression        This is a chronic problem.  The current episode started more than 1 year ago.   The onset quality is gradual.   Associated symptoms include irritable, restlessness, headaches and sad.  Associated symptoms include no helplessness and no hopelessness.  Past treatments include SSRIs - Selective serotonin reuptake inhibitors.  Past medical history includes anxiety.   Anxiety Presents for follow-up visit. Symptoms include depressed mood, irritability, nervous/anxious behavior and restlessness. Symptoms occur occasionally. The severity of symptoms is moderate.        Review of Systems  Constitutional: Positive for irritability and malaise/fatigue.  HENT: Positive for ear pain. Negative for ear discharge, rhinorrhea and sore throat.   Gastrointestinal: Negative for diarrhea.  Neurological: Positive for headaches.  Psychiatric/Behavioral: Positive for depression. The patient is nervous/anxious.   All other systems reviewed and are negative.    Observations/Objective: No SOB or distress noted   Assessment and Plan: 1. Other acute nonsuppurative otitis media of right ear, recurrence not specified - amoxicillin-clavulanate (AUGMENTIN) 875-125 MG tablet; Take 1 tablet by mouth 2 (two) times daily.  Dispense: 14 tablet; Refill: 0  2. Fatigue, unspecified type  3. Essential hypertension - lisinopril (ZESTRIL) 20 MG tablet; Take 1 tablet (20 mg total) by mouth daily. (Needs to be seen before next refill)  Dispense: 90 tablet; Refill: 2  4. Depression with anxiety - hydrOXYzine (VISTARIL) 25 MG capsule; Take 1 capsule (25 mg total) by mouth  3 (three) times daily as needed.  Dispense: 90 capsule; Refill: 2  5. Obesity (BMI 30-39.9)  6. Panic attack - hydrOXYzine  (VISTARIL) 25 MG capsule; Take 1 capsule (25 mg total) by mouth 3 (three) times daily as needed.  Dispense: 90 capsule; Refill: 2   Follow Up Instructions: 6 months   I discussed the assessment and treatment plan with the patient. The patient was provided an opportunity to ask questions and all were answered. The patient agreed with the plan and demonstrated an understanding of the instructions.   The patient was advised to call back or seek an in-person evaluation if the symptoms worsen or if the condition fails to improve as anticipated.  The above assessment and management plan was discussed with the patient. The patient verbalized understanding of and has agreed to the management plan. Patient is aware to call the clinic if symptoms persist or worsen. Patient is aware when to return to the clinic for a follow-up visit. Patient educated on when it is appropriate to go to the emergency department.   Time call ended:  1:01 pm  I provided 13 minutes of non-face-to-face time during this encounter.    [name], FNP

## 2019-09-12 ENCOUNTER — Other Ambulatory Visit: Payer: Self-pay | Admitting: Family

## 2019-09-12 MED ORDER — AMOXICILLIN-POT CLAVULANATE 250-62.5 MG/5ML PO SUSR: 875.0000 mg | Freq: Two times a day (BID) | ORAL | 0 refills | Status: AC

## 2019-09-23 ENCOUNTER — Telehealth: Payer: Self-pay | Admitting: Family

## 2019-09-23 NOTE — Telephone Encounter (Signed)
Call placed to [facility] pharmacy to check on price Patient uninsured, will attempt to use good rx $70.18

## 2019-10-07 ENCOUNTER — Other Ambulatory Visit: Payer: Self-pay | Admitting: Family

## 2019-10-07 NOTE — Telephone Encounter (Signed)
Informed pt that Rx was sent in on 09/11/19 with 2 refills, instructed her to ask for it by name instead of by Rx number

## 2019-10-07 NOTE — Telephone Encounter (Signed)
  Prescription Request  10/07/2019  What is the name of the medication or equipment? hydrOXYzine (VISTARIL) 25 MG capsule  Have you contacted your pharmacy to request a refill? (if applicable) no  Which pharmacy would you like this sent to? [facility]   Patient notified that their request is being sent to the clinical staff for review and that they should receive a response within 2 business days.

## 2019-10-09 ENCOUNTER — Ambulatory Visit: Payer: Self-pay | Admitting: Family

## 2019-10-13 ENCOUNTER — Encounter: Payer: Self-pay | Admitting: Family

## 2019-11-07 ENCOUNTER — Encounter: Payer: Self-pay | Admitting: Family

## 2019-11-07 ENCOUNTER — Ambulatory Visit (INDEPENDENT_AMBULATORY_CARE_PROVIDER_SITE_OTHER): Payer: Self-pay | Admitting: Family

## 2019-11-07 NOTE — Progress Notes (Signed)
   Virtual Visit via telephone Note Due to COVID-19 pandemic this visit was conducted virtually. This visit type was conducted due to national recommendations for restrictions regarding the COVID-19 Pandemic (e.g. social distancing, sheltering in place) in an effort to limit this patient's exposure and mitigate transmission in our community. All issues noted in this document were discussed and addressed.  A physical exam was not performed with this format.   Called patient, she requests we reschedule her visit. Will call back and schedule for another time.   [name], FNP

## 2019-11-23 ENCOUNTER — Emergency Department (HOSPITAL_BASED_OUTPATIENT_CLINIC_OR_DEPARTMENT_OTHER): Payer: Self-pay

## 2019-11-23 ENCOUNTER — Other Ambulatory Visit: Payer: Self-pay

## 2019-11-23 ENCOUNTER — Encounter (HOSPITAL_BASED_OUTPATIENT_CLINIC_OR_DEPARTMENT_OTHER): Payer: Self-pay | Admitting: Emergency Medicine

## 2019-11-23 ENCOUNTER — Emergency Department (HOSPITAL_BASED_OUTPATIENT_CLINIC_OR_DEPARTMENT_OTHER)
Admission: EM | Admit: 2019-11-23 | Discharge: 2019-11-23 | Disposition: A | Discharge status: Home / Self Care | Payer: Self-pay | Attending: Emergency Medicine | Admitting: Emergency Medicine

## 2019-11-23 DIAGNOSIS — I10 Essential (primary) hypertension: Secondary | ICD-10-CM | POA: Insufficient documentation

## 2019-11-23 DIAGNOSIS — G44319 Acute post-traumatic headache, not intractable: Secondary | ICD-10-CM

## 2019-11-23 DIAGNOSIS — Z79899 Other long term (current) drug therapy: Secondary | ICD-10-CM | POA: Insufficient documentation

## 2019-11-23 DIAGNOSIS — J45909 Unspecified asthma, uncomplicated: Secondary | ICD-10-CM | POA: Insufficient documentation

## 2019-11-23 DIAGNOSIS — S0083XA Contusion of other part of head, initial encounter: Secondary | ICD-10-CM

## 2019-11-23 DIAGNOSIS — F17210 Nicotine dependence, cigarettes, uncomplicated: Secondary | ICD-10-CM | POA: Insufficient documentation
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
## 2019-12-22 ENCOUNTER — Telehealth: Payer: Self-pay

## 2020-01-15 ENCOUNTER — Ambulatory Visit: Payer: Self-pay | Admitting: Family

## 2020-03-05 ENCOUNTER — Other Ambulatory Visit: Payer: Self-pay

## 2020-03-05 ENCOUNTER — Ambulatory Visit (INDEPENDENT_AMBULATORY_CARE_PROVIDER_SITE_OTHER): Payer: 59 | Admitting: Family

## 2020-03-05 ENCOUNTER — Encounter: Payer: Self-pay | Admitting: Family

## 2020-03-05 VITALS — BP 116/74 | HR 75 | Temp 97.9°F | Ht 63.0 in | Wt 188.0 lb

## 2020-03-05 DIAGNOSIS — E669 Obesity, unspecified: Secondary | ICD-10-CM

## 2020-03-05 DIAGNOSIS — I10 Essential (primary) hypertension: Secondary | ICD-10-CM | POA: Diagnosis not present

## 2020-03-05 DIAGNOSIS — Z1159 Encounter for screening for other viral diseases: Secondary | ICD-10-CM

## 2020-03-05 DIAGNOSIS — F418 Other specified anxiety disorders: Secondary | ICD-10-CM | POA: Diagnosis not present

## 2020-03-05 DIAGNOSIS — F410 Panic disorder [episodic paroxysmal anxiety] without agoraphobia: Secondary | ICD-10-CM

## 2020-03-05 DIAGNOSIS — Z79899 Other long term (current) drug therapy: Secondary | ICD-10-CM

## 2020-03-05 DIAGNOSIS — F902 Attention-deficit hyperactivity disorder, combined type: Secondary | ICD-10-CM

## 2020-03-05 DIAGNOSIS — F1010 Alcohol abuse, uncomplicated: Secondary | ICD-10-CM

## 2020-03-05 MED ORDER — HYDROXYZINE PAMOATE 25 MG PO CAPS: 25.0000 mg | ORAL_CAPSULE | Freq: Three times a day (TID) | ORAL | 2 refills | Status: DC | PRN

## 2020-03-05 MED ORDER — LISINOPRIL 20 MG PO TABS: 20.0000 mg | ORAL_TABLET | Freq: Every day | ORAL | 2 refills | Status: DC

## 2020-03-05 MED ORDER — ATOMOXETINE HCL 40 MG PO CAPS: 40.0000 mg | ORAL_CAPSULE | Freq: Every day | ORAL | 2 refills | Status: DC

## 2020-03-05 MED ORDER — FLUOXETINE HCL 20 MG PO CAPS: 60.0000 mg | ORAL_CAPSULE | Freq: Every day | ORAL | 1 refills | Status: DC

## 2020-03-05 NOTE — Progress Notes (Signed)
Subjective:    Patient ID: Tonya Franklin, female    DOB: [birth year], 25 y.o.   MRN: [id number]  Chief Complaint  Patient presents with  . ADHD   Pt presents to the office today for chronic follow up. Pt is followed by GYN annually for pap and has IUD in place.   She reports she has decreased her alcohol to 2 times a week and reports she drinks 2-3 beers.    She reports she has restarted college. She reports this is her fifth time trying to go to college. She reports while doing school work she can not focus and loses concentration. She can not complete tasks. She had to write an [place] paper and it took her days to do. She is worried she is going to fail and wants to try succeed this time.  Hypertension This is a chronic problem. The current episode started more than 1 year ago. The problem has been resolved since onset. The problem is controlled. Associated symptoms include anxiety. Pertinent negatives include no malaise/fatigue, peripheral edema or shortness of breath. Risk factors for coronary artery disease include obesity. The current treatment provides moderate improvement. There is no history of kidney disease, CVA or heart failure.  Anxiety Presents for follow-up visit. Symptoms include depressed mood, excessive worry, irritability, nervous/anxious behavior and restlessness. Patient reports no shortness of breath. Symptoms occur most days. The severity of symptoms is moderate.    Depression        This is a chronic problem.  The current episode started more than 1 year ago.   The problem occurs intermittently.  Associated symptoms include irritable and restlessness.  Associated symptoms include no helplessness and no hopelessness.  Past treatments include SSRIs - Selective serotonin reuptake inhibitors.  Past medical history includes anxiety.       Review of Systems  Constitutional: Positive for irritability. Negative for malaise/fatigue.  Respiratory: Negative for shortness  of breath.   Psychiatric/Behavioral: Positive for depression. The patient is nervous/anxious.   All other systems reviewed and are negative.      Objective:   Physical Exam Vitals reviewed.  Constitutional:      General: She is irritable. She is not in acute distress.    Appearance: She is well-developed and well-nourished.  HENT:     Head: Normocephalic and atraumatic.     Right Ear: Tympanic membrane normal.     Left Ear: Tympanic membrane normal.     Mouth/Throat:     Mouth: Oropharynx is clear and moist.  Eyes:     Pupils: Pupils are equal, round, and reactive to light.  Neck:     Thyroid: No thyromegaly.  Cardiovascular:     Rate and Rhythm: Normal rate and regular rhythm.     Pulses: Intact distal pulses.     Heart sounds: Normal heart sounds. No murmur heard.   Pulmonary:     Effort: Pulmonary effort is normal. No respiratory distress.     Breath sounds: Normal breath sounds. No wheezing.  Abdominal:     General: Bowel sounds are normal. There is no distension.     Palpations: Abdomen is soft.     Tenderness: There is no abdominal tenderness.  Musculoskeletal:        General: No tenderness or edema. Normal range of motion.     Cervical back: Normal range of motion and neck supple.  Skin:    General: Skin is warm and dry.  Neurological:     Mental  Status: She is alert and oriented to person, place, and time.     Cranial Nerves: No cranial nerve deficit.     Deep Tendon Reflexes: Reflexes are normal and symmetric.  Psychiatric:        Mood and Affect: Mood and affect normal.        Behavior: Behavior normal.        Thought Content: Thought content normal.        Judgment: Judgment normal.       BP 116/74   Pulse 75   Temp 97.9 F (36.6 C) (Temporal)   Ht 5' 3"  (1.6 m)   Wt 188 lb (85.3 kg)   BMI 33.30 kg/m      Assessment & Plan:  Tonya Franklin comes in today with chief complaint of ADHD   Diagnosis and orders addressed:  1. Depression  with anxiety - hydrOXYzine (VISTARIL) 25 MG capsule; Take 1 capsule (25 mg total) by mouth 3 (three) times daily as needed.  Dispense: 90 capsule; Refill: 2 - CMP14+EGFR  2. Panic attack - hydrOXYzine (VISTARIL) 25 MG capsule; Take 1 capsule (25 mg total) by mouth 3 (three) times daily as needed.  Dispense: 90 capsule; Refill: 2 - CMP14+EGFR  3. Essential hypertension - lisinopril (ZESTRIL) 20 MG tablet; Take 1 tablet (20 mg total) by mouth daily. (Needs to be seen before next refill)  Dispense: 90 tablet; Refill: 2 - CMP14+EGFR  4. Primary hypertension - CMP14+EGFR  5. Obesity (BMI 30-39.9) - CMP14+EGFR  6. Alcohol abuse - CMP14+EGFR  7. Attention deficit hyperactivity disorder (ADHD), combined type Start Strattera 40 mg Behavior modification as needed Follow-up for recheck in 1 months - atomoxetine (STRATTERA) 40 MG capsule; Take 1 capsule (40 mg total) by mouth daily.  Dispense: 30 capsule; Refill: 2 - CMP14+EGFR - ToxASSURE Select 13 (MW), Urine  8. Need for hepatitis C screening test - CMP14+EGFR - Hepatitis C antibody  9. Controlled substance agreement signed - CMP14+EGFR - ToxASSURE Select 13 (MW), Urine   Labs pending Health Maintenance reviewed Diet and exercise encouraged  Follow up plan: 1 month for ADHD    Tonya Dun, FNP

## 2020-03-05 NOTE — Patient Instructions (Signed)

## 2020-03-06 LAB — CMP14+EGFR
ALT: 31 IU/L (ref 0–32)
AST: 20 IU/L (ref 0–40)
Albumin/Globulin Ratio: 1.8 (ref 1.2–2.2)
Albumin: 4.3 g/dL (ref 3.9–5.0)
Alkaline Phosphatase: 47 IU/L (ref 44–121)
BUN/Creatinine Ratio: 16 (ref 9–23)
BUN: 13 mg/dL (ref 6–20)
Bilirubin Total: 0.2 mg/dL (ref 0.0–1.2)
CO2: 21 mmol/L (ref 20–29)
Calcium: 8.9 mg/dL (ref 8.7–10.2)
Chloride: 102 mmol/L (ref 96–106)
Creatinine, Ser: 0.79 mg/dL (ref 0.57–1.00)
GFR calc Af Amer: 120 mL/min/{1.73_m2} (ref >59)
GFR calc non Af Amer: 104 mL/min/{1.73_m2} (ref >59)
Globulin, Total: 2.4 g/dL (ref 1.5–4.5)
Glucose: 104 mg/dL — ABNORMAL HIGH (ref 65–99)
Potassium: 4.5 mmol/L (ref 3.5–5.2)
Sodium: 138 mmol/L (ref 134–144)
Total Protein: 6.7 g/dL (ref 6.0–8.5)

## 2020-03-06 LAB — HEPATITIS C ANTIBODY: Hep C Virus Ab: <0.1 s/co ratio (ref 0.0–0.9)

## 2020-03-08 ENCOUNTER — Other Ambulatory Visit: Payer: Self-pay | Admitting: Family

## 2020-03-08 MED ORDER — LISDEXAMFETAMINE DIMESYLATE 40 MG PO CAPS: 40.0000 mg | ORAL_CAPSULE | ORAL | 0 refills | Status: DC

## 2020-03-08 MED ORDER — LISDEXAMFETAMINE DIMESYLATE 40 MG PO CAPS: 40.0000 mg | ORAL_CAPSULE | Freq: Every day | ORAL | 0 refills | Status: DC

## 2020-03-12 ENCOUNTER — Telehealth: Payer: Self-pay

## 2020-03-12 NOTE — Telephone Encounter (Signed)
Pt needs work note for [employer] 03/05/20.

## 2020-03-12 NOTE — Telephone Encounter (Signed)
Work note sent to [employer].  Patient aware

## 2020-03-16 LAB — TOXASSURE SELECT 13 (MW), URINE

## 2020-04-06 ENCOUNTER — Other Ambulatory Visit: Payer: Self-pay

## 2020-04-06 ENCOUNTER — Encounter: Payer: Self-pay | Admitting: Family

## 2020-04-06 ENCOUNTER — Ambulatory Visit: Payer: 59 | Admitting: Family

## 2020-04-06 VITALS — BP 118/76 | HR 69 | Temp 97.6°F | Ht 63.0 in | Wt 188.0 lb

## 2020-04-06 DIAGNOSIS — F909 Attention-deficit hyperactivity disorder, unspecified type: Secondary | ICD-10-CM | POA: Diagnosis not present

## 2020-04-06 DIAGNOSIS — W503XXA Accidental bite by another person, initial encounter: Secondary | ICD-10-CM | POA: Diagnosis not present

## 2020-04-06 DIAGNOSIS — F321 Major depressive disorder, single episode, moderate: Secondary | ICD-10-CM

## 2020-04-06 MED ORDER — METHYLPHENIDATE HCL ER (OSM) 27 MG PO TBCR: 27.0000 mg | EXTENDED_RELEASE_TABLET | Freq: Every day | ORAL | 0 refills | Status: DC

## 2020-04-06 NOTE — Patient Instructions (Signed)
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
## 2020-06-03 ENCOUNTER — Ambulatory Visit: Payer: Self-pay | Admitting: Family

## 2020-06-10 ENCOUNTER — Encounter: Payer: Self-pay | Admitting: Family

## 2020-06-10 ENCOUNTER — Telehealth (INDEPENDENT_AMBULATORY_CARE_PROVIDER_SITE_OTHER): Payer: 59 | Admitting: Family

## 2020-06-10 DIAGNOSIS — I10 Essential (primary) hypertension: Secondary | ICD-10-CM

## 2020-06-10 DIAGNOSIS — F909 Attention-deficit hyperactivity disorder, unspecified type: Secondary | ICD-10-CM

## 2020-06-10 DIAGNOSIS — F411 Generalized anxiety disorder: Secondary | ICD-10-CM

## 2020-06-10 DIAGNOSIS — F321 Major depressive disorder, single episode, moderate: Secondary | ICD-10-CM

## 2020-06-10 DIAGNOSIS — F1010 Alcohol abuse, uncomplicated: Secondary | ICD-10-CM

## 2020-06-10 DIAGNOSIS — E669 Obesity, unspecified: Secondary | ICD-10-CM | POA: Diagnosis not present

## 2020-06-10 DIAGNOSIS — F429 Obsessive-compulsive disorder, unspecified: Secondary | ICD-10-CM | POA: Diagnosis not present

## 2020-06-10 DIAGNOSIS — Z79899 Other long term (current) drug therapy: Secondary | ICD-10-CM

## 2020-06-10 MED ORDER — METHYLPHENIDATE HCL ER (OSM) 36 MG PO TBCR: 36.0000 mg | EXTENDED_RELEASE_TABLET | Freq: Every day | ORAL | 0 refills | Status: DC

## 2020-06-10 NOTE — Progress Notes (Signed)
Virtual Visit  Note Due to COVID-19 pandemic this visit was conducted virtually. This visit type was conducted due to national recommendations for restrictions regarding the COVID-19 Pandemic (e.g. social distancing, sheltering in place) in an effort to limit this patient's exposure and mitigate transmission in our community. All issues noted in this document were discussed and addressed.  A physical exam was not performed with this format.  I connected with Tonya Franklin on 06/10/20 at 4:06 pm  by video and verified that I am speaking with the correct person using two identifiers. Tonya Franklin is currently located at home and  No one is currently with her  during visit. The provider, [name], FNP is located in their office at time of visit.  I discussed the limitations, risks, security and privacy concerns of performing an evaluation and management service by video  and the availability of in person appointments. I also discussed with the patient that there may be a patient responsible charge related to this service. The patient expressed understanding and agreed to proceed.   History and Present Illness:   Pt presents to the office today for chronic follow up. Pt is followed by GYN annually for pap and has IUD in place.   She reports she has decreased her alcohol to 1 timesa week and reports she drinks 2-3 beers.    She reports her Concerta is helping her stay focused at times, but wears off in 2 hours. Does report it makes her sleepy.  Hypertension This is a chronic problem. The current episode started more than 1 year ago. The problem has been resolved since onset. The problem is controlled. Associated symptoms include anxiety and peripheral edema ("at times"). Pertinent negatives include no malaise/fatigue or shortness of breath. Risk factors for coronary artery disease include dyslipidemia, obesity and smoking/tobacco exposure. The current treatment provides moderate  improvement.  Anxiety Presents for follow-up visit. Symptoms include depressed mood, excessive worry, irritability, nervous/anxious behavior, panic and restlessness. Patient reports no shortness of breath. Symptoms occur most days. The severity of symptoms is moderate. The quality of sleep is good.    Depression        This is a chronic problem.  The current episode started more than 1 year ago.   The onset quality is gradual.   The problem occurs intermittently.  Associated symptoms include irritable, restlessness and sad.  Associated symptoms include no helplessness and no hopelessness.  Past treatments include SSRIs - Selective serotonin reuptake inhibitors.  Compliance with treatment is good.  Past medical history includes anxiety.       Review of Systems  Constitutional: Positive for irritability. Negative for malaise/fatigue.  Respiratory: Negative for shortness of breath.   Psychiatric/Behavioral: Positive for depression. The patient is nervous/anxious.   All other systems reviewed and are negative.    Observations/Objective: No SOB or distress noted  Assessment and Plan: 1. Primary hypertension  2. Obsessive-compulsive disorder, unspecified type  3. Obesity (BMI 30-39.9)  4. Alcohol abuse  5. Depression, major, single episode, moderate (HCC)  6. GAD (generalized anxiety disorder)   7. Attention deficit hyperactivity disorder (ADHD), unspecified ADHD type Will increase Concerta to 36 mg Meds as prescribed Behavior modification as needed Follow-up for recheck in 3 months - methylphenidate (CONCERTA) 36 MG PO CR tablet; Take 1 tablet (36 mg total) by mouth daily before breakfast.  Dispense: 30 tablet; Refill: 0 - methylphenidate (CONCERTA) 36 MG PO CR tablet; Take 1 tablet (36 mg total) by mouth  daily before breakfast.  Dispense: 30 tablet; Refill: 0 - methylphenidate (CONCERTA) 36 MG PO CR tablet; Take 1 tablet (36 mg total) by mouth daily before breakfast.  Dispense:  30 tablet; Refill: 0  8. Controlled substance agreement signed - methylphenidate (CONCERTA) 36 MG PO CR tablet; Take 1 tablet (36 mg total) by mouth daily before breakfast.  Dispense: 30 tablet; Refill: 0 - methylphenidate (CONCERTA) 36 MG PO CR tablet; Take 1 tablet (36 mg total) by mouth daily before breakfast.  Dispense: 30 tablet; Refill: 0 - methylphenidate (CONCERTA) 36 MG PO CR tablet; Take 1 tablet (36 mg total) by mouth daily before breakfast.  Dispense: 30 tablet; Refill: 0    I discussed the assessment and treatment plan with the patient. The patient was provided an opportunity to ask questions and all were answered. The patient agreed with the plan and demonstrated an understanding of the instructions.   The patient was advised to call back or seek an in-person evaluation if the symptoms worsen or if the condition fails to improve as anticipated.  The above assessment and management plan was discussed with the patient. The patient verbalized understanding of and has agreed to the management plan. Patient is aware to call the clinic if symptoms persist or worsen. Patient is aware when to return to the clinic for a follow-up visit. Patient educated on when it is appropriate to go to the emergency department.   Time call ended: 4:20pm    I provided 14  minutes of   face-to-face time during this encounter.    [name], FNP

## 2020-06-30 ENCOUNTER — Other Ambulatory Visit: Payer: Self-pay | Admitting: Family

## 2020-06-30 DIAGNOSIS — I10 Essential (primary) hypertension: Secondary | ICD-10-CM

## 2020-07-12 ENCOUNTER — Ambulatory Visit (INDEPENDENT_AMBULATORY_CARE_PROVIDER_SITE_OTHER): Payer: 59 | Admitting: Family

## 2020-07-12 ENCOUNTER — Other Ambulatory Visit: Payer: Self-pay

## 2020-07-12 ENCOUNTER — Encounter: Payer: Self-pay | Admitting: Family

## 2020-07-12 VITALS — BP 121/81 | HR 89 | Temp 96.7°F | Ht 63.0 in | Wt 187.2 lb

## 2020-07-12 DIAGNOSIS — F411 Generalized anxiety disorder: Secondary | ICD-10-CM | POA: Diagnosis not present

## 2020-07-12 DIAGNOSIS — F321 Major depressive disorder, single episode, moderate: Secondary | ICD-10-CM

## 2020-07-12 DIAGNOSIS — I10 Essential (primary) hypertension: Secondary | ICD-10-CM | POA: Diagnosis not present

## 2020-07-12 DIAGNOSIS — E669 Obesity, unspecified: Secondary | ICD-10-CM

## 2020-07-12 MED ORDER — FLUOXETINE HCL 20 MG PO TABS: 60.0000 mg | ORAL_TABLET | Freq: Every day | ORAL | 3 refills | Status: DC

## 2020-07-12 MED ORDER — BUPROPION HCL ER (XL) 150 MG PO TB24: 150.0000 mg | ORAL_TABLET | Freq: Every day | ORAL | 1 refills | Status: DC

## 2020-07-12 MED ORDER — FLUOXETINE HCL 40 MG PO CAPS: 40.0000 mg | ORAL_CAPSULE | Freq: Every day | ORAL | 3 refills | Status: DC

## 2020-07-12 NOTE — Progress Notes (Signed)
Subjective:    Patient ID: Tonya Franklin, female    DOB: 13-Mar-1995, 25 y.o.   MRN: [id number]  Chief Complaint  Patient presents with   Hypertension   PT presents to the office today with elevated BP at home. She reports her BP on Friday was 147/90. She reports her heart rate has been increased.   She states her friends mother was in a MVA and died. Since then her anxiety has been increased.  Hypertension This is a new problem. The current episode started 1 to 4 weeks ago. The problem has been waxing and waning since onset. Associated symptoms include anxiety and malaise/fatigue. Pertinent negatives include no peripheral edema or shortness of breath. Agents associated with hypertension include amphetamines. Risk factors for coronary artery disease include dyslipidemia, obesity and sedentary lifestyle. The current treatment provides moderate improvement. There is no history of heart failure.  Anxiety Presents for follow-up visit. Symptoms include depressed mood, excessive worry, insomnia, irritability, nervous/anxious behavior and restlessness. Patient reports no shortness of breath. Symptoms occur most days. The severity of symptoms is moderate.    Depression        The onset quality is gradual.   The problem occurs intermittently.  Associated symptoms include insomnia, irritable and restlessness.  Associated symptoms include no helplessness and no hopelessness.  Past treatments include SSRIs - Selective serotonin reuptake inhibitors.  Past medical history includes anxiety.      Review of Systems  Constitutional:  Positive for irritability and malaise/fatigue.  Respiratory:  Negative for shortness of breath.   Psychiatric/Behavioral:  Positive for depression. The patient is nervous/anxious and has insomnia.   All other systems reviewed and are negative.     Objective:   Physical Exam Vitals reviewed.  Constitutional:      General: She is irritable. She is not in acute  distress.    Appearance: She is well-developed. She is obese.  HENT:     Head: Normocephalic and atraumatic.     Right Ear: Tympanic membrane normal.     Left Ear: Tympanic membrane normal.  Eyes:     Pupils: Pupils are equal, round, and reactive to light.  Neck:     Thyroid: No thyromegaly.  Cardiovascular:     Rate and Rhythm: Normal rate and regular rhythm.     Heart sounds: Normal heart sounds. No murmur heard. Pulmonary:     Effort: Pulmonary effort is normal. No respiratory distress.     Breath sounds: Normal breath sounds. No wheezing.  Abdominal:     General: Bowel sounds are normal. There is no distension.     Palpations: Abdomen is soft.     Tenderness: There is no abdominal tenderness.  Musculoskeletal:        General: No tenderness. Normal range of motion.     Cervical back: Normal range of motion and neck supple.  Skin:    General: Skin is warm and dry.  Neurological:     Mental Status: She is alert and oriented to person, place, and time.     Cranial Nerves: No cranial nerve deficit.     Deep Tendon Reflexes: Reflexes are normal and symmetric.  Psychiatric:        Mood and Affect: Mood is anxious.        Behavior: Behavior normal.        Thought Content: Thought content normal.        Judgment: Judgment normal.      BP 121/81   Pulse  89   Temp (!) 96.7 F (35.9 C) (Temporal)   Ht 5\' 3"  (1.6 m)   Wt 187 lb 3.2 oz (84.9 kg)   BMI 33.16 kg/m      Assessment & Plan:   Tonya Franklin comes in today with chief complaint of Hypertension   Diagnosis and orders addressed:  1. Primary hypertension  2. Obesity (BMI 30-39.9)  3. Depression, major, single episode, moderate (HCC) - FLUoxetine (PROZAC) 20 MG tablet; Take 3 tablets (60 mg total) by mouth daily.  Dispense: 90 tablet; Refill: 3 - buPROPion (WELLBUTRIN XL) 150 MG 24 hr tablet; Take 1 tablet (150 mg total) by mouth daily.  Dispense: 90 tablet; Refill: 1  4. GAD (generalized anxiety  disorder) - FLUoxetine (PROZAC) 20 MG tablet; Take 3 tablets (60 mg total) by mouth daily.  Dispense: 90 tablet; Refill: 3 - buPROPion (WELLBUTRIN XL) 150 MG 24 hr tablet; Take 1 tablet (150 mg total) by mouth daily.  Dispense: 90 tablet; Refill: 1  Will add Wellbutrin 150 mg today Stress management Continue Lisinopril  RTO in 4-6 weeks   Beverely Low, FNP

## 2020-07-12 NOTE — Patient Instructions (Signed)

## 2020-09-10 ENCOUNTER — Ambulatory Visit (INDEPENDENT_AMBULATORY_CARE_PROVIDER_SITE_OTHER): Payer: 59 | Admitting: Family Medicine

## 2020-09-10 ENCOUNTER — Encounter: Payer: Self-pay | Admitting: Family Medicine

## 2020-09-10 DIAGNOSIS — J069 Acute upper respiratory infection, unspecified: Secondary | ICD-10-CM

## 2020-09-10 MED ORDER — PSEUDOEPH-BROMPHEN-DM 30-2-10 MG/5ML PO SYRP: 5.0000 mL | ORAL_SOLUTION | Freq: Four times a day (QID) | ORAL | 0 refills | Status: DC | PRN

## 2020-09-10 NOTE — Progress Notes (Signed)
Virtual Visit via telephone Note Due to COVID-19 pandemic this visit was conducted virtually. This visit type was conducted due to national recommendations for restrictions regarding the COVID-19 Pandemic (e.g. social distancing, sheltering in place) in an effort to limit this patient's exposure and mitigate transmission in our community. All issues noted in this document were discussed and addressed.  A physical exam was not performed with this format.   I connected with Tonya Franklin on 09/10/2020 at 0905 by telephone and verified that I am speaking with the correct person using two identifiers. Tonya Franklin is currently located at home and family is currently with them during visit. The provider, Kari Baars, FNP is located in their office at time of visit.  I discussed the limitations, risks, security and privacy concerns of performing an evaluation and management service by telephone and the availability of in person appointments. I also discussed with the patient that there may be a patient responsible charge related to this service. The patient expressed understanding and agreed to proceed.  Subjective:  Patient ID: Tonya Franklin, female    DOB: [birth year], 25 y.o.   MRN: [id number]  Chief Complaint:  URI   HPI: Tonya Franklin is a 25 y.o. female presenting on 09/10/2020 for URI   URI symptoms for 3 days. Reports around someone sick 5 days ago, unknown what they were diagnosed with.   URI  This is a new problem. The current episode started in the past 7 days. The problem has been gradually worsening. There has been no fever. Associated symptoms include congestion, coughing, headaches, rhinorrhea and wheezing. Pertinent negatives include no abdominal pain, chest pain, diarrhea, dysuria, ear pain, joint pain, joint swelling, nausea, neck pain, plugged ear sensation, rash, sinus pain, sneezing, sore throat, swollen glands or vomiting. She has tried decongestant for the  symptoms. The treatment provided no relief.    Relevant past medical, surgical, family, and social history reviewed and updated as indicated.  Allergies and medications reviewed and updated.   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   02/08/15 [facility] LGSIL/mild dysplasia, HPV testing not done- never went for colpo, wants to f/u here     Colpo:____    Mood swings 2014   Obesity (BMI 30-39.9) 06/29/2017   Obsessive-compulsive disorder 06/29/2017   Right leg swelling 03/22/2015    Past Surgical History:  Procedure Laterality Date   adnoids     CYST EXCISION     cyst removed from face    Social History   Socioeconomic History   Marital status: Single    Spouse name: Not on file   Number of children: Not on file   Years of education: Not on file   Highest education level: Not on file  Occupational History   Not on file  Tobacco Use   Smoking status: Some Days    Types: Cigarettes    Last attempt to quit: 03/19/2014    Years since quitting: 6.4   Smokeless tobacco: Never  Vaping Use   Vaping Use: Never used  Substance and Sexual Activity   Alcohol use: No    Comment: occ   Drug use: No    Comment: occ   Sexual activity: Yes  Birth control/protection: Condom  Other Topics Concern   Not on file  Social History Narrative   Not on file   Social Determinants of Health   Financial Resource Strain: Not on file  Food Insecurity: Not on file  Transportation Needs: Not on file  Physical Activity: Not on file  Stress: Not on file  Social Connections: Not on file  Intimate Partner Violence: Not on file    Outpatient Encounter Medications as of 09/10/2020  Medication Sig   brompheniramine-pseudoephedrine-DM 30-2-10 MG/5ML syrup  Take 5 mLs by mouth 4 (four) times daily as needed.   buPROPion (WELLBUTRIN XL) 150 MG 24 hr tablet Take 1 tablet (150 mg total) by mouth daily.   EPINEPHrine 0.3 mg/0.3 mL IJ SOAJ injection Inject 0.3 mLs (0.3 mg total) into the muscle as needed for anaphylaxis.   FLUoxetine (PROZAC) 20 MG tablet Take 3 tablets (60 mg total) by mouth daily.   hydrOXYzine (VISTARIL) 25 MG capsule Take 1 capsule (25 mg total) by mouth 3 (three) times daily as needed.   levonorgestrel (MIRENA) 20 MCG/24HR IUD by Intrauterine route.   lisinopril (ZESTRIL) 20 MG tablet Take 1 tablet (20 mg total) by mouth daily.   methylphenidate (CONCERTA) 36 MG PO CR tablet Take 1 tablet (36 mg total) by mouth daily before breakfast.   methylphenidate (CONCERTA) 36 MG PO CR tablet Take 1 tablet (36 mg total) by mouth daily before breakfast.   methylphenidate (CONCERTA) 36 MG PO CR tablet Take 1 tablet (36 mg total) by mouth daily before breakfast.   No facility-administered encounter medications on file as of 09/10/2020.    Allergies  Allergen Reactions   Cefuroxime Axetil Other (See Comments)    Unknown    Review of Systems  Constitutional:  Positive for activity change and fatigue. Negative for appetite change, chills, diaphoresis, fever and unexpected weight change.  HENT:  Positive for congestion, postnasal drip and rhinorrhea. Negative for dental problem, drooling, ear discharge, ear pain, facial swelling, hearing loss, mouth sores, nosebleeds, sinus pressure, sinus pain, sneezing, sore throat, trouble swallowing and voice change.   Eyes: Negative.   Respiratory:  Positive for cough and wheezing. Negative for apnea, choking, chest tightness and shortness of breath.   Cardiovascular:  Negative for chest pain, palpitations and leg swelling.  Gastrointestinal:  Negative for abdominal pain, blood in stool, constipation, diarrhea, nausea and vomiting.  Endocrine: Negative.   Genitourinary:  Negative for decreased urine  volume, dysuria, frequency and urgency.  Musculoskeletal:  Negative for arthralgias, joint pain, myalgias and neck pain.  Skin: Negative.  Negative for rash.  Allergic/Immunologic: Negative.   Neurological:  Positive for headaches. Negative for dizziness, tremors, seizures, syncope, facial asymmetry, speech difficulty, weakness, light-headedness and numbness.  Hematological: Negative.   Psychiatric/Behavioral:  Negative for confusion, hallucinations, sleep disturbance and suicidal ideas.   All other systems reviewed and are negative.       Observations/Objective: No vital signs or physical exam, this was a telephone or virtual health encounter.  Pt alert and oriented, answers all questions appropriately, and able to speak in full sentences.    Assessment and Plan: Tonya Franklin was seen today for uri.  Diagnoses and all orders for this visit:  URI with cough and congestion No indications of acute bacterial infection. Symptoms concerning for COVID-19, pt will come to clinic to be tested. Symptomatic care discussed in detail. Medications as prescribed. Report any new, worsening, or persistent symptoms.  -     brompheniramine-pseudoephedrine-DM 30-2-10 MG/5ML syrup; Take  5 mLs by mouth 4 (four) times daily as needed. -     Novel Coronavirus, NAA ([facility])    Follow Up Instructions: Return if symptoms worsen or fail to improve.    I discussed the assessment and treatment plan with the patient. The patient was provided an opportunity to ask questions and all were answered. The patient agreed with the plan and demonstrated an understanding of the instructions.   The patient was advised to call back or seek an in-person evaluation if the symptoms worsen or if the condition fails to improve as anticipated.  The above assessment and management plan was discussed with the patient. The patient verbalized understanding of and has agreed to the management plan. Patient is aware to call the clinic  if they develop any new symptoms or if symptoms persist or worsen. Patient is aware when to return to the clinic for a follow-up visit. Patient educated on when it is appropriate to go to the emergency department.    I provided 12 minutes of non-face-to-face time during this encounter. The call started at 0903. The call ended at 0915. The other time was used for coordination of care.    Kari Baars, FNP-C [facility] [address] [place], [place] [address] [phone] 09/10/2020

## 2020-09-11 ENCOUNTER — Emergency Department (HOSPITAL_BASED_OUTPATIENT_CLINIC_OR_DEPARTMENT_OTHER)
Admission: EM | Admit: 2020-09-11 | Discharge: 2020-09-11 | Disposition: A | Discharge status: Home / Self Care | Payer: 59 | Attending: Emergency Medicine | Admitting: Emergency Medicine

## 2020-09-11 ENCOUNTER — Encounter (HOSPITAL_BASED_OUTPATIENT_CLINIC_OR_DEPARTMENT_OTHER): Payer: Self-pay

## 2020-09-11 ENCOUNTER — Emergency Department (HOSPITAL_BASED_OUTPATIENT_CLINIC_OR_DEPARTMENT_OTHER): Payer: 59 | Admitting: Radiology

## 2020-09-11 ENCOUNTER — Other Ambulatory Visit: Payer: Self-pay

## 2020-09-11 DIAGNOSIS — F17210 Nicotine dependence, cigarettes, uncomplicated: Secondary | ICD-10-CM | POA: Diagnosis not present

## 2020-09-11 DIAGNOSIS — S99911A Unspecified injury of right ankle, initial encounter: Secondary | ICD-10-CM | POA: Diagnosis present

## 2020-09-11 DIAGNOSIS — J45909 Unspecified asthma, uncomplicated: Secondary | ICD-10-CM | POA: Insufficient documentation

## 2020-09-11 DIAGNOSIS — S92001A Unspecified fracture of right calcaneus, initial encounter for closed fracture: Secondary | ICD-10-CM | POA: Diagnosis not present

## 2020-09-11 DIAGNOSIS — I10 Essential (primary) hypertension: Secondary | ICD-10-CM | POA: Diagnosis not present

## 2020-09-11 DIAGNOSIS — Y9368 Activity, volleyball (beach) (court): Secondary | ICD-10-CM | POA: Insufficient documentation

## 2020-09-11 DIAGNOSIS — Y30XXXA Falling, jumping or pushed from a high place, undetermined intent, initial encounter: Secondary | ICD-10-CM | POA: Diagnosis not present

## 2020-09-11 DIAGNOSIS — Z79899 Other long term (current) drug therapy: Secondary | ICD-10-CM | POA: Diagnosis not present

## 2020-09-11 DIAGNOSIS — S82891A Other fracture of right lower leg, initial encounter for closed fracture: Secondary | ICD-10-CM

## 2020-09-11 LAB — SARS-COV-2, NAA 2 DAY TAT

## 2020-09-11 LAB — NOVEL CORONAVIRUS, NAA: SARS-CoV-2, NAA: NOT DETECTED

## 2020-09-11 NOTE — ED Triage Notes (Signed)
Pt states that she was playing volleyball and landed on her R ankle poorly causing deformity. Pt denies head injury/LOC. No nausea or vomiting since.

## 2020-09-11 NOTE — Discharge Instructions (Addendum)
You have a fibular avulsion fracture.  You may weight-bear as tolerated. Follow-up with orthopedic surgery within the week.  Return back to the ER if you have worsening pain fevers or any additional concerns.

## 2020-09-11 NOTE — ED Notes (Signed)
Ice pack given with instructions not to ice more than 20 minutes

## 2020-09-11 NOTE — ED Provider Notes (Signed)
[facility] EMERGENCY DEPT Provider Note   CSN: [id number] Arrival date & time: 09/11/20  1356     History Chief Complaint  Patient presents with   ankle deformity    ABBEY Franklin is a 25 y.o. female.  Patient complaining chief complaint of right ankle pain.  She states she was playing volleyball jumped up and down and and landed funny on her right ankle just prior to arrival here.  Complaining of persistent pain at the right ankle particularly in the lateral aspect.  Denies injury elsewhere.  Denies knee pain or hip pain.  No loss of consciousness or head injury.  No recent illnesses no fever vomiting cough diarrhea.      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 LGSIL/mild dysplasia, HPV testing not done- never went for colpo, wants to f/u here     Colpo:____    Mood swings 2014   Obesity (BMI 30-39.9) 06/29/2017   Obsessive-compulsive disorder 06/29/2017   Right leg swelling 03/22/2015    Patient Active Problem List   Diagnosis Date Noted   Depression, major, single episode, moderate (HCC) 06/10/2020   GAD (generalized anxiety disorder) 06/10/2020   Hypertension 05/28/2018   Obsessive-compulsive disorder 06/29/2017   Alcohol abuse 06/29/2017   Obesity (BMI 30-39.9) 06/29/2017   IUD (intrauterine device) in place 10/23/2016   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    Past Surgical History:  Procedure Laterality Date   adnoids     CYST EXCISION     cyst removed from face     OB History   No obstetric history on file.     Family History  Problem Relation Age of Onset   Diabetes Father    Hyperlipidemia Father    Hypertension Father    Cancer Paternal  Grandmother        lung   Cancer Other        breast   Cancer Other        ovarian and cervical    Social History   Tobacco Use   Smoking status: Some Days    Types: Cigarettes    Last attempt to quit: 03/19/2014    Years since quitting: 6.4   Smokeless tobacco: Never  Vaping Use   Vaping Use: Never used  Substance Use Topics   Alcohol use: No    Comment: occ   Drug use: No    Comment: occ    Home Medications Prior to Admission medications   Medication Sig Start Date End Date Taking? Authorizing Provider  FLUoxetine (PROZAC) 20 MG tablet Take 3 tablets (60 mg total) by mouth daily. 07/12/20  Yes Hawks, Christy A, FNP  hydrOXYzine (VISTARIL) 25 MG capsule Take 1 capsule (25 mg total) by mouth 3 (three) times daily as needed. 03/05/20  Yes Hawks, Christy A, FNP  lisinopril (ZESTRIL) 20 MG tablet Take 1 tablet (20 mg total) by mouth daily. 06/30/20  Yes Hawks, Christy A, FNP  brompheniramine-pseudoephedrine-DM 30-2-10 MG/5ML syrup Take 5 mLs by mouth 4 (four) times daily as needed. 09/10/20   Sonny Masters, FNP  buPROPion (WELLBUTRIN XL) 150 MG 24 hr tablet Take 1 tablet (150 mg total) by mouth daily. 07/12/20   Hawks,  Christy A, FNP  EPINEPHrine 0.3 mg/0.3 mL IJ SOAJ injection Inject 0.3 mLs (0.3 mg total) into the muscle as needed for anaphylaxis. 12/04/18   Robinson, [place] N, PA-C  levonorgestrel (MIRENA) 20 MCG/24HR IUD by Intrauterine route.    [provider]  methylphenidate (CONCERTA) 36 MG PO CR tablet Take 1 tablet (36 mg total) by mouth daily before breakfast. 06/10/20 07/10/20  Junie Spencer, FNP  methylphenidate (CONCERTA) 36 MG PO CR tablet Take 1 tablet (36 mg total) by mouth daily before breakfast. 07/10/20 08/09/20  Junie Spencer, FNP  methylphenidate (CONCERTA) 36 MG PO CR tablet Take 1 tablet (36 mg total) by mouth daily before breakfast. 08/09/20 09/08/20  [name] A, FNP    Allergies    Cefuroxime axetil  Review of Systems   Review of Systems   Constitutional:  Negative for fever.  HENT:  Negative for ear pain.   Eyes:  Negative for pain.  Respiratory:  Negative for cough.   Cardiovascular:  Negative for chest pain.  Gastrointestinal:  Negative for abdominal pain.  Genitourinary:  Negative for flank pain.  Musculoskeletal:  Negative for back pain.  Skin:  Negative for rash.  Neurological:  Negative for headaches.   Physical Exam Updated Vital Signs BP 116/64 (BP Location: Right Arm)   Pulse 74   Temp 98.3 F (36.8 C) (Oral)   Resp 16   Ht 5\' 3"  (1.6 m)   Wt 86.2 kg   SpO2 100%   BMI 33.66 kg/m   Physical Exam Constitutional:      General: She is not in acute distress.    Appearance: Normal appearance.  HENT:     Head: Normocephalic.     Nose: Nose normal.  Eyes:     Extraocular Movements: Extraocular movements intact.  Cardiovascular:     Rate and Rhythm: Normal rate.  Pulmonary:     Effort: Pulmonary effort is normal.  Musculoskeletal:     Cervical back: Normal range of motion.     Comments: On visual section right ankle appears swollen the lateral malleoli region.  Tenderness palpation the lateral malleoli region.  Neurovascular tact distally otherwise.  Decreased range of motion secondary to pain.  Compartments are otherwise soft.  Neurological:     General: No focal deficit present.     Mental Status: She is alert. Mental status is at baseline.    ED Results / Procedures / Treatments   Labs (all labs ordered are listed, but only abnormal results are displayed) Labs Reviewed - No data to display  EKG None  Radiology DG Ankle Complete Right  Result Date: 09/11/2020 CLINICAL DATA:  Ankle deformity after playing volleyball. EXAM: RIGHT ANKLE - COMPLETE 3+ VIEW COMPARISON:  None. FINDINGS: Two apparent ossific fragments inferior to the fibula and lateral to the calcaneus measure 9 mm and 3 mm, respectively, and may represent avulsion fractures. An os subfibular is also noted, measuring 8 mm. There is  no joint dislocation or joint effusion. There is soft tissue swelling around the lateral malleolus. IMPRESSION: Two apparent ossific fragments inferior to the fibula may represent avulsion fractures. Electronically Signed   By: 09/13/2020 M.D.   On: 09/11/2020 15:07    Procedures Procedures   Medications Ordered in ED Medications - No data to display  ED Course  I have reviewed the triage vital signs and the nursing notes.  Pertinent labs & imaging results that were available during my care of the patient were reviewed by  me and considered in my medical decision making (see chart for details).    MDM Rules/Calculators/A&P                           X-ray of the right ankle concerning for avulsion fractures of the right fibula.  Patient placed in an air splint.  Neurovascular tact after placement.  Advising outpatient follow-up orthopedic surgery within the week.  Advising immediate return for worsening pain fevers or additional concerns.  Patient declines any medications today such as Tylenol or Motrin which were offered.  Final Clinical Impression(s) / ED Diagnoses Final diagnoses:  Closed avulsion fracture of right ankle, initial encounter    Rx / DC Orders ED Discharge Orders     None        Cheryll Cockayne, MD 09/11/20 1553

## 2020-09-13 ENCOUNTER — Telehealth: Payer: Self-pay

## 2020-09-13 NOTE — Telephone Encounter (Signed)
Thursday ok based on xrays

## 2020-09-13 NOTE — Telephone Encounter (Signed)
Pt called in to be seen about her ankle she was seen in the hospital over the weekend and was told to follow up with Dr. August Saucer. I made her an appt for Thursday but she was wondering if she can be seen sooner.   Please advise

## 2020-09-14 ENCOUNTER — Other Ambulatory Visit: Payer: Self-pay | Admitting: Family

## 2020-09-14 DIAGNOSIS — F410 Panic disorder [episodic paroxysmal anxiety] without agoraphobia: Secondary | ICD-10-CM

## 2020-09-14 DIAGNOSIS — F418 Other specified anxiety disorders: Secondary | ICD-10-CM

## 2020-09-14 NOTE — Telephone Encounter (Signed)
Tried calling to advise per note below from Dr August Saucer. No answer. Unable to LM, VM is full.

## 2020-09-15 ENCOUNTER — Encounter: Payer: Self-pay | Admitting: Family Medicine

## 2020-09-15 ENCOUNTER — Ambulatory Visit (INDEPENDENT_AMBULATORY_CARE_PROVIDER_SITE_OTHER): Payer: 59 | Admitting: Family Medicine

## 2020-09-15 DIAGNOSIS — Z20822 Contact with and (suspected) exposure to covid-19: Secondary | ICD-10-CM | POA: Diagnosis not present

## 2020-09-15 DIAGNOSIS — J069 Acute upper respiratory infection, unspecified: Secondary | ICD-10-CM

## 2020-09-15 MED ORDER — AZITHROMYCIN 250 MG PO TABS: ORAL_TABLET | ORAL | 0 refills | Status: DC

## 2020-09-15 MED ORDER — PREDNISONE 20 MG PO TABS: 40.0000 mg | ORAL_TABLET | Freq: Every day | ORAL | 0 refills | Status: AC

## 2020-09-15 NOTE — Progress Notes (Signed)
Virtual Visit via telephone Note Due to COVID-19 pandemic this visit was conducted virtually. This visit type was conducted due to national recommendations for restrictions regarding the COVID-19 Pandemic (e.g. social distancing, sheltering in place) in an effort to limit this patient's exposure and mitigate transmission in our community. All issues noted in this document were discussed and addressed.  A physical exam was not performed with this format.   I connected with Tonya Franklin on 09/15/2020 at 1450 by telephone and verified that I am speaking with the correct person using two identifiers. Tonya Franklin is currently located at home and family is currently with them during visit. The provider, Kari Baars, FNP is located in their office at time of visit.  I discussed the limitations, risks, security and privacy concerns of performing an evaluation and management service by telephone and the availability of in person appointments. I also discussed with the patient that there may be a patient responsible charge related to this service. The patient expressed understanding and agreed to proceed.  Subjective:  Patient ID: Tonya Franklin, female    DOB: Mar 26, 1995, 25 y.o.   MRN: [id number]  Chief Complaint:  Cough   HPI: Tonya Franklin is a 25 y.o. female presenting on 09/15/2020 for Cough   Ongoing URI symptoms with cough and congestion. Has negative COVID-19 test last week and was placed on Bromfed for symptomatic relief. States symptoms are worsening not improving.   Cough The current episode started 1 to 4 weeks ago. The problem has been gradually worsening. The problem occurs constantly. The cough is Non-productive. Associated symptoms include chills, ear congestion, a fever, nasal congestion, postnasal drip and rhinorrhea. Pertinent negatives include no chest pain, ear pain, headaches, heartburn, hemoptysis, myalgias, rash, sore throat, shortness of breath, sweats, weight  loss or wheezing. Nothing aggravates the symptoms. She has tried prescription cough suppressant for the symptoms. The treatment provided no relief.    Relevant past medical, surgical, family, and social history reviewed and updated as indicated.  Allergies and medications reviewed and updated.   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   02/08/15 [facility] LGSIL/mild dysplasia, HPV testing not done- never went for colpo, wants to f/u here     Colpo:____    Mood swings 2014   Obesity (BMI 30-39.9) 06/29/2017   Obsessive-compulsive disorder 06/29/2017   Right leg swelling 03/22/2015    Past Surgical History:  Procedure Laterality Date   adnoids     CYST EXCISION     cyst removed from face    Social History   Socioeconomic History   Marital status: Single    Spouse name: Not on file   Number of children: Not on file   Years of education: Not on file   Highest education level: Not on file  Occupational History   Not on file  Tobacco Use   Smoking status: Some Days    Types: Cigarettes    Last attempt to quit: 03/19/2014    Years since quitting: 6.4   Smokeless tobacco: Never  Vaping Use   Vaping Use: Never used  Substance and Sexual Activity   Alcohol use: No    Comment: occ   Drug use: No    Comment:  occ   Sexual activity: Yes    Birth control/protection: Condom  Other Topics Concern   Not on file  Social History Narrative   Not on file   Social Determinants of Health   Financial Resource Strain: Not on file  Food Insecurity: Not on file  Transportation Needs: Not on file  Physical Activity: Not on file  Stress: Not on file  Social Connections: Not on file  Intimate Partner Violence: Not on file    Outpatient Encounter  Medications as of 09/15/2020  Medication Sig   azithromycin (ZITHROMAX Z-PAK) 250 MG tablet As directed   predniSONE (DELTASONE) 20 MG tablet Take 2 tablets (40 mg total) by mouth daily with breakfast for 5 days.   brompheniramine-pseudoephedrine-DM 30-2-10 MG/5ML syrup Take 5 mLs by mouth 4 (four) times daily as needed.   buPROPion (WELLBUTRIN XL) 150 MG 24 hr tablet Take 1 tablet (150 mg total) by mouth daily.   EPINEPHrine 0.3 mg/0.3 mL IJ SOAJ injection Inject 0.3 mLs (0.3 mg total) into the muscle as needed for anaphylaxis.   FLUoxetine (PROZAC) 20 MG tablet Take 3 tablets (60 mg total) by mouth daily.   hydrOXYzine (VISTARIL) 25 MG capsule Take 1 capsule by mouth three times daily as needed   levonorgestrel (MIRENA) 20 MCG/24HR IUD by Intrauterine route.   lisinopril (ZESTRIL) 20 MG tablet Take 1 tablet (20 mg total) by mouth daily.   methylphenidate (CONCERTA) 36 MG PO CR tablet Take 1 tablet (36 mg total) by mouth daily before breakfast.   methylphenidate (CONCERTA) 36 MG PO CR tablet Take 1 tablet (36 mg total) by mouth daily before breakfast.   methylphenidate (CONCERTA) 36 MG PO CR tablet Take 1 tablet (36 mg total) by mouth daily before breakfast.   No facility-administered encounter medications on file as of 09/15/2020.    Allergies  Allergen Reactions   Cefuroxime Axetil Other (See Comments)    Unknown    Review of Systems  Constitutional:  Positive for chills, fatigue and fever. Negative for activity change, appetite change, diaphoresis, unexpected weight change and weight loss.  HENT:  Positive for congestion, postnasal drip and rhinorrhea. Negative for ear pain, sinus pressure, sinus pain and sore throat.   Eyes: Negative.   Respiratory:  Positive for cough. Negative for apnea, hemoptysis, choking, chest tightness, shortness of breath, wheezing and stridor.   Cardiovascular:  Negative for chest pain, palpitations and leg swelling.  Gastrointestinal:  Negative for  abdominal pain, blood in stool, constipation, diarrhea, heartburn, nausea and vomiting.  Endocrine: Negative.   Genitourinary:  Negative for decreased urine volume, difficulty urinating, dysuria, frequency and urgency.  Musculoskeletal:  Negative for arthralgias and myalgias.  Skin: Negative.  Negative for rash.  Allergic/Immunologic: Negative.   Neurological:  Negative for dizziness, weakness and headaches.  Hematological: Negative.   Psychiatric/Behavioral:  Negative for confusion, hallucinations, sleep disturbance and suicidal ideas.   All other systems reviewed and are negative.       Observations/Objective: No vital signs or physical exam, this was a telephone or virtual health encounter.  Pt alert and oriented, answers all questions appropriately, and able to speak in full sentences.    Assessment and Plan: Blakely was seen today for cough.  Diagnoses and all orders for this visit:  URI with cough and congestion Ongoing symptoms for over 10 days. Negative COVID-19 test last week. Symptomatic care not beneficial. Symptoms worsening. Will treat with zithromax and prednisone. Continue symptomatic care and report any new or worsening symptoms.  -  azithromycin (ZITHROMAX Z-PAK) 250 MG tablet; As directed -     predniSONE (DELTASONE) 20 MG tablet; Take 2 tablets (40 mg total) by mouth daily with breakfast for 5 days.  Lab test negative for COVID-19 virus Negative COVID-19 last week in office.    Follow Up Instructions: Return if symptoms worsen or fail to improve.    I discussed the assessment and treatment plan with the patient. The patient was provided an opportunity to ask questions and all were answered. The patient agreed with the plan and demonstrated an understanding of the instructions.   The patient was advised to call back or seek an in-person evaluation if the symptoms worsen or if the condition fails to improve as anticipated.  The above assessment and  management plan was discussed with the patient. The patient verbalized understanding of and has agreed to the management plan. Patient is aware to call the clinic if they develop any new symptoms or if symptoms persist or worsen. Patient is aware when to return to the clinic for a follow-up visit. Patient educated on when it is appropriate to go to the emergency department.    I provided 13 minutes of non-face-to-face time during this encounter. The call started at 1450. The call ended at 1500. The other time was used for coordination of care.    Kari Baars, FNP-C [facility] [address] [place], [place] [address] [phone] 09/15/2020

## 2020-09-16 ENCOUNTER — Ambulatory Visit (INDEPENDENT_AMBULATORY_CARE_PROVIDER_SITE_OTHER): Payer: 59 | Admitting: Orthopedic Surgery

## 2020-09-16 ENCOUNTER — Other Ambulatory Visit: Payer: Self-pay

## 2020-09-16 ENCOUNTER — Encounter: Payer: Self-pay | Admitting: Orthopedic Surgery

## 2020-09-16 DIAGNOSIS — S93411A Sprain of calcaneofibular ligament of right ankle, initial encounter: Secondary | ICD-10-CM

## 2020-09-16 NOTE — Progress Notes (Signed)
Office Visit Note   Patient: Tonya Franklin           Date of Birth: Jan 15, 1996           MRN: [id number] Visit Date: 09/16/2020 Requested by: Junie Spencer, FNP [address] [place],  [place] [address] PCP: Junie Spencer, FNP  Subjective: Chief Complaint  Patient presents with   Right Ankle - Injury    DOI:09/11/20 right ankle injured playing volleyball    HPI: Tonya Franklin is a 25 year old patient with right ankle pain.  Date of injury 09/11/2020.  She injured playing volleyball.  She had prior injury to the same ankle as a child.  She works as a Child psychotherapist.  She has been nonweightbearing with crutches.  She has been in a modified ASO.  This is very uncomfortable for her.              ROS: All systems reviewed are negative as they relate to the chief complaint within the history of present illness.  Patient denies  fevers or chills.   Assessment & Plan: Visit Diagnoses:  1. Sprain of calcaneofibular ligament of right ankle, initial encounter     Plan: Impression is right ankle sprain with some evidence of chronic ossicles inferior to the fibular tip.  No lateral process talus fracture or anterior process calcaneus fracture noted.  Mortise is otherwise symmetric.  She does have bruising and swelling consistent with lateral ankle sprain.  Plan is fracture boot for 3 weeks followed by strap up ASO.  Okay to be weightbearing as tolerated in the fracture boot.  Ace wrap also applied.  Follow-up in 3 weeks for change over to ASO and initiation of ankle range of motion exercises.  Follow-Up Instructions: Return in about 3 weeks (around 10/07/2020).   Orders:  No orders of the defined types were placed in this encounter.  No orders of the defined types were placed in this encounter.     Procedures: No procedures performed   Clinical Data: No additional findings.  Objective: Vital Signs: There were no vitals taken for this visit.  Physical Exam:   Constitutional:  Patient appears well-developed HEENT:  Head: Normocephalic Eyes:EOM are normal Neck: Normal range of motion Cardiovascular: Normal rate Pulmonary/chest: Effort normal Neurologic: Patient is alert Skin: Skin is warm Psychiatric: Patient has normal mood and affect   Ortho Exam: Ortho exam demonstrates some bruising and ecchymosis around the lateral malleolar region on that right ankle.  Ankle dorsiflexion plantarflexion intact.  Patient has palpable intact nontender anterior to posterior to peroneal Achilles tendons.  No proximal fibular tenderness and no medial malleolar tenderness.  She has most exquisite tenderness right around the ATFL.  No tenderness at the base of the fifth metatarsal.  Specialty Comments:  No specialty comments available.  Imaging: No results found.   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   Past Medical History:  Diagnosis Date   Asthma    Bipolar disorder (HCC)    BV (bacterial vaginosis) 05/31/2015   Depression with anxiety 10/10/2012   Hypertension 05/28/2018   IUD (intrauterine device) in place 10/23/2016   LGSIL (low grade squamous  intraepithelial dysplasia) 06/07/2015   02/08/15 [facility] LGSIL/mild dysplasia, HPV testing not done- never went for colpo, wants to f/u here     Colpo:____    Mood swings 2014   Obesity (BMI 30-39.9) 06/29/2017   Obsessive-compulsive disorder 06/29/2017   Right leg swelling 03/22/2015    Family History  Problem Relation Age of Onset   Diabetes Father    Hyperlipidemia Father    Hypertension Father    Cancer Paternal Grandmother        lung    Cancer Other        breast   Cancer Other        ovarian and cervical    Past Surgical History:  Procedure Laterality Date   adnoids     CYST EXCISION     cyst removed from face   Social History   Occupational History   Not on file  Tobacco Use   Smoking status: Some Days    Types: Cigarettes    Last attempt to quit: 03/19/2014    Years since quitting: 6.5   Smokeless tobacco: Never  Vaping Use   Vaping Use: Never used  Substance and Sexual Activity   Alcohol use: No    Comment: occ   Drug use: No    Comment: occ   Sexual activity: Yes    Birth control/protection: Condom

## 2020-10-11 ENCOUNTER — Encounter: Payer: Self-pay | Admitting: Family

## 2020-10-11 ENCOUNTER — Ambulatory Visit (INDEPENDENT_AMBULATORY_CARE_PROVIDER_SITE_OTHER): Payer: 59 | Admitting: Family

## 2020-10-11 ENCOUNTER — Other Ambulatory Visit: Payer: Self-pay

## 2020-10-11 ENCOUNTER — Ambulatory Visit: Payer: 59 | Admitting: Orthopedic Surgery

## 2020-10-11 VITALS — BP 134/88 | HR 80 | Temp 98.1°F | Ht 63.0 in | Wt 181.6 lb

## 2020-10-11 DIAGNOSIS — M545 Low back pain, unspecified: Secondary | ICD-10-CM

## 2020-10-11 DIAGNOSIS — E669 Obesity, unspecified: Secondary | ICD-10-CM

## 2020-10-11 DIAGNOSIS — F1010 Alcohol abuse, uncomplicated: Secondary | ICD-10-CM | POA: Diagnosis not present

## 2020-10-11 DIAGNOSIS — J029 Acute pharyngitis, unspecified: Secondary | ICD-10-CM

## 2020-10-11 LAB — URINALYSIS, COMPLETE
Bilirubin, UA: NEGATIVE
Glucose, UA: NEGATIVE
Ketones, UA: NEGATIVE
Leukocytes,UA: NEGATIVE
Nitrite, UA: NEGATIVE
Protein,UA: NEGATIVE
RBC, UA: NEGATIVE
Specific Gravity, UA: >1.03 — ABNORMAL HIGH (ref 1.005–1.030)
Urobilinogen, Ur: 0.2 mg/dL (ref 0.2–1.0)
pH, UA: 6 (ref 5.0–7.5)

## 2020-10-11 LAB — MICROSCOPIC EXAMINATION
RBC, Urine: NONE SEEN /hpf (ref 0–2)
Renal Epithel, UA: NONE SEEN /hpf

## 2020-10-11 LAB — RAPID STREP SCREEN (MED CTR MEBANE ONLY): Strep Gp A Ag, IA W/Reflex: NEGATIVE

## 2020-10-11 LAB — CULTURE, GROUP A STREP

## 2020-10-11 NOTE — Patient Instructions (Signed)

## 2020-10-11 NOTE — Progress Notes (Signed)
Subjective:    Patient ID: Tonya Franklin, female    DOB: December 02, 1995, 25 y.o.   MRN: [id number]  Chief Complaint  Patient presents with   Back Pain    Wants kidney and liver checked    Sore Throat    From smoking    Pt presents to the office today with recurrent back pain. She reports her pain is worse when she drinks alcohol. She reports she is only drinking one night a week and will drink about 4 beers or 4 shots of liquor.  Back Pain This is a recurrent problem. The current episode started more than 1 month ago. The problem has been waxing and waning since onset. The pain is present in the lumbar spine. The quality of the pain is described as aching. The pain is at a severity of 7/10. The pain is moderate. Pertinent negatives include no chest pain, dysuria, fever, headaches, tingling or weakness.  Sore Throat  This is a new problem. The current episode started 1 to 4 weeks ago. The problem has been unchanged. Neither side of throat is experiencing more pain than the other. There has been no fever. The pain is at a severity of 5/10. Associated symptoms include coughing and shortness of breath (some times). Pertinent negatives include no congestion, ear pain, headaches, swollen glands or trouble swallowing. She has had no exposure to strep. She has tried acetaminophen for the symptoms. The treatment provided moderate relief.     Review of Systems  Constitutional:  Negative for fever.  HENT:  Negative for congestion, ear pain and trouble swallowing.   Respiratory:  Positive for cough and shortness of breath (some times).   Cardiovascular:  Negative for chest pain.  Genitourinary:  Negative for dysuria.  Musculoskeletal:  Positive for back pain.  Neurological:  Negative for tingling, weakness and headaches.  All other systems reviewed and are negative.     Objective:   Physical Exam Vitals reviewed.  Constitutional:      General: She is not in acute distress.    Appearance: She  is well-developed. She is obese.  HENT:     Head: Normocephalic and atraumatic.     Right Ear: External ear normal.     Mouth/Throat:     Pharynx: Posterior oropharyngeal erythema present. No oropharyngeal exudate or uvula swelling.  Eyes:     Pupils: Pupils are equal, round, and reactive to light.  Neck:     Thyroid: No thyromegaly.  Cardiovascular:     Rate and Rhythm: Normal rate and regular rhythm.     Heart sounds: Normal heart sounds. No murmur heard. Pulmonary:     Effort: Pulmonary effort is normal. No respiratory distress.     Breath sounds: Normal breath sounds. No wheezing.  Abdominal:     General: Bowel sounds are normal. There is no distension.     Palpations: Abdomen is soft.     Tenderness: There is no abdominal tenderness.  Musculoskeletal:        General: No tenderness. Normal range of motion.     Cervical back: Normal range of motion and neck supple.  Skin:    General: Skin is warm and dry.  Neurological:     Mental Status: She is alert and oriented to person, place, and time.     Cranial Nerves: No cranial nerve deficit.     Deep Tendon Reflexes: Reflexes are normal and symmetric.  Psychiatric:        Behavior: Behavior normal.  Thought Content: Thought content normal.        Judgment: Judgment normal.       BP 134/88   Pulse 80   Temp 98.1 F (36.7 C) (Temporal)   Ht _0  (1.6 m)   Wt 181 lb 9.6 oz (82.4 kg)   BMI 32.17 kg/m   Assessment & Plan:  Tonya Franklin comes in today with chief complaint of Back Pain (Wants kidney and liver checked ) and Sore Throat (From smoking )   Diagnosis and orders addressed:  1. Midline low back pain, unspecified chronicity, unspecified whether sciatica present If urine is negative, will give NSAID's and muscle relaxer ROM exercises enocuraged - Urine Culture - Urinalysis, Complete - BMP8+EGFR - Hepatic function panel  2. Sore throat - Rapid Strep Screen ([facility] ONLY) - BMP8+EGFR -  Hepatic function panel  3. Alcohol abuse - BMP8+EGFR - Hepatic function panel  4. Obesity (BMI 30-39.9) - BMP8+EGFR - Hepatic function panel   Labs pending Health Maintenance reviewed Diet and exercise encouraged  Follow up plan: As needed   Evelina Dun, FNP

## 2020-10-12 ENCOUNTER — Other Ambulatory Visit: Payer: Self-pay | Admitting: Family

## 2020-10-12 LAB — HEPATIC FUNCTION PANEL
ALT: 25 IU/L (ref 0–32)
AST: 18 IU/L (ref 0–40)
Albumin: 4.4 g/dL (ref 3.9–5.0)
Alkaline Phosphatase: 53 IU/L (ref 44–121)
Bilirubin Total: 0.4 mg/dL (ref 0.0–1.2)
Bilirubin, Direct: <0.1 mg/dL (ref 0.00–0.40)
Total Protein: 6.6 g/dL (ref 6.0–8.5)

## 2020-10-12 LAB — BMP8+EGFR
BUN/Creatinine Ratio: 13 (ref 9–23)
BUN: 8 mg/dL (ref 6–20)
CO2: 22 mmol/L (ref 20–29)
Calcium: 9.1 mg/dL (ref 8.7–10.2)
Chloride: 103 mmol/L (ref 96–106)
Creatinine, Ser: 0.62 mg/dL (ref 0.57–1.00)
Glucose: 89 mg/dL (ref 65–99)
Potassium: 4.1 mmol/L (ref 3.5–5.2)
Sodium: 138 mmol/L (ref 134–144)
eGFR: 126 mL/min/{1.73_m2} (ref >59)

## 2020-10-12 MED ORDER — BACLOFEN 10 MG PO TABS: 10.0000 mg | ORAL_TABLET | Freq: Three times a day (TID) | ORAL | 0 refills | Status: DC

## 2020-10-12 MED ORDER — DICLOFENAC SODIUM 75 MG PO TBEC: 75.0000 mg | DELAYED_RELEASE_TABLET | Freq: Two times a day (BID) | ORAL | 2 refills | Status: DC

## 2020-10-14 LAB — URINE CULTURE

## 2020-10-20 ENCOUNTER — Other Ambulatory Visit: Payer: Self-pay

## 2020-10-20 ENCOUNTER — Encounter: Payer: Self-pay | Admitting: Orthopedic Surgery

## 2020-10-20 ENCOUNTER — Ambulatory Visit (INDEPENDENT_AMBULATORY_CARE_PROVIDER_SITE_OTHER): Payer: 59 | Admitting: Orthopedic Surgery

## 2020-10-20 DIAGNOSIS — S93411A Sprain of calcaneofibular ligament of right ankle, initial encounter: Secondary | ICD-10-CM

## 2020-10-22 ENCOUNTER — Ambulatory Visit: Payer: 59 | Admitting: Surgical

## 2020-10-22 NOTE — Progress Notes (Deleted)
Office Visit Note   Patient: Tonya Franklin           Date of Birth: [birth year]           MRN: [id number] Visit Date: 10/22/2020 Requested by: Junie Spencer, FNP [address] [place],  [place] [address] PCP: Junie Spencer, FNP  Subjective: No chief complaint on file.   HPI: Tonya Franklin is a 25 y.o. female who returns to the office for follow-up visit.  Plan from last visit was to weight-bear as tolerated in fracture boot following right ankle sprain sustained on 09/11/2020.  Patient now returns with ***improvement compared with prior appointment.  Reports ***.    Additionally, ***.                ROS: All systems reviewed are negative as they relate to the chief complaint within the history of present illness.  Patient denies fevers or chills.  Assessment & Plan: Visit Diagnoses: No diagnosis found.  Plan: Tonya Franklin is a 25 y.o. female who returns to the office for follow-up visit for right ankle sprain from 09/11/2020.  Plan from last visit was full weightbearing with fracture boot.  They now return with ***  Follow-Up Instructions: No follow-ups on file.   Orders:  No orders of the defined types were placed in this encounter.  No orders of the defined types were placed in this encounter.     Procedures: No procedures performed   Clinical Data: No additional findings.  Objective: Vital Signs: There were no vitals taken for this visit.  Physical Exam:  Constitutional: Patient appears well-developed HEENT:  Head: Normocephalic Eyes:EOM are normal Neck: Normal range of motion Cardiovascular: Normal rate Pulmonary/chest: Effort normal Neurologic: Patient is alert Skin: Skin is warm Psychiatric: Patient has normal mood and affect  Ortho Exam: Ortho exam demonstrates ***  Specialty Comments:  No specialty comments available.  Imaging: No results found.   PMFS History: Patient Active Problem List   Diagnosis Date Noted    Depression, major, single episode, moderate (HCC) 06/10/2020   GAD (generalized anxiety disorder) 06/10/2020   Hypertension 05/28/2018   Obsessive-compulsive disorder 06/29/2017   Alcohol abuse 06/29/2017   Obesity (BMI 30-39.9) 06/29/2017   IUD (intrauterine device) in place 10/23/2016   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   02/08/15 [facility] LGSIL/mild dysplasia, HPV testing not done- never went for colpo, wants to f/u here     Colpo:____    Mood swings 2014   Obesity (BMI 30-39.9) 06/29/2017   Obsessive-compulsive disorder 06/29/2017   Right leg swelling 03/22/2015    Family History  Problem Relation Age of Onset   Diabetes Father    Hyperlipidemia Father    Hypertension Father    Cancer Paternal Grandmother        lung   Cancer Other        breast   Cancer Other        ovarian and cervical    Past Surgical History:  Procedure Laterality Date   adnoids     CYST EXCISION     cyst removed from face   Social History   Occupational History  Not on file  Tobacco Use   Smoking status: Some Days    Types: Cigarettes    Last attempt to quit: 03/19/2014    Years since quitting: 6.6   Smokeless tobacco: Never  Vaping Use   Vaping Use: Never used  Substance and Sexual Activity   Alcohol use: No    Comment: occ   Drug use: No    Comment: occ   Sexual activity: Yes    Birth control/protection: Condom

## 2020-10-23 NOTE — Progress Notes (Signed)
She had to leave to reschedule because she had to pick daughter up ------ cancel appointment !!!!!

## 2020-12-08 ENCOUNTER — Ambulatory Visit (INDEPENDENT_AMBULATORY_CARE_PROVIDER_SITE_OTHER): Payer: 59 | Admitting: Nurse Practitioner

## 2020-12-08 ENCOUNTER — Encounter: Payer: Self-pay | Admitting: Nurse Practitioner

## 2020-12-08 DIAGNOSIS — J069 Acute upper respiratory infection, unspecified: Secondary | ICD-10-CM | POA: Diagnosis not present

## 2020-12-08 LAB — VERITOR FLU A/B WAIVED
Influenza A: NEGATIVE
Influenza B: NEGATIVE

## 2020-12-08 NOTE — Assessment & Plan Note (Addendum)
Take meds as prescribed - Use a cool mist humidifier  -Use saline nose sprays frequently -Force fluids -For fever or aches or pains- take Tylenol or ibuprofen. -Flu/COVID-19 swab completed results pending. Follow up with worsening unresolved symptoms

## 2020-12-08 NOTE — Progress Notes (Signed)
   Virtual Visit  Note Due to COVID-19 pandemic this visit was conducted virtually. This visit type was conducted due to national recommendations for restrictions regarding the COVID-19 Pandemic (e.g. social distancing, sheltering in place) in an effort to limit this patient's exposure and mitigate transmission in our community. All issues noted in this document were discussed and addressed.  A physical exam was not performed with this format.  I connected with Tonya Franklin on 12/08/20 at 10 AM by telephone and verified that I am speaking with the correct person using two identifiers. Tonya Franklin is currently located at home during visit. The provider, Daryll Drown, NP is located in their office at time of visit.  I discussed the limitations, risks, security and privacy concerns of performing an evaluation and management service by telephone and the availability of in person appointments. I also discussed with the patient that there may be a patient responsible charge related to this service. The patient expressed understanding and agreed to proceed.   History and Present Illness:  URI  This is a new problem. Episode onset: In the past 3 days. The problem has been unchanged. There has been no fever. Associated symptoms include congestion, coughing, sinus pain and a sore throat. Pertinent negatives include no abdominal pain, ear pain or headaches. She has tried nothing for the symptoms.     Review of Systems  Constitutional:  Positive for chills and malaise/fatigue. Negative for fever.  HENT:  Positive for congestion, sinus pain and sore throat. Negative for ear pain.   Respiratory:  Positive for cough.   Gastrointestinal:  Negative for abdominal pain.  Neurological:  Negative for headaches.  Psychiatric/Behavioral: Negative.    All other systems reviewed and are negative.   Observations/Objective: Televisit patient not in distress  Assessment and Plan: Take meds as  prescribed - Use a cool mist humidifier  -Use saline nose sprays frequently -Force fluids -For fever or aches or pains- take Tylenol or ibuprofen. -Flu/COVID-19 swab completed results pending. Follow up with worsening unresolved symptoms   Follow Up Instructions: Follow-up with worsening unresolved symptoms    I discussed the assessment and treatment plan with the patient. The patient was provided an opportunity to ask questions and all were answered. The patient agreed with the plan and demonstrated an understanding of the instructions.   The patient was advised to call back or seek an in-person evaluation if the symptoms worsen or if the condition fails to improve as anticipated.  The above assessment and management plan was discussed with the patient. The patient verbalized understanding of and has agreed to the management plan. Patient is aware to call the clinic if symptoms persist or worsen. Patient is aware when to return to the clinic for a follow-up visit. Patient educated on when it is appropriate to go to the emergency department.   Time call ended: 10:08 AM  I provided 8 minutes of  non face-to-face time during this encounter.    Daryll Drown, NP

## 2020-12-08 NOTE — Patient Instructions (Signed)

## 2020-12-09 ENCOUNTER — Encounter: Payer: Self-pay | Admitting: Nurse Practitioner

## 2020-12-09 DIAGNOSIS — I10 Essential (primary) hypertension: Secondary | ICD-10-CM

## 2020-12-09 LAB — SARS-COV-2, NAA 2 DAY TAT

## 2020-12-09 LAB — NOVEL CORONAVIRUS, NAA: SARS-CoV-2, NAA: NOT DETECTED

## 2020-12-10 ENCOUNTER — Telehealth: Payer: Self-pay | Admitting: Family

## 2020-12-10 MED ORDER — FLUCONAZOLE 150 MG PO TABS: 150.0000 mg | ORAL_TABLET | Freq: Once | ORAL | 0 refills | Status: AC

## 2020-12-10 MED ORDER — AZITHROMYCIN 250 MG PO TABS: ORAL_TABLET | ORAL | 0 refills | Status: AC

## 2020-12-10 MED ORDER — LISINOPRIL 20 MG PO TABS: 20.0000 mg | ORAL_TABLET | Freq: Every day | ORAL | 0 refills | Status: DC

## 2020-12-10 NOTE — Addendum Note (Signed)
Addended by: Daryll Drown on: 12/10/2020 11:13 AM   Modules accepted: Orders

## 2020-12-10 NOTE — Telephone Encounter (Signed)
This message is already in JEs inbasket from [employer] - will address there / JE aware
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
## 2021-01-14 ENCOUNTER — Encounter: Payer: Self-pay | Admitting: Family Medicine

## 2021-01-14 ENCOUNTER — Ambulatory Visit (INDEPENDENT_AMBULATORY_CARE_PROVIDER_SITE_OTHER): Payer: 59 | Admitting: Family Medicine

## 2021-01-14 DIAGNOSIS — R509 Fever, unspecified: Secondary | ICD-10-CM | POA: Diagnosis not present

## 2021-01-14 DIAGNOSIS — J029 Acute pharyngitis, unspecified: Secondary | ICD-10-CM

## 2021-01-14 DIAGNOSIS — Z20818 Contact with and (suspected) exposure to other bacterial communicable diseases: Secondary | ICD-10-CM

## 2021-01-14 MED ORDER — AMOXICILLIN 250 MG/5ML PO SUSR: 500.0000 mg | Freq: Two times a day (BID) | ORAL | 0 refills | Status: AC

## 2021-01-14 NOTE — Progress Notes (Signed)
Virtual Visit via telephone Note Due to COVID-19 pandemic this visit was conducted virtually. This visit type was conducted due to national recommendations for restrictions regarding the COVID-19 Pandemic (e.g. social distancing, sheltering in place) in an effort to limit this patient's exposure and mitigate transmission in our community. All issues noted in this document were discussed and addressed.  A physical exam was not performed with this format.   I connected with Tonya Franklin on 01/14/2021 at 0803 by telephone and verified that I am speaking with the correct person using two identifiers. Tonya Franklin is currently located at home and family is currently with them during visit. The provider, Kari Baars, FNP is located in their office at time of visit.  I discussed the limitations, risks, security and privacy concerns of performing an evaluation and management service by telephone and the availability of in person appointments. I also discussed with the patient that there may be a patient responsible charge related to this service. The patient expressed understanding and agreed to proceed.  Subjective:  Patient ID: Tonya Franklin, female    DOB: 1995-08-13, 25 y.o.   MRN: [id number]  Chief Complaint:  Sore Throat   HPI: Tonya Franklin is a 25 y.o. female presenting on 01/14/2021 for Sore Throat   Patient reports sore throat, enlarged tonsils with erythema and white patches, fever, malaise, and positive exposure to strep throat.  States she developed symptoms yesterday.  Daughter was diagnosed with strep 2 days ago and is currently being treated.  Sore Throat  This is a new problem. The current episode started yesterday. The problem has been gradually worsening. Neither side of throat is experiencing more pain than the other. The maximum temperature recorded prior to her arrival was 101 - 101.9 F. The fever has been present for 1 to 2 days. The pain is at a severity of  6/10. The pain is moderate. Associated symptoms include swollen glands and trouble swallowing. Pertinent negatives include no abdominal pain, congestion, coughing, diarrhea, drooling, ear discharge, ear pain, headaches, hoarse voice, plugged ear sensation, neck pain, shortness of breath, stridor or vomiting. She has had no exposure to strep. She has tried acetaminophen for the symptoms. The treatment provided no relief.    Relevant past medical, surgical, family, and social history reviewed and updated as indicated.  Allergies and medications reviewed and updated.   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   02/08/15 [facility] LGSIL/mild dysplasia, HPV testing not done- never went for colpo, wants to f/u here     Colpo:____    Mood swings 2014   Obesity (BMI 30-39.9) 06/29/2017   Obsessive-compulsive disorder 06/29/2017   Right leg swelling 03/22/2015    Past Surgical History:  Procedure Laterality Date   adnoids     CYST EXCISION     cyst removed from face    Social History   Socioeconomic History   Marital status: Single    Spouse name: Not on file   Number of children: Not on file   Years of education: Not on file   Highest education level: Not on file  Occupational History   Not on file  Tobacco Use   Smoking status: Some Days    Types:  Cigarettes    Last attempt to quit: 03/19/2014    Years since quitting: 6.8   Smokeless tobacco: Never  Vaping Use   Vaping Use: Never used  Substance and Sexual Activity   Alcohol use: No    Comment: occ   Drug use: No    Comment: occ   Sexual activity: Yes    Birth control/protection: Condom  Other Topics Concern   Not on file  Social History Narrative   Not on file   Social Determinants  of Health   Financial Resource Strain: Not on file  Food Insecurity: Not on file  Transportation Needs: Not on file  Physical Activity: Not on file  Stress: Not on file  Social Connections: Not on file  Intimate Partner Violence: Not on file    Outpatient Encounter Medications as of 01/14/2021  Medication Sig   amoxicillin (AMOXIL) 250 MG/5ML suspension Take 10 mLs (500 mg total) by mouth 2 (two) times daily for 10 days.   baclofen (LIORESAL) 10 MG tablet Take 1 tablet (10 mg total) by mouth 3 (three) times daily.   diclofenac (VOLTAREN) 75 MG EC tablet Take 1 tablet (75 mg total) by mouth 2 (two) times daily.   EPINEPHrine 0.3 mg/0.3 mL IJ SOAJ injection Inject 0.3 mLs (0.3 mg total) into the muscle as needed for anaphylaxis.   FLUoxetine (PROZAC) 20 MG tablet Take 3 tablets (60 mg total) by mouth daily.   hydrOXYzine (VISTARIL) 25 MG capsule Take 1 capsule by mouth three times daily as needed   levonorgestrel (MIRENA) 20 MCG/24HR IUD by Intrauterine route.   lisinopril (ZESTRIL) 20 MG tablet Take 1 tablet (20 mg total) by mouth daily.   methylphenidate (CONCERTA) 36 MG PO CR tablet Take 1 tablet (36 mg total) by mouth daily before breakfast.   methylphenidate (CONCERTA) 36 MG PO CR tablet Take 1 tablet (36 mg total) by mouth daily before breakfast.   methylphenidate (CONCERTA) 36 MG PO CR tablet Take 1 tablet (36 mg total) by mouth daily before breakfast.   No facility-administered encounter medications on file as of 01/14/2021.    Allergies  Allergen Reactions   Cefuroxime Axetil Other (See Comments)    Unknown    Review of Systems  Constitutional:  Positive for activity change, appetite change, chills, fatigue and fever. Negative for diaphoresis and unexpected weight change.  HENT:  Positive for trouble swallowing. Negative for congestion, dental problem, drooling, ear discharge, ear pain, facial swelling, hearing loss, hoarse voice, mouth sores, nosebleeds, postnasal drip,  rhinorrhea, sinus pressure, sinus pain, sneezing, sore throat, tinnitus and voice change.   Respiratory:  Negative for cough, shortness of breath and stridor.   Cardiovascular:  Negative for chest pain, palpitations and leg swelling.  Gastrointestinal:  Negative for abdominal pain, diarrhea and vomiting.  Genitourinary:  Negative for decreased urine volume.  Musculoskeletal:  Negative for neck pain.  Neurological:  Negative for weakness and headaches.  Psychiatric/Behavioral:  Negative for confusion.   All other systems reviewed and are negative.       Observations/Objective: No vital signs or physical exam, this was a telephone or virtual health encounter.  Pt alert and oriented, answers all questions appropriately, and able to speak in full sentences.    Assessment and Plan: Alvis was seen today for sore throat.  Diagnoses and all orders for this visit:  Sore throat Fever and chills Exposure to strep throat CENTOR criteria 4.  No known exposure to strep.  Will initiate amoxicillin 500 mg  twice daily for 10 days.  Tylenol as needed for fever and pain control.  Patient aware to report any new, worsening, or persistent symptoms.  Follow-up if symptoms fail to improve or worsen. -     amoxicillin (AMOXIL) 250 MG/5ML suspension; Take 10 mLs (500 mg total) by mouth 2 (two) times daily for 10 days.    Follow Up Instructions: Return if symptoms worsen or fail to improve.    I discussed the assessment and treatment plan with the patient. The patient was provided an opportunity to ask questions and all were answered. The patient agreed with the plan and demonstrated an understanding of the instructions.   The patient was advised to call back or seek an in-person evaluation if the symptoms worsen or if the condition fails to improve as anticipated.  The above assessment and management plan was discussed with the patient. The patient verbalized understanding of and has agreed to the  management plan. Patient is aware to call the clinic if they develop any new symptoms or if symptoms persist or worsen. Patient is aware when to return to the clinic for a follow-up visit. Patient educated on when it is appropriate to go to the emergency department.    I provided 15 minutes of time during this telephone encounter.   Kari Baars, FNP-C [facility] [address] [place], [place] [address] [phone] 01/14/2021

## 2021-02-24 ENCOUNTER — Encounter: Payer: Self-pay | Admitting: Family Medicine

## 2021-02-24 ENCOUNTER — Encounter: Payer: Self-pay | Admitting: Nurse Practitioner

## 2021-02-25 ENCOUNTER — Other Ambulatory Visit: Payer: Self-pay

## 2021-02-25 ENCOUNTER — Encounter (HOSPITAL_BASED_OUTPATIENT_CLINIC_OR_DEPARTMENT_OTHER): Payer: Self-pay

## 2021-02-25 ENCOUNTER — Emergency Department (HOSPITAL_BASED_OUTPATIENT_CLINIC_OR_DEPARTMENT_OTHER)
Admission: EM | Admit: 2021-02-25 | Discharge: 2021-02-25 | Disposition: A | Discharge status: Home / Self Care | Payer: Self-pay | Attending: Emergency Medicine | Admitting: Emergency Medicine

## 2021-02-25 DIAGNOSIS — J45909 Unspecified asthma, uncomplicated: Secondary | ICD-10-CM | POA: Insufficient documentation

## 2021-02-25 DIAGNOSIS — H9203 Otalgia, bilateral: Secondary | ICD-10-CM | POA: Insufficient documentation

## 2021-02-25 DIAGNOSIS — J351 Hypertrophy of tonsils: Secondary | ICD-10-CM

## 2021-02-25 DIAGNOSIS — Z79899 Other long term (current) drug therapy: Secondary | ICD-10-CM | POA: Insufficient documentation

## 2021-02-25 DIAGNOSIS — R221 Localized swelling, mass and lump, neck: Secondary | ICD-10-CM | POA: Insufficient documentation

## 2021-02-25 DIAGNOSIS — I10 Essential (primary) hypertension: Secondary | ICD-10-CM | POA: Insufficient documentation

## 2021-02-25 LAB — GROUP A STREP BY PCR: Group A Strep by PCR: NOT DETECTED

## 2021-02-25 MED ORDER — PENICILLIN V POTASSIUM 500 MG PO TABS: 500.0000 mg | ORAL_TABLET | Freq: Two times a day (BID) | ORAL | 0 refills | Status: DC

## 2021-02-25 NOTE — ED Provider Notes (Signed)
[facility] EMERGENCY DEPT Provider Note   CSN: [id number] Arrival date & time: 02/25/21  1719     History  No chief complaint on file.   Tonya Franklin is a 26 y.o. female with a past medical history of hypertension, depression with anxiety, bipolar disorder, asthma.  Presents to the emergency department for concern of enlarged right tonsil and bilateral ear pain.  Patient states that symptoms have been present over the last week.  Patient states that she noticed her right tonsil was enlarged and became concerned.  Patient believes that the tonsil has become larger over the last week.  Patient denies any trouble swallowing, sore throat, drooling, trismus, hot potato voice, neck pain, neck stiffness, fever, chills.  Patient states that she has had bilateral otalgia over the last week as well.  Patient denies any aggravating or alleviating factors.  Patient denies any hearing loss, otorrhea, mastoid tenderness, or auricle proptosis.  HPI     Home Medications Prior to Admission medications   Medication Sig Start Date End Date Taking? Authorizing Provider  baclofen (LIORESAL) 10 MG tablet Take 1 tablet (10 mg total) by mouth 3 (three) times daily. 10/12/20   Sharion Balloon, FNP  diclofenac (VOLTAREN) 75 MG EC tablet Take 1 tablet (75 mg total) by mouth 2 (two) times daily. 10/12/20   Evelina Dun A, FNP  EPINEPHrine 0.3 mg/0.3 mL IJ SOAJ injection Inject 0.3 mLs (0.3 mg total) into the muscle as needed for anaphylaxis. 12/04/18   Robinson, [place] N, PA-C  FLUoxetine (PROZAC) 20 MG tablet Take 3 tablets (60 mg total) by mouth daily. 07/12/20   Sharion Balloon, FNP  hydrOXYzine (VISTARIL) 25 MG capsule Take 1 capsule by mouth three times daily as needed 09/15/20   Sharion Balloon, FNP  levonorgestrel (MIRENA) 20 MCG/24HR IUD by Intrauterine route.    [provider]  lisinopril (ZESTRIL) 20 MG tablet Take 1 tablet (20 mg total) by mouth daily. 12/10/20   Sharion Balloon, FNP  methylphenidate (CONCERTA) 36 MG PO CR tablet Take 1 tablet (36 mg total) by mouth daily before breakfast. 06/10/20 07/10/20  Sharion Balloon, FNP  methylphenidate (CONCERTA) 36 MG PO CR tablet Take 1 tablet (36 mg total) by mouth daily before breakfast. 07/10/20 08/09/20  Sharion Balloon, FNP  methylphenidate (CONCERTA) 36 MG PO CR tablet Take 1 tablet (36 mg total) by mouth daily before breakfast. 08/09/20 09/08/20  Evelina Dun A, FNP      Allergies    Cefuroxime axetil    Review of Systems   Review of Systems  Constitutional:  Negative for chills and fever.  HENT:  Positive for ear pain. Negative for congestion, drooling, ear discharge, facial swelling, hearing loss, rhinorrhea, sinus pressure, sinus pain, sneezing, sore throat, tinnitus and trouble swallowing.   Respiratory:  Negative for shortness of breath.   Cardiovascular:  Negative for chest pain.  Gastrointestinal:  Negative for abdominal pain, nausea and vomiting.  Musculoskeletal:  Negative for back pain, neck pain and neck stiffness.  Skin:  Negative for color change and rash.  Neurological:  Negative for dizziness, syncope, light-headedness and headaches.  Psychiatric/Behavioral:  Negative for confusion.    Physical Exam Updated Vital Signs BP (!) 139/97    Pulse 80    Temp 97.8 F (36.6 C)    Resp 16    Ht 5\' 4"  (1.626 m)    Wt 81.6 kg    SpO2 100%    BMI 30.90 kg/m  Physical Exam Vitals and nursing note reviewed.  Constitutional:      General: She is not in acute distress.    Appearance: She is not ill-appearing, toxic-appearing or diaphoretic.  HENT:     Head: Normocephalic.     Right Ear: Ear canal and external ear normal. No laceration, drainage, swelling or tenderness. No middle ear effusion. There is no impacted cerumen. No foreign body. No mastoid tenderness. Tympanic membrane is scarred. Tympanic membrane is not injected, perforated, erythematous, retracted or bulging.     Left Ear: Ear canal  and external ear normal. No laceration, drainage, swelling or tenderness.  No middle ear effusion. There is no impacted cerumen. No foreign body. No mastoid tenderness. Tympanic membrane is scarred. Tympanic membrane is not injected, perforated, erythematous, retracted or bulging.     Ears:     Comments: No auricle proptosis or mastoid tenderness bilaterally    Mouth/Throat:     Lips: Pink. No lesions.     Tongue: No lesions. Tongue does not deviate from midline.     Palate: No mass and lesions.     Pharynx: Oropharynx is clear. Uvula midline. No pharyngeal swelling, oropharyngeal exudate, posterior oropharyngeal erythema or uvula swelling.     Tonsils: No tonsillar exudate or tonsillar abscesses. 1+ on the right. 1+ on the left.     Comments: Slight enlargement to right tonsil.  No exudate or erythema.  Able to handle oral secretions without difficulty. Eyes:     General: No scleral icterus.       Right eye: No discharge.        Left eye: No discharge.  Neck:     Comments: No swelling to submandibular space Cardiovascular:     Rate and Rhythm: Normal rate.  Pulmonary:     Effort: Pulmonary effort is normal. No tachypnea, bradypnea or respiratory distress.     Breath sounds: Normal breath sounds. No stridor.  Musculoskeletal:     Cervical back: Full passive range of motion without pain, normal range of motion and neck supple. No edema, erythema, signs of trauma, rigidity, torticollis or crepitus. No pain with movement, spinous process tenderness or muscular tenderness. Normal range of motion.  Lymphadenopathy:     Cervical: No cervical adenopathy.  Skin:    General: Skin is warm and dry.  Neurological:     General: No focal deficit present.     Mental Status: She is alert.  Psychiatric:        Behavior: Behavior is cooperative.    ED Results / Procedures / Treatments   Labs (all labs ordered are listed, but only abnormal results are displayed) Labs Reviewed  GROUP A STREP BY PCR     EKG None  Radiology No results found.  Procedures Procedures    Medications Ordered in ED Medications - No data to display  ED Course/ Medical Decision Making/ A&P                           Medical Decision Making Risk Prescription drug management.   Alert 26 year old female no acute distress, nontoxic-appearing.  Presents emergency department for concern of enlarged right tonsil and bilateral ear pain.  Patient has medical history of hypertension which complicates her care.  Information was obtained from patient.  Medical records were reviewed.  Physical exam as noted above.  No findings of acute otitis media or otitis externa.  No mastoid tenderness or auricle proptosis; suspicion for mastoiditis.  Patient has no swelling to submandibular space.  Full passive range of motion to neck without pain.  Handles oral secretions without difficulty.  Low suspicion for Ludewig's angina or deep space neck infection.  Right tonsil is noted to be slightly enlarged however still +1 bilaterally.  We will swab patient for strep.  No signs of peritonsillar abscess.  Patient was given prescription for penicillin, advised to fill this prescription if she test positive for strep pharyngitis.  Patient will check her results on [facility] MyChart later this evening.  Patient given information to follow-up with her PCP and ENT.  Discussed results, findings, treatment and follow up. Patient advised of return precautions. Patient verbalized understanding and agreed with plan.         Final Clinical Impression(s) / ED Diagnoses Final diagnoses:  Otalgia of both ears  Swelling of tonsil    Rx / DC Orders ED Discharge Orders          Ordered    penicillin v potassium (VEETID) 500 MG tablet  2 times daily        02/25/21 1925              Dyann Ruddle 02/25/21 1933    Fredia Sorrow, MD 02/27/21 [phone]

## 2021-02-25 NOTE — ED Triage Notes (Signed)
Pt came in POV c/o of right sided throat swelling that started last week. Pt has had referred ear pain form the swelling. Pt denies fevers, N/V/D, exposures to Covid. Pt states she has been exposed to Strep but the swelling was noted prior to exposure.

## 2021-02-25 NOTE — Discharge Instructions (Signed)
You came to the emergency department today to be evaluated for your enlarged tonsil and ear pain.  Your physical exam was reassuring.  Your test for strep throat is currently pending at this time.  Please check your [facility] MyChart later this evening to find the results.  If positive for strep throat please pick up the prescription for penicillin.  If you have further concerns please follow-up with your primary care provider and/or the ear nose and throat doctor.  Get help right away if: You develop any new symptoms, such as vomiting, severe headache, stiff neck, chest pain, trouble breathing, or trouble swallowing. You have severe throat pain along with drooling or voice changes. You have severe pain that is not controlled with medicines. You cannot fully open your mouth. You develop redness, swelling, or severe pain anywhere in your neck.

## 2021-03-01 ENCOUNTER — Encounter: Payer: Self-pay | Admitting: Family

## 2021-03-01 ENCOUNTER — Telehealth (INDEPENDENT_AMBULATORY_CARE_PROVIDER_SITE_OTHER): Payer: Self-pay | Admitting: Family

## 2021-03-01 DIAGNOSIS — J029 Acute pharyngitis, unspecified: Secondary | ICD-10-CM

## 2021-03-01 DIAGNOSIS — H9203 Otalgia, bilateral: Secondary | ICD-10-CM

## 2021-03-01 MED ORDER — AMOXICILLIN-POT CLAVULANATE 875-125 MG PO TABS: 1.0000 | ORAL_TABLET | Freq: Two times a day (BID) | ORAL | 0 refills | Status: DC

## 2021-03-01 NOTE — Progress Notes (Signed)
Virtual Visit Consent   Caytlin Better Alcon, you are scheduled for a virtual visit with a [facility] provider today.     Just as with appointments in the office, your consent must be obtained to participate.  Your consent will be active for this visit and any virtual visit you may have with one of our providers in the next 365 days.     If you have a MyChart account, a copy of this consent can be sent to you electronically.  All virtual visits are billed to your insurance company just like a traditional visit in the office.    As this is a virtual visit, video technology does not allow for your provider to perform a traditional examination.  This may limit your provider's ability to fully assess your condition.  If your provider identifies any concerns that need to be evaluated in person or the need to arrange testing (such as labs, EKG, etc.), we will make arrangements to do so.     Although advances in technology are sophisticated, we cannot ensure that it will always work on either your end or our end.  If the connection with a video visit is poor, the visit may have to be switched to a telephone visit.  With either a video or telephone visit, we are not always able to ensure that we have a secure connection.     I need to obtain your verbal consent now.   Are you willing to proceed with your visit today?    Zailynn Brandel Borras has provided verbal consent on 03/01/2021 for a virtual visit (video or telephone).   [name], FNP   Date: 03/01/2021 2:53 PM   Virtual Visit via Video Note   I, [name], connected with  MATILDE POTTENGER  ([id number], 1995-02-23) on 03/01/21 at  2:25 PM EST by a video-enabled telemedicine application and verified that I am speaking with the correct person using two identifiers.  Location: Patient: Virtual Visit Location Patient: Work Provider: Engineer, mining Provider: Office/Clinic   I discussed the limitations of evaluation and management by  telemedicine and the availability of in person appointments. The patient expressed understanding and agreed to proceed.    History of Present Illness: Tonya CARROL is a 26 y.o. who identifies as a female who was assigned female at birth, and is being seen today for sore throat. She went to the ED on 02/25/21 and had a negative strep. She was given penicillin V potassium 500 mg BID. She reports she has been taking this since Saturday with no improvement.   HPI: Sore Throat  This is a new problem. The current episode started in the past 7 days. The problem has been unchanged. The maximum temperature recorded prior to her arrival was 101 - 101.9 F. The pain is at a severity of 7/10. Associated symptoms include ear pain, headaches, a plugged ear sensation and swollen glands. Pertinent negatives include no congestion, coughing, ear discharge or trouble swallowing. Associated symptoms comments: myalgia. She has had no exposure to strep or mono. She has tried acetaminophen and NSAIDs for the symptoms. The treatment provided mild relief.   Problems:  Patient Active Problem List   Diagnosis Date Noted   URI with cough and congestion 12/08/2020   Depression, major, single episode, moderate (HCC) 06/10/2020   GAD (generalized anxiety disorder) 06/10/2020   Hypertension 05/28/2018   Obsessive-compulsive disorder 06/29/2017   Alcohol abuse 06/29/2017   Obesity (BMI 30-39.9) 06/29/2017  IUD (intrauterine device) in place 10/23/2016   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    Allergies:  Allergies  Allergen Reactions   Cefuroxime Axetil Other (See Comments)    Unknown   Medications:  Current Outpatient Medications:    amoxicillin-clavulanate (AUGMENTIN) 875-125 MG tablet, Take 1 tablet by mouth 2 (two) times daily., Disp: 14 tablet, Rfl: 0   baclofen (LIORESAL) 10 MG tablet, Take 1 tablet (10 mg total) by mouth 3 (three) times daily., Disp: 60 each, Rfl: 0   diclofenac  (VOLTAREN) 75 MG EC tablet, Take 1 tablet (75 mg total) by mouth 2 (two) times daily., Disp: 60 tablet, Rfl: 2   EPINEPHrine 0.3 mg/0.3 mL IJ SOAJ injection, Inject 0.3 mLs (0.3 mg total) into the muscle as needed for anaphylaxis., Disp: 1 each, Rfl: 1   FLUoxetine (PROZAC) 20 MG tablet, Take 3 tablets (60 mg total) by mouth daily., Disp: 90 tablet, Rfl: 3   hydrOXYzine (VISTARIL) 25 MG capsule, Take 1 capsule by mouth three times daily as needed, Disp: 90 capsule, Rfl: 0   levonorgestrel (MIRENA) 20 MCG/24HR IUD, by Intrauterine route., Disp: , Rfl:    lisinopril (ZESTRIL) 20 MG tablet, Take 1 tablet (20 mg total) by mouth daily., Disp: 90 tablet, Rfl: 0   methylphenidate (CONCERTA) 36 MG PO CR tablet, Take 1 tablet (36 mg total) by mouth daily before breakfast., Disp: 30 tablet, Rfl: 0   methylphenidate (CONCERTA) 36 MG PO CR tablet, Take 1 tablet (36 mg total) by mouth daily before breakfast., Disp: 30 tablet, Rfl: 0   methylphenidate (CONCERTA) 36 MG PO CR tablet, Take 1 tablet (36 mg total) by mouth daily before breakfast., Disp: 30 tablet, Rfl: 0  Observations/Objective: Patient is well-developed, well-nourished in no acute distress.  At work Head is normocephalic, atraumatic.  No labored breathing.  Speech is clear and coherent with logical content.  Patient is alert and oriented at baseline.  Hoarse voice  Assessment and Plan: 1. Acute pharyngitis, unspecified etiology - amoxicillin-clavulanate (AUGMENTIN) 875-125 MG tablet; Take 1 tablet by mouth 2 (two) times daily.  Dispense: 14 tablet; Refill: 0  2. Otalgia of both ears - amoxicillin-clavulanate (AUGMENTIN) 875-125 MG tablet; Take 1 tablet by mouth 2 (two) times daily.  Dispense: 14 tablet; Refill: 0  Stop penicillin, start Augmentin.  Force fluids Tylenol as needed   Follow Up Instructions: I discussed the assessment and treatment plan with the patient. The patient was provided an opportunity to ask questions and all were  answered. The patient agreed with the plan and demonstrated an understanding of the instructions.  A copy of instructions were sent to the patient via MyChart unless otherwise noted below.    The patient was advised to call back or seek an in-person evaluation if the symptoms worsen or if the condition fails to improve as anticipated.  Time:  I spent 10 minutes with the patient via telehealth technology discussing the above problems/concerns.    [name], FNP

## 2021-03-18 ENCOUNTER — Other Ambulatory Visit: Payer: Self-pay | Admitting: Family

## 2021-03-18 DIAGNOSIS — I10 Essential (primary) hypertension: Secondary | ICD-10-CM

## 2021-03-29 ENCOUNTER — Ambulatory Visit (INDEPENDENT_AMBULATORY_CARE_PROVIDER_SITE_OTHER): Payer: Self-pay | Admitting: Family

## 2021-03-29 ENCOUNTER — Encounter: Payer: Self-pay | Admitting: Family

## 2021-03-29 VITALS — BP 133/84 | HR 98 | Temp 97.5°F | Ht 64.0 in | Wt 170.2 lb

## 2021-03-29 DIAGNOSIS — F17200 Nicotine dependence, unspecified, uncomplicated: Secondary | ICD-10-CM

## 2021-03-29 DIAGNOSIS — R059 Cough, unspecified: Secondary | ICD-10-CM

## 2021-03-29 DIAGNOSIS — F1110 Opioid abuse, uncomplicated: Secondary | ICD-10-CM

## 2021-03-29 DIAGNOSIS — J209 Acute bronchitis, unspecified: Secondary | ICD-10-CM

## 2021-03-29 MED ORDER — AZITHROMYCIN 250 MG PO TABS: ORAL_TABLET | ORAL | 0 refills | Status: DC

## 2021-03-29 MED ORDER — PREDNISONE 10 MG (21) PO TBPK: ORAL_TABLET | ORAL | 0 refills | Status: DC

## 2021-03-29 MED ORDER — ALBUTEROL SULFATE HFA 108 (90 BASE) MCG/ACT IN AERS: 2.0000 | INHALATION_SPRAY | Freq: Four times a day (QID) | RESPIRATORY_TRACT | 0 refills | Status: DC | PRN

## 2021-03-29 NOTE — Patient Instructions (Signed)
Acute Bronchitis, Adult °Acute bronchitis is sudden inflammation of the main airways (bronchi) that come off the windpipe (trachea) in the lungs. The swelling causes the airways to get smaller and make more mucus than normal. This can make it hard to breathe and can cause coughing or noisy breathing (wheezing). °Acute bronchitis may last several weeks. The cough may last longer. Allergies, asthma, and exposure to smoke may make the condition worse. °What are the causes? °This condition can be caused by germs and by substances that irritate the lungs, including: °Cold and flu viruses. The most common cause of this condition is the virus that causes the common cold. °Bacteria. This is less common. °Breathing in substances that irritate the lungs, including: °Smoke from cigarettes and other forms of tobacco. °Dust and pollen. °Fumes from household cleaning products, gases, or burned fuel. °Indoor or outdoor air pollution. °What increases the risk? °The following factors may make you more likely to develop this condition: °A weak body's defense system, also called the immune system. °A condition that affects your lungs and breathing, such as asthma. °What are the signs or symptoms? °Common symptoms of this condition include: °Coughing. This may bring up clear, yellow, or green mucus from your lungs (sputum). °Wheezing. °Runny or stuffy nose. °Having too much mucus in your lungs (chest congestion). °Shortness of breath. °Aches and pains, including sore throat or chest. °How is this diagnosed? °This condition is usually diagnosed based on: °Your symptoms and medical history. °A physical exam. °You may also have other tests, including tests to rule out other conditions, such as pneumonia. These tests include: °A test of lung function. °Test of a mucus sample to look for the presence of bacteria. °Tests to check the oxygen level in your blood. °Blood tests. °Chest X-ray. °How is this treated? °Most cases of acute bronchitis  clear up over time without treatment. Your health care provider may recommend: °Drinking more fluids to help thin your mucus so it is easier to cough up. °Taking inhaled medicine (inhaler) to improve air flow in and out of your lungs. °Using a vaporizer or a humidifier. These are machines that add water to the air to help you breathe better. °Taking a medicine that thins mucus and clears congestion (expectorant). °Taking a medicine that prevents or stops coughing (cough suppressant). °It is notcommon to take an antibiotic medicine for this condition. °Follow these instructions at home: ° °Take over-the-counter and prescription medicines only as told by your health care provider. °Use an inhaler, vaporizer, or humidifier as told by your health care provider. °Take two teaspoons (10 mL) of honey at bedtime to lessen coughing at night. °Drink enough fluid to keep your urine pale yellow. °Do not use any products that contain nicotine or tobacco. These products include cigarettes, chewing tobacco, and vaping devices, such as e-cigarettes. If you need help quitting, ask your health care provider. °Get plenty of rest. °Return to your normal activities as told by your health care provider. Ask your health care provider what activities are safe for you. °Keep all follow-up visits. This is important. °How is this prevented? °To lower your risk of getting this condition again: °Wash your hands often with soap and water for at least 20 seconds. If soap and water are not available, use hand sanitizer. °Avoid contact with people who have cold symptoms. °Try not to touch your mouth, nose, or eyes with your hands. °Avoid breathing in smoke or chemical fumes. Breathing smoke or chemical fumes will make your condition   worse. °Get the flu shot every year. °Contact a health care provider if: °Your symptoms do not improve after 2 weeks. °You have trouble coughing up the mucus. °Your cough keeps you awake at night. °You have a  fever. °Get help right away if you: °Cough up blood. °Feel pain in your chest. °Have severe shortness of breath. °Faint or keep feeling like you are going to faint. °Have a severe headache. °Have a fever or chills that get worse. °These symptoms may represent a serious problem that is an emergency. Do not wait to see if the symptoms will go away. Get medical help right away. Call your local emergency services (911 in the [place] Do not drive yourself to the hospital. °Summary °Acute bronchitis is inflammation of the main airways (bronchi) that come off the windpipe (trachea) in the lungs. The swelling causes the airways to get smaller and make more mucus than normal. °Drinking more fluids can help thin your mucus so it is easier to cough up. °Take over-the-counter and prescription medicines only as told by your health care provider. °Do not use any products that contain nicotine or tobacco. These products include cigarettes, chewing tobacco, and vaping devices, such as e-cigarettes. If you need help quitting, ask your health care provider. °Contact a health care provider if your symptoms do not improve after 2 weeks. °This information is not intended to replace advice given to you by your health care provider. Make sure you discuss any questions you have with your health care provider. °Document Revised: 05/19/2020 Document Reviewed: 05/19/2020 °Elsevier Patient Education © 2022 [employer] ° °

## 2021-03-29 NOTE — Progress Notes (Signed)
Subjective:    Patient ID: Tonya Franklin, female    DOB: [birth year], 26 y.o.   MRN: [id number]  Chief Complaint  Patient presents with   Cough    Since last Friday coughing up green stuff.    PT presents to the office today with cough that is worsening.   She reports she tore a tendon, and started taking pain pills. Since then she is addicted and can not stop. She reports she is taking oxycodone 10 mg TID.  Cough This is a new problem. The current episode started 1 to 4 weeks ago. The problem has been gradually worsening. The problem occurs every few minutes. The cough is Productive of sputum and productive of purulent sputum. Associated symptoms include chills, myalgias (from coughing), nasal congestion, postnasal drip, shortness of breath and wheezing. Pertinent negatives include no ear congestion, ear pain, fever or headaches. Risk factors for lung disease include smoking/tobacco exposure. She has tried rest and OTC cough suppressant for the symptoms. The treatment provided mild relief.     Review of Systems  Constitutional:  Positive for chills. Negative for fever.  HENT:  Positive for postnasal drip. Negative for ear pain.   Respiratory:  Positive for cough, shortness of breath and wheezing.   Musculoskeletal:  Positive for myalgias (from coughing).  Neurological:  Negative for headaches.  All other systems reviewed and are negative.     Objective:   Physical Exam Vitals reviewed.  Constitutional:      General: She is not in acute distress.    Appearance: She is well-developed.  HENT:     Head: Normocephalic and atraumatic.     Right Ear: Tympanic membrane normal.     Left Ear: Tympanic membrane normal.  Eyes:     Pupils: Pupils are equal, round, and reactive to light.  Neck:     Thyroid: No thyromegaly.  Cardiovascular:     Rate and Rhythm: Normal rate and regular rhythm.     Heart sounds: Normal heart sounds. No murmur heard. Pulmonary:     Effort: Pulmonary  effort is normal. No respiratory distress.     Breath sounds: Decreased air movement present. Wheezing present.  Abdominal:     General: Bowel sounds are normal. There is no distension.     Palpations: Abdomen is soft.     Tenderness: There is no abdominal tenderness.  Musculoskeletal:        General: No tenderness. Normal range of motion.     Cervical back: Normal range of motion and neck supple.  Skin:    General: Skin is warm and dry.  Neurological:     Mental Status: She is alert and oriented to person, place, and time.     Cranial Nerves: No cranial nerve deficit.     Deep Tendon Reflexes: Reflexes are normal and symmetric.  Psychiatric:        Behavior: Behavior normal.        Thought Content: Thought content normal.        Judgment: Judgment normal.    BP 133/84    Pulse 98    Temp (!) 97.5 F (36.4 C) (Temporal)    Ht 5\' 4"  (1.626 m)    Wt 170 lb 3.2 oz (77.2 kg)    BMI 29.21 kg/m        Assessment & Plan:  Tonya Franklin comes in today with chief complaint of Cough (Since last Friday coughing up green stuff. )   Diagnosis and  orders addressed:  1. Cough, unspecified type - predniSONE (STERAPRED UNI-PAK 21 TAB) 10 MG (21) TBPK tablet; Use as directed  Dispense: 21 tablet; Refill: 0 - azithromycin (ZITHROMAX) 250 MG tablet; Take 500 mg once, then 250 mg for four days  Dispense: 6 tablet; Refill: 0 - albuterol (VENTOLIN HFA) 108 (90 Base) MCG/ACT inhaler; Inhale 2 puffs into the lungs every 6 (six) hours as needed for wheezing or shortness of breath.  Dispense: 8 g; Refill: 0  2. Acute bronchitis, unspecified organism - Take meds as prescribed - Use a cool mist humidifier  -Use saline nose sprays frequently -Force fluids -For any cough or congestion  Use plain Mucinex- regular strength or max strength is fine -For fever or aces or pains- take tylenol or ibuprofen. -Throat lozenges if help -Smoking cessation discussed Follow up if symptoms worsen or do not  improve  - predniSONE (STERAPRED UNI-PAK 21 TAB) 10 MG (21) TBPK tablet; Use as directed  Dispense: 21 tablet; Refill: 0 - azithromycin (ZITHROMAX) 250 MG tablet; Take 500 mg once, then 250 mg for four days  Dispense: 6 tablet; Refill: 0 - albuterol (VENTOLIN HFA) 108 (90 Base) MCG/ACT inhaler; Inhale 2 puffs into the lungs every 6 (six) hours as needed for wheezing or shortness of breath.  Dispense: 8 g; Refill: 0  3. Opioid abuse (HCC) Long discussion with patient. Discussed she can decreased to oxycodone 10 mg BID for next two days, then 1 PO for 2 days, then 1/2 tab for 2 days. Discussed, if she wished she could stop cold [place], but discussed possible side effects.    4. Current smoker    [name], FNP

## 2021-04-08 ENCOUNTER — Other Ambulatory Visit: Payer: Self-pay | Admitting: Family

## 2021-04-08 DIAGNOSIS — F410 Panic disorder [episodic paroxysmal anxiety] without agoraphobia: Secondary | ICD-10-CM

## 2021-04-08 DIAGNOSIS — F418 Other specified anxiety disorders: Secondary | ICD-10-CM

## 2021-04-15 ENCOUNTER — Encounter: Payer: Self-pay | Admitting: Family

## 2021-04-15 ENCOUNTER — Ambulatory Visit (INDEPENDENT_AMBULATORY_CARE_PROVIDER_SITE_OTHER): Payer: Self-pay | Admitting: Family

## 2021-04-15 VITALS — BP 112/76 | HR 78 | Temp 98.1°F | Ht 64.0 in | Wt 169.8 lb

## 2021-04-15 DIAGNOSIS — R002 Palpitations: Secondary | ICD-10-CM

## 2021-04-15 DIAGNOSIS — Z0001 Encounter for general adult medical examination with abnormal findings: Secondary | ICD-10-CM

## 2021-04-15 DIAGNOSIS — F411 Generalized anxiety disorder: Secondary | ICD-10-CM

## 2021-04-15 DIAGNOSIS — Z0000 Encounter for general adult medical examination without abnormal findings: Secondary | ICD-10-CM

## 2021-04-15 DIAGNOSIS — F1010 Alcohol abuse, uncomplicated: Secondary | ICD-10-CM

## 2021-04-15 DIAGNOSIS — F321 Major depressive disorder, single episode, moderate: Secondary | ICD-10-CM

## 2021-04-15 DIAGNOSIS — I10 Essential (primary) hypertension: Secondary | ICD-10-CM

## 2021-04-15 DIAGNOSIS — F1110 Opioid abuse, uncomplicated: Secondary | ICD-10-CM

## 2021-04-15 DIAGNOSIS — E663 Overweight: Secondary | ICD-10-CM

## 2021-04-15 NOTE — Patient Instructions (Signed)
Palpitations °Palpitations are feelings that your heartbeat is irregular or is faster than normal. It may feel like your heart is fluttering or skipping a beat. Palpitations may be caused by many things, including smoking, caffeine, alcohol, stress, and certain medicines or drugs. Most causes of palpitations are not serious.  °However, some palpitations can be a sign of a serious problem. Further tests and a thorough medical history will be done to find the cause of your palpitations. Your provider may order tests such as an ECG, labs, an echocardiogram, or an ambulatory continuous ECG monitor. °Follow these instructions at home: °Pay attention to any changes in your symptoms. Let your health care provider know about them. Take these actions to help manage your symptoms: °Eating and drinking °Follow instructions from your health care provider about eating or drinking restrictions. You may need to avoid foods and drinks that may cause palpitations. These may include: °Caffeinated coffee, tea, soft drinks, and energy drinks. °Chocolate. °Alcohol. °Diet pills. °Lifestyle °  °Take steps to reduce your stress and anxiety. Things that can help you relax include: °Yoga. °Mind-body activities, such as deep breathing, meditation, or using words and images to create positive thoughts (guided imagery). °Physical activity, such as swimming, jogging, or walking. Tell your health care provider if your palpitations increase with activity. If you have chest pain or shortness of breath with activity, do not continue the activity until you are seen by your health care provider. °Biofeedback. This is a method that helps you learn to use your mind to control things in your body, such as your heartbeat. °Get plenty of rest and sleep. Keep a regular bed time. °Do not use drugs, including cocaine or ecstasy. Do not use marijuana. °Do not use any products that contain nicotine or tobacco. These products include cigarettes, chewing tobacco,  and vaping devices, such as e-cigarettes. If you need help quitting, ask your health care provider. °General instructions °Take over-the-counter and prescription medicines only as told by your health care provider. °Keep all follow-up visits. This is important. These may include visits for further testing if palpitations do not go away or get worse. °Contact a health care provider if: °You continue to have a fast or irregular heartbeat for a long period of time. °You notice that your palpitations occur more often. °Get help right away if: °You have chest pain or shortness of breath. °You have a severe headache. °You feel dizzy or you faint. °These symptoms may represent a serious problem that is an emergency. Do not wait to see if the symptoms will go away. Get medical help right away. Call your local emergency services (911 in the [place] Do not drive yourself to the hospital. °Summary °Palpitations are feelings that your heartbeat is irregular or is faster than normal. It may feel like your heart is fluttering or skipping a beat. °Palpitations may be caused by many things, including smoking, caffeine, alcohol, stress, certain medicines, and drugs. °Further tests and a thorough medical history may be done to find the cause of your palpitations. °Get help right away if you faint or have chest pain, shortness of breath, severe headache, or dizziness. °This information is not intended to replace advice given to you by your health care provider. Make sure you discuss any questions you have with your health care provider. °Document Revised: 06/09/2020 Document Reviewed: 06/09/2020 °Elsevier Patient Education © 2022 [employer] ° °

## 2021-04-15 NOTE — Progress Notes (Signed)
? ?Subjective:  ? ? Patient ID: Tonya Franklin, female    DOB: 1996/01/08, 25 y.o.   MRN: [id number] ? ?Chief Complaint  ?Patient presents with  ? Annual Exam  ? ?PT presents to the office today for CPE. She is followed by ENT for pharyngitis.  ? ?She reports she has decreased her alcohol. She reports once a week she may drink several shots at once. She also has struggled with opioid abuse. She reports since our last visit, she has not taken any except yesterday.  ?Nicotine Dependence ?Presents for follow-up visit. Symptoms include irritability. Her urge triggers include company of smokers. The symptoms have been stable. She smokes < 1/2 a pack of cigarettes per day.  ?Anxiety ?Presents for follow-up visit. Symptoms include depressed mood, excessive worry, irritability, nervous/anxious behavior and palpitations. Symptoms occur occasionally. The severity of symptoms is moderate. The quality of sleep is good.  ? ? ?Depression ?       This is a chronic problem.  The current episode started more than 1 year ago.   The onset quality is gradual.   The problem occurs intermittently.  Associated symptoms include helplessness, hopelessness, irritable and sad.  Past treatments include SSRIs - Selective serotonin reuptake inhibitors.  Past medical history includes anxiety.   ?Palpitations  ?This is a new problem. The current episode started more than 1 month ago. The problem occurs intermittently. Associated symptoms include anxiety.  ? ? ? ?Review of Systems  ?Constitutional:  Positive for irritability.  ?Cardiovascular:  Positive for palpitations.  ?Psychiatric/Behavioral:  Positive for depression. The patient is nervous/anxious.   ?All other systems reviewed and are negative. ? ?Family History  ?Problem Relation Age of Onset  ? Diabetes Father   ? Hyperlipidemia Father   ? Hypertension Father   ? Cancer Paternal Grandmother   ?     lung  ? Cancer Other   ?     breast  ? Cancer Other   ?     ovarian and cervical  ? ?Social  History  ? ?Socioeconomic History  ? Marital status: Single  ?  Spouse name: Not on file  ? Number of children: Not on file  ? Years of education: Not on file  ? Highest education level: Not on file  ?Occupational History  ? Not on file  ?Tobacco Use  ? Smoking status: Some Days  ?  Types: Cigarettes  ?  Last attempt to quit: 03/19/2014  ?  Years since quitting: 7.0  ? Smokeless tobacco: Never  ?Vaping Use  ? Vaping Use: Never used  ?Substance and Sexual Activity  ? Alcohol use: No  ?  Comment: occ  ? Drug use: No  ?  Comment: occ  ? Sexual activity: Yes  ?  Birth control/protection: Condom  ?Other Topics Concern  ? Not on file  ?Social History Narrative  ? Not on file  ? ?Social Determinants of Health  ? ?Financial Resource Strain: Not on file  ?Food Insecurity: Not on file  ?Transportation Needs: Not on file  ?Physical Activity: Not on file  ?Stress: Not on file  ?Social Connections: Not on file  ? ? ?   ?Objective:  ? Physical Exam ?Vitals reviewed.  ?Constitutional:   ?   General: She is irritable. She is not in acute distress. ?   Appearance: She is well-developed.  ?HENT:  ?   Head: Normocephalic and atraumatic.  ?   Right Ear: Tympanic membrane normal.  ?  Left Ear: Tympanic membrane normal.  ?Eyes:  ?   Pupils: Pupils are equal, round, and reactive to light.  ?Neck:  ?   Thyroid: No thyromegaly.  ?Cardiovascular:  ?   Rate and Rhythm: Normal rate and regular rhythm.  ?   Heart sounds: Normal heart sounds. No murmur heard. ?Pulmonary:  ?   Effort: Pulmonary effort is normal. No respiratory distress.  ?   Breath sounds: Normal breath sounds. No wheezing.  ?Abdominal:  ?   General: Bowel sounds are normal. There is no distension.  ?   Palpations: Abdomen is soft.  ?   Tenderness: There is no abdominal tenderness.  ?Musculoskeletal:     ?   General: No tenderness. Normal range of motion.  ?   Cervical back: Normal range of motion and neck supple.  ?Skin: ?   General: Skin is warm and dry.  ?Neurological:  ?    Mental Status: She is alert and oriented to person, place, and time.  ?   Cranial Nerves: No cranial nerve deficit.  ?   Deep Tendon Reflexes: Reflexes are normal and symmetric.  ?Psychiatric:     ?   Behavior: Behavior normal.     ?   Thought Content: Thought content normal.     ?   Judgment: Judgment normal.  ? ? ? ? ?BP 112/76   Pulse 78   Temp 98.1 ?F (36.7 ?C) (Temporal)   Ht 5' 4"  (1.626 m)   Wt 169 lb 12.8 oz (77 kg)   SpO2 98%   BMI 29.15 kg/m?  ? ?   ?Assessment & Plan:  ?Tonya Franklin comes in today with chief complaint of Annual Exam ? ? ?Diagnosis and orders addressed: ? ?1. Primary hypertension ? ?- CMP14+EGFR ?- CBC with Differential/Platelet ? ?2. GAD (generalized anxiety disorder) ?- CMP14+EGFR ?- CBC with Differential/Platelet ? ?3. Depression, major, single episode, moderate ([place]) ?- CMP14+EGFR ?- CBC with Differential/Platelet ? ?4. Annual physical exam ? ?- CMP14+EGFR ?- CBC with Differential/Platelet ?- Lipid panel ?- TSH ?- Vitamin B12 ?- EKG 12-Lead ? ?5. Alcohol abuse ?- CMP14+EGFR ?- CBC with Differential/Platelet ? ?6. Overweight (BMI 25.0-29.9) ?- CMP14+EGFR ?- CBC with Differential/Platelet ? ?7. Opioid abuse ([place]) ?- CMP14+EGFR ?- CBC with Differential/Platelet ? ?8. Palpitations ?Limit caffeine  ?Stress management  ?- CMP14+EGFR ?- CBC with Differential/Platelet ?- EKG 12-Lead ? ? ?Labs pending ?Health Maintenance reviewed ?Diet and exercise encouraged ? ?Follow up plan: ?6 months  ? ?Evelina Dun, FNP ? ? ? ?

## 2021-04-16 LAB — CBC WITH DIFFERENTIAL/PLATELET
Basophils Absolute: 0 10*3/uL (ref 0.0–0.2)
Basos: 0 %
EOS (ABSOLUTE): 0.1 10*3/uL (ref 0.0–0.4)
Eos: 1 %
Hematocrit: 41.6 % (ref 34.0–46.6)
Hemoglobin: 13.9 g/dL (ref 11.1–15.9)
Immature Grans (Abs): 0 10*3/uL (ref 0.0–0.1)
Immature Granulocytes: 0 %
Lymphocytes Absolute: 1.8 10*3/uL (ref 0.7–3.1)
Lymphs: 31 %
MCH: 30.5 pg (ref 26.6–33.0)
MCHC: 33.4 g/dL (ref 31.5–35.7)
MCV: 91 fL (ref 79–97)
Monocytes Absolute: 0.3 10*3/uL (ref 0.1–0.9)
Monocytes: 6 %
Neutrophils Absolute: 3.6 10*3/uL (ref 1.4–7.0)
Neutrophils: 62 %
Platelets: 196 10*3/uL (ref 150–450)
RBC: 4.56 x10E6/uL (ref 3.77–5.28)
RDW: 12.2 % (ref 11.7–15.4)
WBC: 5.8 10*3/uL (ref 3.4–10.8)

## 2021-04-16 LAB — CMP14+EGFR
ALT: 18 IU/L (ref 0–32)
AST: 15 IU/L (ref 0–40)
Albumin/Globulin Ratio: 2.6 — ABNORMAL HIGH (ref 1.2–2.2)
Albumin: 4.4 g/dL (ref 3.9–5.0)
Alkaline Phosphatase: 50 IU/L (ref 44–121)
BUN/Creatinine Ratio: 11 (ref 9–23)
BUN: 10 mg/dL (ref 6–20)
Bilirubin Total: 0.4 mg/dL (ref 0.0–1.2)
CO2: 23 mmol/L (ref 20–29)
Calcium: 9.2 mg/dL (ref 8.7–10.2)
Chloride: 106 mmol/L (ref 96–106)
Creatinine, Ser: 0.87 mg/dL (ref 0.57–1.00)
Globulin, Total: 1.7 g/dL (ref 1.5–4.5)
Glucose: 97 mg/dL (ref 70–99)
Potassium: 4.6 mmol/L (ref 3.5–5.2)
Sodium: 141 mmol/L (ref 134–144)
Total Protein: 6.1 g/dL (ref 6.0–8.5)
eGFR: 94 mL/min/{1.73_m2} (ref >59)

## 2021-04-16 LAB — VITAMIN B12: Vitamin B-12: 230 pg/mL — ABNORMAL LOW (ref 232–1245)

## 2021-04-16 LAB — LIPID PANEL
Chol/HDL Ratio: 4.2 ratio (ref 0.0–4.4)
Cholesterol, Total: 146 mg/dL (ref 100–199)
HDL: 35 mg/dL — ABNORMAL LOW (ref >39)
LDL Chol Calc (NIH): 92 mg/dL (ref 0–99)
Triglycerides: 104 mg/dL (ref 0–149)
VLDL Cholesterol Cal: 19 mg/dL (ref 5–40)

## 2021-04-16 LAB — TSH: TSH: 1.25 u[IU]/mL (ref 0.450–4.500)

## 2021-04-18 ENCOUNTER — Telehealth: Payer: Self-pay | Admitting: Family

## 2021-04-18 NOTE — Telephone Encounter (Signed)
Patient called and she is aware provider has not looked at lobs she is concerned that her Albumin/Globulin Ratio is off and flagged patient want this addressed and if we would add on A1c. Patient aware will call back one provider reviews. ?

## 2021-04-19 MED ORDER — CYANOCOBALAMIN 1000 MCG/ML IJ SOLN: INTRAMUSCULAR | 1 refills | Status: DC

## 2021-04-19 NOTE — Addendum Note (Signed)
Addended by: [name] A on: 04/19/2021 09:27 AM ? ? Modules accepted: Orders ? ?

## 2021-04-19 NOTE — Telephone Encounter (Signed)
See Result note 

## 2021-04-21 ENCOUNTER — Other Ambulatory Visit: Payer: Self-pay | Admitting: Family

## 2021-04-21 ENCOUNTER — Ambulatory Visit (INDEPENDENT_AMBULATORY_CARE_PROVIDER_SITE_OTHER): Payer: Self-pay | Admitting: Family

## 2021-04-21 ENCOUNTER — Encounter: Payer: Self-pay | Admitting: Family

## 2021-04-21 DIAGNOSIS — F418 Other specified anxiety disorders: Secondary | ICD-10-CM

## 2021-04-21 DIAGNOSIS — F410 Panic disorder [episodic paroxysmal anxiety] without agoraphobia: Secondary | ICD-10-CM

## 2021-04-21 DIAGNOSIS — R059 Cough, unspecified: Secondary | ICD-10-CM

## 2021-04-21 DIAGNOSIS — J209 Acute bronchitis, unspecified: Secondary | ICD-10-CM

## 2021-04-21 DIAGNOSIS — M542 Cervicalgia: Secondary | ICD-10-CM

## 2021-04-21 MED ORDER — HYDROXYZINE PAMOATE 25 MG PO CAPS: ORAL_CAPSULE | ORAL | 1 refills | Status: DC

## 2021-04-21 MED ORDER — DICLOFENAC SODIUM 75 MG PO TBEC: 75.0000 mg | DELAYED_RELEASE_TABLET | Freq: Two times a day (BID) | ORAL | 0 refills | Status: DC

## 2021-04-21 NOTE — Patient Instructions (Signed)

## 2021-04-21 NOTE — Progress Notes (Signed)
? ?Virtual Visit  Note ?Due to COVID-19 pandemic this visit was conducted virtually. This visit type was conducted due to national recommendations for restrictions regarding the COVID-19 Pandemic (e.g. social distancing, sheltering in place) in an effort to limit this patient's exposure and mitigate transmission in our community. All issues noted in this document were discussed and addressed.  A physical exam was not performed with this format. ? ?I connected with Tonya Franklin on 04/21/21 at 12:00 pm  by telephone and verified that I am speaking with the correct person using two identifiers. Tonya Franklin is currently located at home and no one is currently with her during visit. The provider, [name], FNP is located in their office at time of visit. ? ?I discussed the limitations, risks, security and privacy concerns of performing an evaluation and management service by telephone and the availability of in person appointments. I also discussed with the patient that there may be a patient responsible charge related to this service. The patient expressed understanding and agreed to proceed. ? ? ?Ms. Barg,you are scheduled for a virtual visit with your provider today.   ? ?Just as we do with appointments in the office, we must obtain your consent to participate.  Your consent will be active for this visit and any virtual visit you may have with one of our providers in the next 365 days.   ? ?If you have a MyChart account, I can also send a copy of this consent to you electronically.  All virtual visits are billed to your insurance company just like a traditional visit in the office.  As this is a virtual visit, video technology does not allow for your provider to perform a traditional examination.  This may limit your provider's ability to fully assess your condition.  If your provider identifies any concerns that need to be evaluated in person or the need to arrange testing such as labs, EKG, etc,  we will make arrangements to do so.   ? ?Although advances in technology are sophisticated, we cannot ensure that it will always work on either your end or our end.  If the connection with a video visit is poor, we may have to switch to a telephone visit.  With either a video or telephone visit, we are not always able to ensure that we have a secure connection.   I need to obtain your verbal consent now.   Are you willing to proceed with your visit today?  ? ?Tonya Franklin has provided verbal consent on 04/21/2021 for a virtual visit (video or telephone). ? ? ?[name], FNP ?04/21/2021  12:28 PM ? ? ? ?History and Present Illness: ? ?Neck Pain  ?This is a new problem. The current episode started 1 to 4 weeks ago. The problem occurs intermittently. The problem has been waxing and waning. The pain is associated with nothing. The pain is present in the right side. The quality of the pain is described as stabbing. The pain is at a severity of 6/10. The pain is mild. Nothing aggravates the symptoms. Pertinent negatives include no fever, headaches, numbness, pain with swallowing, paresis, photophobia, syncope, tingling, trouble swallowing, visual change or weakness. She has tried nothing for the symptoms. The treatment provided no relief.  ?Anxiety ?Presents for follow-up visit. Symptoms include depressed mood, excessive worry, irritability, nervous/anxious behavior and restlessness. Symptoms occur occasionally. The severity of symptoms is moderate.  ? ? ? ? ? ?Review of Systems  ?Constitutional:  Positive for irritability. Negative for fever.  ?HENT:  Negative for trouble swallowing.   ?Eyes:  Negative for photophobia.  ?Cardiovascular:  Negative for syncope.  ?Musculoskeletal:  Positive for neck pain.  ?Neurological:  Negative for tingling, weakness, numbness and headaches.  ?Psychiatric/Behavioral:  The patient is nervous/anxious.   ? ? ?Observations/Objective: ?No SOB or distress noted  ? ?Assessment and  Plan: ?1. Neck pain ?Rest ?ROM exercises  ?Take diclofenac BID with food, avoid other NSAIDs ?- diclofenac (VOLTAREN) 75 MG EC tablet; Take 1 tablet (75 mg total) by mouth 2 (two) times daily.  Dispense: 30 tablet; Refill: 0 ? ?2. Depression with anxiety ?Continue Vistaril, but increase to BID or TID prn  ?Stress management  ?- hydrOXYzine (VISTARIL) 25 MG capsule; TAKE 1 CAPSULE BY MOUTH 3 TIMES DAILY AS NEEDED.  Dispense: 90 capsule; Refill: 1 ? ?3. Panic attack ? ?- hydrOXYzine (VISTARIL) 25 MG capsule; TAKE 1 CAPSULE BY MOUTH 3 TIMES DAILY AS NEEDED.  Dispense: 90 capsule; Refill: 1 ? ? ?  ?I discussed the assessment and treatment plan with the patient. The patient was provided an opportunity to ask questions and all were answered. The patient agreed with the plan and demonstrated an understanding of the instructions. ?  ?The patient was advised to call back or seek an in-person evaluation if the symptoms worsen or if the condition fails to improve as anticipated. ? ?The above assessment and management plan was discussed with the patient. The patient verbalized understanding of and has agreed to the management plan. Patient is aware to call the clinic if symptoms persist or worsen. Patient is aware when to return to the clinic for a follow-up visit. Patient educated on when it is appropriate to go to the emergency department.  ? ?Time call ended: 12:12 pm   ? ?I provided 12 minutes of  non face-to-face time during this encounter. ? ? ? ?[name], FNP ? ? ?

## 2021-06-13 ENCOUNTER — Other Ambulatory Visit: Payer: Self-pay | Admitting: Family

## 2021-06-13 DIAGNOSIS — I10 Essential (primary) hypertension: Secondary | ICD-10-CM

## 2021-06-30 HISTORY — PX: ANTERIOR CRUCIATE LIGAMENT REPAIR: SHX115

## 2021-07-29 ENCOUNTER — Ambulatory Visit: Payer: Managed Care, Other (non HMO) | Attending: Specialist | Admitting: Physical Therapy

## 2021-07-29 ENCOUNTER — Other Ambulatory Visit: Payer: Self-pay

## 2021-07-29 ENCOUNTER — Encounter: Payer: Self-pay | Admitting: Physical Therapy

## 2021-07-29 DIAGNOSIS — M25661 Stiffness of right knee, not elsewhere classified: Secondary | ICD-10-CM | POA: Insufficient documentation

## 2021-07-29 DIAGNOSIS — R600 Localized edema: Secondary | ICD-10-CM | POA: Insufficient documentation

## 2021-07-29 DIAGNOSIS — M25562 Pain in left knee: Secondary | ICD-10-CM | POA: Insufficient documentation

## 2021-07-29 DIAGNOSIS — M6281 Muscle weakness (generalized): Secondary | ICD-10-CM | POA: Diagnosis present

## 2021-07-29 NOTE — Therapy (Addendum)
OUTPATIENT PHYSICAL THERAPY LOWER EXTREMITY EVALUATION   Patient Name: Tonya Franklin MRN: [id number] DOB:1995-09-28, 26 y.o., female Today's Date: 07/29/2021   PT End of Session - 07/29/21 0956     Visit Number 1    Number of Visits 12    Date for PT Re-Evaluation 08/26/21    PT Start Time 0815    PT Stop Time 0846    PT Time Calculation (min) 31 min    Activity Tolerance Patient tolerated treatment well    Behavior During Therapy [facility] for tasks assessed/performed             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   02/08/15 [facility] [place] LGSIL/mild dysplasia, HPV testing not done- never went for colpo, wants to f/u here     Colpo:____    Mood swings 2014   Obesity (BMI 30-39.9) 06/29/2017   Obsessive-compulsive disorder 06/29/2017   Right leg swelling 03/22/2015   Past Surgical History:  Procedure Laterality Date   adnoids     CYST EXCISION     cyst removed from face   Patient Active Problem List   Diagnosis Date Noted   Opioid abuse (HCC) 03/29/2021   Depression, major, single episode, moderate (HCC) 06/10/2020   GAD (generalized anxiety disorder) 06/10/2020   Hypertension 05/28/2018   Obsessive-compulsive disorder 06/29/2017   Alcohol abuse 06/29/2017   Overweight (BMI 25.0-29.9) 06/29/2017   IUD (intrauterine device) in place 10/23/2016   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      REFERRING PROVIDER: Eugenia Mcalpine MD  REFERRING DIAG: Left ACL reconstruction.  THERAPY DIAG:  Acute pain of left knee  Localized edema  Stiffness of right knee, not elsewhere classified  Muscle weakness (generalized)  Rationale for Evaluation and Treatment Rehabilitation  ONSET DATE: DOI:  06/22/21.   07/26/21 (surgery date).  SUBJECTIVE:   SUBJECTIVE STATEMENT: The patient presents to the clinic today s/p left ACL reconstruction performed on 07/26/21.  She is walking with axillary crutches and has a knee immobilizer donned as well as TED hose.  She has an HEP.  We discussed the importance of compliance to it.  Her pain is a 6/10 at rest today but rises to much higher levels with moving her knee.  She hasn't found anything really decreases her pain thus far.  PERTINENT HISTORY: OCD.  PAIN:  Are you having pain? Yes: NPRS scale: 6/10 Pain location: Left knee. Pain description: sore, throbbing and sharp. Aggravating factors: Movement of left knee. Relieving factors: Nothing.  PRECAUTIONS: Other: Progress per ACL reconstruction protocol.  WEIGHT BEARING RESTRICTIONS Yes She presented to the clinic NWBing over her left LE today.  FALLS:  Has patient fallen in last 6 months? Yes. Number of falls 1 at date of injury during a volleyball game on 06/22/21.  PLOF: Independent  PATIENT GOALS Get back to pre-injury status.   OBJECTIVE:   COGNITION:  Overall cognitive status: Within functional limits for tasks assessed     ROM:  In supine left knee lacks 18 degrees of extension and flexion is to 65 degrees.  Strength:  Patient currently unable to perform an antigravity left SLR and has a significant loss of volitional left quadriceps activation currently.  GAIT:  Assistive device utilized: Crutches  Level of assistance: Complete Independence Comments: NWBing over left LE with left knee immobilizer donned.    TODAY'S TREATMENT: Evaluation and detailed discussion to be compliant to her HEP and work hard on achieving full left knee extension.   HOME EXERCISE PROGRAM: Supine knee extension stretch.  ASSESSMENT:  CLINICAL IMPRESSION: The patient presents to OPPT s/p left knee ACL reconstruction performed on 07/16/21.  She is wearing TED hose and a left knee immobilizer.  She is  NWBing over her left LE with bilateral axillary crutches.  She is able to remove bulky post-surgical dressing today.  This was done with sterile gauze pads placed over incisional sites.  She has a f/u MD visit today.  She currently lacks both left knee flexion and extension.  She demonstrated minimal left quadriceps activation today and cannot perform an antigravity SLR.  Patient will benefit from skilled physical therapy intervention to address pain and deficits. OBJECTIVE IMPAIRMENTS Abnormal gait, decreased activity tolerance, difficulty walking, decreased ROM, decreased strength, and increased edema.   ACTIVITY LIMITATIONS locomotion level  PARTICIPATION LIMITATIONS: cleaning, laundry, occupation, and yard work  [facility] POTENTIAL: Excellent  CLINICAL DECISION MAKING: Stable/uncomplicated  EVALUATION COMPLEXITY: Low   GOALS: Goals reviewed with patient? Yes  SHORT TERM GOALS: Target date: 08/12/2021  Independent with an initial HEP. Baseline: Goal status: INITIAL  2.  Full left knee extension in supine. Baseline:  Goal status: INITIAL   LONG TERM GOALS: Target date: 08/26/2021   Independent with an advanced HEP. Baseline:  Goal status: INITIAL  2.  Active left knee flexion to 125 degrees+ so the patient can perform functional tasks and do so with pain not > 2-3/10. Baseline:  Goal status: INITIAL  3.  Increase left hip and knee strength to a 5/5 to provide good stability for accomplishment of functional activities. Baseline:  Goal status: INITIAL  4.  Perform a reciprocating stair gait with one railing with pain not > 2/10. Baseline:  Goal status: INITIAL  5.  Patient walk a community distance without assistive device. Baseline:  Goal status: INITIAL  6.  Return to PLOF. Baseline:  Goal status: INITIAL   PLAN: PT FREQUENCY:  2-3 times a week for 4 weeks.  PT DURATION: 4 weeks  PLANNED INTERVENTIONS: Therapeutic exercises, Therapeutic activity, Neuromuscular  re-education, Balance training, Gait training, Patient/Family education, Stair training, Electrical stimulation, Cryotherapy, and Ultrasound  PLAN FOR NEXT SESSION: FOTO.  Work on achieving full left knee extension, VMS to left quadriceps during quad sets, supine wall slides.   Allea Kassner, [place], PT 07/29/2021, 1:58 PM

## 2021-07-30 ENCOUNTER — Encounter: Payer: Self-pay | Admitting: Physical Therapy

## 2021-08-01 ENCOUNTER — Encounter: Payer: Managed Care, Other (non HMO) | Admitting: *Deleted

## 2021-08-04 ENCOUNTER — Encounter: Payer: Self-pay | Admitting: Physical Therapy

## 2021-08-04 ENCOUNTER — Ambulatory Visit: Payer: Managed Care, Other (non HMO) | Attending: Specialist | Admitting: Physical Therapy

## 2021-08-04 DIAGNOSIS — M25661 Stiffness of right knee, not elsewhere classified: Secondary | ICD-10-CM | POA: Diagnosis present

## 2021-08-04 DIAGNOSIS — M25562 Pain in left knee: Secondary | ICD-10-CM | POA: Diagnosis not present

## 2021-08-04 DIAGNOSIS — R600 Localized edema: Secondary | ICD-10-CM | POA: Diagnosis present

## 2021-08-04 DIAGNOSIS — M6281 Muscle weakness (generalized): Secondary | ICD-10-CM | POA: Diagnosis present

## 2021-08-04 NOTE — Therapy (Signed)
OUTPATIENT PHYSICAL THERAPY LOWER EXTREMITY EVALUATION   Patient Name: Tonya Franklin MRN: [id number] DOB:05/27/1995, 26 y.o., female Today's Date: 08/04/2021   PT End of Session - 08/04/21 1541     Visit Number 2    Number of Visits 12    Date for PT Re-Evaluation 08/26/21    PT Start Time 0230    PT Stop Time 0316    PT Time Calculation (min) 46 min    Activity Tolerance Patient tolerated treatment well    Behavior During Therapy [facility] for tasks assessed/performed             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   02/08/15 [facility] [place] LGSIL/mild dysplasia, HPV testing not done- never went for colpo, wants to f/u here     Colpo:____    Mood swings 2014   Obesity (BMI 30-39.9) 06/29/2017   Obsessive-compulsive disorder 06/29/2017   Right leg swelling 03/22/2015   Past Surgical History:  Procedure Laterality Date   adnoids     CYST EXCISION     cyst removed from face   Patient Active Problem List   Diagnosis Date Noted   Opioid abuse (HCC) 03/29/2021   Depression, major, single episode, moderate (HCC) 06/10/2020   GAD (generalized anxiety disorder) 06/10/2020   Hypertension 05/28/2018   Obsessive-compulsive disorder 06/29/2017   Alcohol abuse 06/29/2017   Overweight (BMI 25.0-29.9) 06/29/2017   IUD (intrauterine device) in place 10/23/2016   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      REFERRING PROVIDER: Eugenia Mcalpine MD  REFERRING DIAG: Left ACL reconstruction.  THERAPY DIAG:  Acute pain of left knee  Localized edema  Stiffness of right knee, not elsewhere classified  Muscle weakness (generalized)  Rationale for Evaluation and Treatment Rehabilitation  ONSET DATE: DOI:  06/22/21.   07/26/21 (surgery date).  SUBJECTIVE:   SUBJECTIVE STATEMENT: Patient presents to the clinic today with one axillary crutch wbat over her left LE with a knee brace locked in full extension to be used while walking. PERTINENT HISTORY: OCD.  PAIN:  Are you having pain? Yes: NPRS scale: 5/10 Pain location: Left knee. Pain description: sore, throbbing and sharp. Aggravating factors: Movement of left knee. Relieving factors: Nothing.     OBJECTIVE:    TODAY'S TREATMENT: Nustep at level moving seat forward x 2 to increase flexion.  4 electrode [place] electrical stimulation x 15 minutes with patient performing QS with 10 sec holds and 10 sec rest.    HOME EXERCISE PROGRAM: Prone hang.     EXERCISE PROGRAM Created by [place] Manasvini Whatley Jul 6th, 2023 View at [email or web address] using code: XUFKRGN Total 1 Page 1 of 1 PRONE KNEE HANGS While lying down on your stomach, allow your leg to hang off the end of a table/bed. Position yourself so that your knee cap is just over the end of the table/bed. Just relax your body and allow gravity to stretch your knee into a more straightened position. Add an ankle weight for increased stretch. Repeat 1 Time Hold 10 Minutes Complete 1 Set Perform 3 Times a Day  ASSESSMENT:  CLINICAL IMPRESSION: Patient did an excellent job today.  In supine she was -5 degrees of left knee extension and full with gentle passive overpressure stretch  and flexion improved to 83 degrees.   GOALS: Goals reviewed with patient? Yes  SHORT TERM GOALS: Target date: 08/18/2021  Independent with an initial HEP. Baseline: Goal status: INITIAL  2.  Full left knee extension in supine. Baseline:  Goal status: INITIAL   LONG TERM GOALS: Target date: 09/01/2021   Independent with an advanced HEP. Baseline:  Goal status: INITIAL  2.  Active left knee flexion to 125 degrees+ so the patient can perform functional tasks and do so with pain not > 2-3/10. Baseline:   Goal status: INITIAL  3.  Increase left hip and knee strength to a 5/5 to provide good stability for accomplishment of functional activities. Baseline:  Goal status: INITIAL  4.  Perform a reciprocating stair gait with one railing with pain not > 2/10. Baseline:  Goal status: INITIAL  5.  Patient walk a community distance without assistive device. Baseline:  Goal status: INITIAL  6.  Return to PLOF. Baseline:  Goal status: INITIAL   PLAN: PT FREQUENCY:  2-3 times a week for 4 weeks.  PT DURATION: 4 weeks  PLANNED INTERVENTIONS: Therapeutic exercises, Therapeutic activity, Neuromuscular re-education, Balance training, Gait training, Patient/Family education, Stair training, Electrical stimulation, Cryotherapy, and Ultrasound  PLAN FOR NEXT SESSION: FOTO.  Work on achieving full left knee extension, VMS to left quadriceps during quad sets, supine wall slides.   Lonnell Chaput, [place], PT 08/04/2021, 3:43 PM

## 2021-08-05 ENCOUNTER — Encounter: Payer: Managed Care, Other (non HMO) | Admitting: Physical Therapy

## 2021-08-09 ENCOUNTER — Ambulatory Visit: Payer: Managed Care, Other (non HMO) | Admitting: *Deleted

## 2021-08-09 ENCOUNTER — Encounter: Payer: Self-pay | Admitting: *Deleted

## 2021-08-09 DIAGNOSIS — M25562 Pain in left knee: Secondary | ICD-10-CM

## 2021-08-09 DIAGNOSIS — R600 Localized edema: Secondary | ICD-10-CM

## 2021-08-09 NOTE — Therapy (Signed)
OUTPATIENT PHYSICAL THERAPY LOWER EXTREMITY TREATMENT   Patient Name: Tonya Franklin MRN: [id number] DOB:[birth year], 26 y.o., female Today's Date: 08/09/2021   PT End of Session - 08/09/21 1407     Visit Number 3    Number of Visits 12    Date for PT Re-Evaluation 08/26/21    PT Start Time 1346    PT Stop Time 1439    PT Time Calculation (min) 53 min             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   02/08/15 [facility] [place] LGSIL/mild dysplasia, HPV testing not done- never went for colpo, wants to f/u here     Colpo:____    Mood swings 2014   Obesity (BMI 30-39.9) 06/29/2017   Obsessive-compulsive disorder 06/29/2017   Right leg swelling 03/22/2015   Past Surgical History:  Procedure Laterality Date   adnoids     CYST EXCISION     cyst removed from face   Patient Active Problem List   Diagnosis Date Noted   Opioid abuse (HCC) 03/29/2021   Depression, major, single episode, moderate (HCC) 06/10/2020   GAD (generalized anxiety disorder) 06/10/2020   Hypertension 05/28/2018   Obsessive-compulsive disorder 06/29/2017   Alcohol abuse 06/29/2017   Overweight (BMI 25.0-29.9) 06/29/2017   IUD (intrauterine device) in place 10/23/2016   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      REFERRING PROVIDER: Eugenia Mcalpine MD  REFERRING DIAG: Left ACL reconstruction.  THERAPY DIAG:  Acute pain of left knee  Localized edema  Rationale for Evaluation and Treatment Rehabilitation  ONSET DATE: DOI:  06/22/21.  07/26/21 (surgery date).  SUBJECTIVE:   SUBJECTIVE STATEMENT: Patient reports performing HEP. Still unable to perform QS. PERTINENT HISTORY: OCD.  PAIN:  Are you having pain? Yes: NPRS scale:  4-5/10 Pain location: Left knee. Pain description: sore, throbbing and sharp. Aggravating factors: Movement of left knee. Relieving factors: Nothing.     OBJECTIVE:    TODAY'S TREATMENT: Nustep at level 1 x 20 mins for ROM progression. Moving seat forward x 2 to increase flexion.   4 electrode [place] electrical stimulation x 15 minutes with patient performing QS with 10 sec holds and 10 sec rest with heel prop.  PROM for flexion and extension ROM as well as STW to quads, ITB and HS as well as patella mobs   HOME EXERCISE PROGRAM: Prone hang.   Added seated HS with OP from other LE for flexion progression 08-09-21  EXERCISE PROGRAM Created by [place] Applegate Jul 6th, 2023 View at [email or web address] using code: XUFKRGN Total 1 Page 1 of 1 PRONE KNEE HANGS While lying down on your stomach, allow your leg to hang off the end of a table/bed. Position yourself so that your knee cap is just over the end of the table/bed. Just relax your body and allow gravity to stretch your knee into a more straightened position. Add an ankle weight for increased stretch. Repeat 1 Time Hold 10 Minutes Complete 1 Set Perform 3 Times a Day  ASSESSMENT:  CLINICAL IMPRESSION: Patient did an excellent job today.  In supine she was 0 degrees of left knee extension end of session  and  flexion improved to 85 degrees PROM. Pt  still  with Quad lag and poor quad activation. Improved QS with [place] facilitaion. Pt encouraged to practice QS many x times per day as well as flexion stretching.   GOALS: Goals reviewed with patient? Yes  SHORT TERM GOALS: Target date: 08/23/2021  Independent with an initial HEP. Baseline: Goal status: INITIAL  2.  Full left knee extension in supine. Baseline:  Goal status: INITIAL   LONG TERM GOALS: Target date: 09/06/2021   Independent with an advanced HEP. Baseline:  Goal status: INITIAL  2.  Active left knee flexion to 125 degrees+ so the patient can  perform functional tasks and do so with pain not > 2-3/10. Baseline:  Goal status: INITIAL  3.  Increase left hip and knee strength to a 5/5 to provide good stability for accomplishment of functional activities. Baseline:  Goal status: INITIAL  4.  Perform a reciprocating stair gait with one railing with pain not > 2/10. Baseline:  Goal status: INITIAL  5.  Patient walk a community distance without assistive device. Baseline:  Goal status: INITIAL  6.  Return to PLOF. Baseline:  Goal status: INITIAL   PLAN: PT FREQUENCY:  2-3 times a week for 4 weeks.  PT DURATION: 4 weeks  PLANNED INTERVENTIONS: Therapeutic exercises, Therapeutic activity, Neuromuscular re-education, Balance training, Gait training, Patient/Family education, Stair training, Electrical stimulation, Cryotherapy, and Ultrasound  PLAN FOR NEXT SESSION: FOTO.  Work on achieving full left knee extension, VMS to left quadriceps during quad sets, supine wall slides.   Kalea Perine,CHRIS, PTA 08/09/2021, 4:27 PM

## 2021-08-11 ENCOUNTER — Encounter: Payer: Self-pay | Admitting: *Deleted

## 2021-08-11 ENCOUNTER — Ambulatory Visit: Payer: Managed Care, Other (non HMO) | Admitting: *Deleted

## 2021-08-11 DIAGNOSIS — M6281 Muscle weakness (generalized): Secondary | ICD-10-CM

## 2021-08-11 DIAGNOSIS — M25661 Stiffness of right knee, not elsewhere classified: Secondary | ICD-10-CM

## 2021-08-11 DIAGNOSIS — M25562 Pain in left knee: Secondary | ICD-10-CM

## 2021-08-11 DIAGNOSIS — R600 Localized edema: Secondary | ICD-10-CM

## 2021-08-11 NOTE — Therapy (Signed)
OUTPATIENT PHYSICAL THERAPY LOWER EXTREMITY TREATMENT   Patient Name: Tonya Franklin MRN: [id number] DOB:August 05, 1995, 26 y.o., female Today's Date: 08/11/2021   PT End of Session - 08/11/21 1616     Visit Number 4    Number of Visits 12    Date for PT Re-Evaluation 08/26/21    PT Start Time 1522    PT Stop Time 1613    PT Time Calculation (min) 51 min             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   02/08/15 [facility] [place] LGSIL/mild dysplasia, HPV testing not done- never went for colpo, wants to f/u here     Colpo:____    Mood swings 2014   Obesity (BMI 30-39.9) 06/29/2017   Obsessive-compulsive disorder 06/29/2017   Right leg swelling 03/22/2015   Past Surgical History:  Procedure Laterality Date   adnoids     CYST EXCISION     cyst removed from face   Patient Active Problem List   Diagnosis Date Noted   Opioid abuse (HCC) 03/29/2021   Depression, major, single episode, moderate (HCC) 06/10/2020   GAD (generalized anxiety disorder) 06/10/2020   Hypertension 05/28/2018   Obsessive-compulsive disorder 06/29/2017   Alcohol abuse 06/29/2017   Overweight (BMI 25.0-29.9) 06/29/2017   IUD (intrauterine device) in place 10/23/2016   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      REFERRING PROVIDER: Eugenia Mcalpine MD  REFERRING DIAG: Left ACL reconstruction.  THERAPY DIAG:  Acute pain of left knee  Localized edema  Stiffness of right knee, not elsewhere classified  Muscle weakness (generalized)  Rationale for Evaluation and Treatment Rehabilitation  ONSET DATE: DOI:  06/22/21.  07/26/21 (surgery date).  SUBJECTIVE:   SUBJECTIVE STATEMENT: Patient reports performing HEP. Still unable to perform  QS very well PERTINENT HISTORY: OCD.  PAIN:  Are you having pain? Yes: NPRS scale: 3-5/10 Pain location: Left knee. Pain description: sore, throbbing and sharp. Aggravating factors: Movement of left knee. Relieving factors: Nothing.     OBJECTIVE:    TODAY'S TREATMENT:     08-11-21 Nustep at level 1 x 10 mins for ROM progression, Bike x10 mins partial revs for ROM. to increase flexion.  [id number] with manual assist   4 electrode [place] electrical stimulation x 15 minutes with patient performing QS with 10 sec holds and 10 sec rest with heel prop.  Manual:  PROM for flexion and extension ROM as well as STW to quads, ITB and HS as well as patella mobs   HOME EXERCISE PROGRAM: Prone hang.   Added seated HS with OP from other LE for flexion progression 08-09-21  EXERCISE PROGRAM Created by [place] Applegate Jul 6th, 2023 View at [email or web address] using code: XUFKRGN Total 1 Page 1 of 1 PRONE KNEE HANGS While lying down on your stomach, allow your leg to hang off the end of a table/bed. Position yourself so that your knee cap is just over the end of the table/bed. Just relax your body and allow gravity to stretch your knee into a more straightened position. Add an ankle weight for increased stretch. Repeat 1 Time Hold 10 Minutes Complete 1 Set Perform 3 Times a Day  ASSESSMENT:  CLINICAL IMPRESSION: Patient did an excellent  job today.  In supine she was 0 degrees of left knee extension end of session  and  flexion improved to 90 degrees PROM. Pt  still with Quad lag and poor quad activation. Improved QS with [place] facilitaion and [address].  Pt encouraged to practice QS many x times per day as well as flexion stretching.   GOALS: Goals reviewed with patient? Yes  SHORT TERM GOALS: Target date: 08/25/2021  Independent with an initial HEP. Baseline: Goal status: INITIAL  2.  Full left knee extension in supine. Baseline:  Goal status: INITIAL   LONG TERM GOALS:  Target date: 09/08/2021   Independent with an advanced HEP. Baseline:  Goal status: INITIAL  2.  Active left knee flexion to 125 degrees+ so the patient can perform functional tasks and do so with pain not > 2-3/10. Baseline:  Goal status: INITIAL  3.  Increase left hip and knee strength to a 5/5 to provide good stability for accomplishment of functional activities. Baseline:  Goal status: INITIAL  4.  Perform a reciprocating stair gait with one railing with pain not > 2/10. Baseline:  Goal status: INITIAL  5.  Patient walk a community distance without assistive device. Baseline:  Goal status: INITIAL  6.  Return to PLOF. Baseline:  Goal status: INITIAL   PLAN: PT FREQUENCY:  2-3 times a week for 4 weeks.  PT DURATION: 4 weeks  PLANNED INTERVENTIONS: Therapeutic exercises, Therapeutic activity, Neuromuscular re-education, Balance training, Gait training, Patient/Family education, Stair training, Electrical stimulation, Cryotherapy, and Ultrasound  PLAN FOR NEXT SESSION:  Work on achieving full left knee extension, VMS to left quadriceps during quad sets, supine wall slides.   Bradin Mcadory,CHRIS, PTA 08/11/2021, 4:34 PM

## 2021-08-15 ENCOUNTER — Ambulatory Visit: Payer: Managed Care, Other (non HMO) | Admitting: Physical Therapy

## 2021-08-17 ENCOUNTER — Encounter: Payer: Managed Care, Other (non HMO) | Admitting: Physical Therapy

## 2021-08-18 ENCOUNTER — Ambulatory Visit: Payer: Managed Care, Other (non HMO) | Admitting: *Deleted

## 2021-08-18 ENCOUNTER — Encounter: Payer: Self-pay | Admitting: *Deleted

## 2021-08-18 DIAGNOSIS — M25562 Pain in left knee: Secondary | ICD-10-CM | POA: Diagnosis not present

## 2021-08-18 DIAGNOSIS — R600 Localized edema: Secondary | ICD-10-CM

## 2021-08-18 NOTE — Therapy (Signed)
OUTPATIENT PHYSICAL THERAPY LOWER EXTREMITY TREATMENT   Patient Name: Tonya Franklin MRN: [id number] DOB:10-15-1995, 26 y.o., female Today's Date: 08/18/2021   PT End of Session - 08/18/21 1446     Visit Number 5    Number of Visits 12    Date for PT Re-Evaluation 08/26/21    PT Start Time 1435    PT Stop Time 1530    PT Time Calculation (min) 55 min             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   02/08/15 [facility] [place] LGSIL/mild dysplasia, HPV testing not done- never went for colpo, wants to f/u here     Colpo:____    Mood swings 2014   Obesity (BMI 30-39.9) 06/29/2017   Obsessive-compulsive disorder 06/29/2017   Right leg swelling 03/22/2015   Past Surgical History:  Procedure Laterality Date   adnoids     CYST EXCISION     cyst removed from face   Patient Active Problem List   Diagnosis Date Noted   Opioid abuse (HCC) 03/29/2021   Depression, major, single episode, moderate (HCC) 06/10/2020   GAD (generalized anxiety disorder) 06/10/2020   Hypertension 05/28/2018   Obsessive-compulsive disorder 06/29/2017   Alcohol abuse 06/29/2017   Overweight (BMI 25.0-29.9) 06/29/2017   IUD (intrauterine device) in place 10/23/2016   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      REFERRING PROVIDER: Eugenia Mcalpine MD  REFERRING DIAG: Left ACL reconstruction.  THERAPY DIAG:  Acute pain of left knee  Localized edema  Rationale for Evaluation and Treatment Rehabilitation  ONSET DATE: DOI:  06/22/21.  07/26/21 (surgery date).  SUBJECTIVE:   SUBJECTIVE STATEMENT: Patient reports performing HEP. Still unable to perform QS very well still. Working on bending more PERTINENT HISTORY: OCD.  PAIN:  Are  you having pain? Yes: NPRS scale: 3-5/10 Pain location: Left knee. Pain description: sore, throbbing and sharp. Aggravating factors: Movement of left knee. Relieving factors: Nothing.     OBJECTIVE:    TODAY'S TREATMENT:     08-18-21 Nustep at level 1 x 10 mins for ROM progression, Bike x10 mins  mainly partial revs , x 2 full retro turns to increase flexion ROM SAQ'sx15 with manual assist for full extension SLR with manual assist to prevent lag x 10   4 electrode [place] electrical stimulation x 15 minutes with patient performing QS with 10 sec holds and 10 sec rest with heel prop.  Manual:  PROM for flexion and extension ROM as well as STW to quads, ITB and HS as well as patella mobs   HOME EXERCISE PROGRAM: Prone hang.   Added seated HS with OP from other LE for flexion progression 08-09-21  EXERCISE PROGRAM Created by [place] Applegate Jul 6th, 2023 View at [email or web address] using code: [email or web address] Total 1 Page 1 of 1 PRONE KNEE HANGS While lying down on your stomach, allow your leg to hang off the end of a table/bed. Position yourself so that your knee cap is just over the end of the table/bed. Just relax your body and allow gravity to stretch your knee into a more straightened position. Add an ankle weight for increased stretch. Repeat 1 Time Hold 10 Minutes Complete 1 Set Perform 3 Times  a Day  ASSESSMENT:  CLINICAL IMPRESSION: Pt arrived today with brace donned LT knee. Rx again focused on progression of ROM for flexion and extension. Pt did better today and was able to tolerate increased resistance for flexion ROM and able to perform 2 full revs backwards on bike. Pt encouraged to work on ROM harder at home. [place] Estim still needed for quad facilitation.  GOALS: Goals reviewed with patient? Yes  SHORT TERM GOALS: Target date: 09/01/2021  Independent with an initial HEP. Baseline: Goal status: INITIAL  2.  Full left knee extension in supine. Baseline:  Goal  status: INITIAL   LONG TERM GOALS: Target date: 09/15/2021   Independent with an advanced HEP. Baseline:  Goal status: INITIAL  2.  Active left knee flexion to 125 degrees+ so the patient can perform functional tasks and do so with pain not > 2-3/10. Baseline:  Goal status: INITIAL  3.  Increase left hip and knee strength to a 5/5 to provide good stability for accomplishment of functional activities. Baseline:  Goal status: INITIAL  4.  Perform a reciprocating stair gait with one railing with pain not > 2/10. Baseline:  Goal status: INITIAL  5.  Patient walk a community distance without assistive device. Baseline:  Goal status: INITIAL  6.  Return to PLOF. Baseline:  Goal status: INITIAL   PLAN: PT FREQUENCY:  2-3 times a week for 4 weeks.  PT DURATION: 4 weeks  PLANNED INTERVENTIONS: Therapeutic exercises, Therapeutic activity, Neuromuscular re-education, Balance training, Gait training, Patient/Family education, Stair training, Electrical stimulation, Cryotherapy, and Ultrasound  PLAN FOR NEXT SESSION:  Work on achieving full left knee extension, VMS to left quadriceps during quad sets, supine wall slides.   Kailena Lubas,CHRIS, PTA 08/18/2021, 4:30 PM

## 2021-08-23 ENCOUNTER — Encounter: Payer: Self-pay | Admitting: Physical Therapy

## 2021-08-23 ENCOUNTER — Ambulatory Visit: Payer: Managed Care, Other (non HMO) | Admitting: Physical Therapy

## 2021-08-23 DIAGNOSIS — M25562 Pain in left knee: Secondary | ICD-10-CM | POA: Diagnosis not present

## 2021-08-23 DIAGNOSIS — M6281 Muscle weakness (generalized): Secondary | ICD-10-CM

## 2021-08-23 DIAGNOSIS — R600 Localized edema: Secondary | ICD-10-CM

## 2021-08-23 DIAGNOSIS — M25661 Stiffness of right knee, not elsewhere classified: Secondary | ICD-10-CM

## 2021-08-23 NOTE — Therapy (Signed)
OUTPATIENT PHYSICAL THERAPY LOWER EXTREMITY TREATMENT   Patient Name: Tonya Franklin MRN: [id number] DOB:04/10/1995, 26 y.o., female Today's Date: 08/23/2021   PT End of Session - 08/23/21 1725     Visit Number 6    Number of Visits 12    Date for PT Re-Evaluation 08/26/21    PT Start Time 0320    PT Stop Time 0411    PT Time Calculation (min) 51 min    Activity Tolerance Patient tolerated treatment well    Behavior During Therapy [facility] for tasks assessed/performed             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   02/08/15 [facility] [place] LGSIL/mild dysplasia, HPV testing not done- never went for colpo, wants to f/u here     Colpo:____    Mood swings 2014   Obesity (BMI 30-39.9) 06/29/2017   Obsessive-compulsive disorder 06/29/2017   Right leg swelling 03/22/2015   Past Surgical History:  Procedure Laterality Date   adnoids     CYST EXCISION     cyst removed from face   Patient Active Problem List   Diagnosis Date Noted   Opioid abuse (HCC) 03/29/2021   Depression, major, single episode, moderate (HCC) 06/10/2020   GAD (generalized anxiety disorder) 06/10/2020   Hypertension 05/28/2018   Obsessive-compulsive disorder 06/29/2017   Alcohol abuse 06/29/2017   Overweight (BMI 25.0-29.9) 06/29/2017   IUD (intrauterine device) in place 10/23/2016   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      REFERRING PROVIDER: Eugenia Mcalpine MD  REFERRING DIAG: Left ACL reconstruction.  THERAPY DIAG:  Acute pain of left knee  Localized edema  Stiffness of right knee, not elsewhere classified  Muscle weakness (generalized)  Rationale for Evaluation and Treatment Rehabilitation  ONSET DATE: DOI:  06/22/21.   07/26/21 (surgery date).  SUBJECTIVE:   SUBJECTIVE STATEMENT:       No new complaints. PERTINENT HISTORY: OCD.  PAIN:  Are you having pain? Yes: NPRS scale: 3-5/10 Pain location: Left knee. Pain description: sore, throbbing and sharp. Aggravating factors: Movement of left knee. Relieving factors: Nothing.     OBJECTIVE:    TODAY'S TREATMENT:     08-23-21 Nustep at level 1 x 5 mins for ROM progression, Bike x15 mins  starting at seat 9 and progressing to seat 8. QS's x20 minutes facilitated with [place] electrical stimulation with 4 electrodes for neuro re-education.  4 electrode [place] electrical stimulation x 15 minutes with patient performing QS with 10 sec holds and 10 sec rest with heel prop.     ASSESSMENT:  CLINICAL IMPRESSION: Very good job today with patient able to perform recumbent bike with greater ease than last time and made multiple forward revolutions.  Gentle passive left knee flexion to 97 degrees. GOALS: Goals reviewed with patient? Yes  SHORT TERM GOALS: Target date: 09/06/2021  Independent with an initial HEP. Baseline: Goal status: INITIAL  2.  Full left knee extension in supine. Baseline:  Goal status: INITIAL   LONG TERM GOALS: Target date: 09/20/2021   Independent with an advanced HEP. Baseline:  Goal status: INITIAL  2.  Active left knee flexion to 125 degrees+ so the patient can perform functional tasks and do so with pain not >  2-3/10. Baseline:  Goal status: INITIAL  3.  Increase left hip and knee strength to a 5/5 to provide good stability for accomplishment of functional activities. Baseline:  Goal status: INITIAL  4.  Perform a reciprocating stair gait with one railing with pain not > 2/10. Baseline:  Goal status: INITIAL  5.  Patient walk a community distance without assistive device. Baseline:  Goal status: INITIAL  6.  Return to PLOF. Baseline:  Goal status: INITIAL   PLAN: PT FREQUENCY:  2-3 times a week for 4  weeks.  PT DURATION: 4 weeks  PLANNED INTERVENTIONS: Therapeutic exercises, Therapeutic activity, Neuromuscular re-education, Balance training, Gait training, Patient/Family education, Stair training, Electrical stimulation, Cryotherapy, and Ultrasound  PLAN FOR NEXT SESSION:  Recumbent bike progression.    Ege Muckey, [place], PT 08/23/2021, 5:28 PM

## 2021-08-25 ENCOUNTER — Encounter: Payer: Managed Care, Other (non HMO) | Admitting: *Deleted

## 2021-08-26 ENCOUNTER — Encounter: Payer: Self-pay | Admitting: *Deleted

## 2021-08-26 ENCOUNTER — Ambulatory Visit: Payer: Managed Care, Other (non HMO) | Admitting: *Deleted

## 2021-08-26 DIAGNOSIS — R600 Localized edema: Secondary | ICD-10-CM

## 2021-08-26 DIAGNOSIS — M25562 Pain in left knee: Secondary | ICD-10-CM

## 2021-08-26 DIAGNOSIS — M25661 Stiffness of right knee, not elsewhere classified: Secondary | ICD-10-CM

## 2021-08-26 DIAGNOSIS — M6281 Muscle weakness (generalized): Secondary | ICD-10-CM

## 2021-08-26 NOTE — Therapy (Signed)
OUTPATIENT PHYSICAL THERAPY LOWER EXTREMITY TREATMENT   Patient Name: Tonya Franklin MRN: [id number] DOB:1995/06/03, 26 y.o., female Today's Date: 08/26/2021   PT End of Session - 08/26/21 0835     Visit Number 7    Number of Visits 12    Date for PT Re-Evaluation 08/26/21    PT Start Time 0820    PT Stop Time 0915    PT Time Calculation (min) 55 min             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   02/08/15 [facility] [place] LGSIL/mild dysplasia, HPV testing not done- never went for colpo, wants to f/u here     Colpo:____    Mood swings 2014   Obesity (BMI 30-39.9) 06/29/2017   Obsessive-compulsive disorder 06/29/2017   Right leg swelling 03/22/2015   Past Surgical History:  Procedure Laterality Date   adnoids     CYST EXCISION     cyst removed from face   Patient Active Problem List   Diagnosis Date Noted   Opioid abuse (HCC) 03/29/2021   Depression, major, single episode, moderate (HCC) 06/10/2020   GAD (generalized anxiety disorder) 06/10/2020   Hypertension 05/28/2018   Obsessive-compulsive disorder 06/29/2017   Alcohol abuse 06/29/2017   Overweight (BMI 25.0-29.9) 06/29/2017   IUD (intrauterine device) in place 10/23/2016   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      REFERRING PROVIDER: Eugenia Mcalpine MD  REFERRING DIAG: Left ACL reconstruction.  THERAPY DIAG:  Acute pain of left knee  Localized edema  Stiffness of right knee, not elsewhere classified  Muscle weakness (generalized)  Rationale for Evaluation and Treatment Rehabilitation  ONSET DATE: DOI:  06/22/21.  07/26/21 (surgery date).  SUBJECTIVE:   SUBJECTIVE STATEMENT:       Knee pain is at the bottom part of knee  cap     PERTINENT HISTORY: OCD.  PAIN:  Are you having pain? Yes: NPRS scale: 3-5/10 Pain location: Left knee. Pain description: sore, throbbing and sharp. Aggravating factors: Movement of left knee. Relieving factors: Nothing.     OBJECTIVE:    TODAY'S TREATMENT:     08-23-21 Nustep at level 1 x 5 mins for ROM progression, Bike x15 mins  starting at seat 9 and progressing to seat 7. QS's x5 minutes facilitated with [place] electrical stimulation with 4 electrodes for neuro re-education x 15 mins SAQ's  4 electrode [place] electrical stimulation x 10 sec holds and 10 sec rest.        Manual PROM for Extension with quad sets and SLR assistance due to extensor lag. Patella mobs,scar mobs    ASSESSMENT:  CLINICAL IMPRESSION:                            Patient arrived today doing about the same with pain levels and wearing brace.             Rx focused onProgression of ROM and quad activation using [place] Estim. Still with Extensor lag      GOALS:  Goals reviewed with patient? Yes  SHORT TERM GOALS: Target date: 09/09/2021  Independent with an initial HEP. Baseline: Goal status: INITIAL  2.  Full left  knee extension in supine. Baseline:  Goal status: INITIAL   LONG TERM GOALS: Target date: 09/23/2021   Independent with an advanced HEP. Baseline:  Goal status: INITIAL  2.  Active left knee flexion to 125 degrees+ so the patient can perform functional tasks and do so with pain not > 2-3/10. Baseline:  Goal status: INITIAL  3.  Increase left hip and knee strength to a 5/5 to provide good stability for accomplishment of functional activities. Baseline:  Goal status: INITIAL  4.  Perform a reciprocating stair gait with one railing with pain not > 2/10. Baseline:  Goal status: INITIAL  5.  Patient walk a community distance without assistive device. Baseline:  Goal status: INITIAL  6.  Return to PLOF. Baseline:  Goal status:  INITIAL   PLAN: PT FREQUENCY:  2-3 times a week for 4 weeks.  PT DURATION: 4 weeks  PLANNED INTERVENTIONS: Therapeutic exercises, Therapeutic activity, Neuromuscular re-education, Balance training, Gait training, Patient/Family education, Stair training, Electrical stimulation, Cryotherapy, and Ultrasound  PLAN FOR NEXT SESSION:  Recumbent bike progression.    Xavyer Steenson,CHRIS, PTA 08/26/2021, 10:48 AM

## 2021-08-31 ENCOUNTER — Ambulatory Visit: Payer: Managed Care, Other (non HMO) | Attending: Specialist | Admitting: Physical Therapy

## 2021-08-31 ENCOUNTER — Encounter: Payer: Self-pay | Admitting: Physical Therapy

## 2021-08-31 DIAGNOSIS — R600 Localized edema: Secondary | ICD-10-CM | POA: Diagnosis present

## 2021-08-31 DIAGNOSIS — M25662 Stiffness of left knee, not elsewhere classified: Secondary | ICD-10-CM | POA: Diagnosis present

## 2021-08-31 DIAGNOSIS — M6281 Muscle weakness (generalized): Secondary | ICD-10-CM | POA: Diagnosis present

## 2021-08-31 DIAGNOSIS — M25562 Pain in left knee: Secondary | ICD-10-CM | POA: Diagnosis present

## 2021-08-31 NOTE — Therapy (Addendum)
OUTPATIENT PHYSICAL THERAPY LOWER EXTREMITY TREATMENT   Patient Name: Tonya Franklin MRN: [id number] DOB:1995-11-20, 26 y.o., female Today's Date: 08/31/2021   PT End of Session - 08/31/21 1212     Visit Number 8    Number of Visits 12    Date for PT Re-Evaluation 08/26/21    PT Start Time 1125   Running late.   PT Stop Time 1211    PT Time Calculation (min) 46 min    Activity Tolerance Patient limited by pain    Behavior During Therapy [facility] for tasks assessed/performed              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   02/08/15 [facility] [place] LGSIL/mild dysplasia, HPV testing not done- never went for colpo, wants to f/u here     Colpo:____    Mood swings 2014   Obesity (BMI 30-39.9) 06/29/2017   Obsessive-compulsive disorder 06/29/2017   Right leg swelling 03/22/2015   Past Surgical History:  Procedure Laterality Date   adnoids     CYST EXCISION     cyst removed from face   Patient Active Problem List   Diagnosis Date Noted   Opioid abuse (HCC) 03/29/2021   Depression, major, single episode, moderate (HCC) 06/10/2020   GAD (generalized anxiety disorder) 06/10/2020   Hypertension 05/28/2018   Obsessive-compulsive disorder 06/29/2017   Alcohol abuse 06/29/2017   Overweight (BMI 25.0-29.9) 06/29/2017   IUD (intrauterine device) in place 10/23/2016   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      REFERRING PROVIDER: Eugenia Mcalpine MD  REFERRING DIAG: Left ACL reconstruction.  THERAPY DIAG:  Acute pain of left knee  Localized edema  Muscle weakness (generalized)  Stiffness of left knee, not elsewhere classified  Rationale for Evaluation and Treatment Rehabilitation  ONSET DATE: DOI:  06/22/21.   07/26/21 (surgery date).  SUBJECTIVE:   SUBJECTIVE STATEMENT:       Doing okay.  Pain on inside part of knee today.     PERTINENT HISTORY: OCD.  PAIN:  Are you having pain? Yes: NPRS scale: 3-5/10 Pain location: Left knee. Pain description: sore, throbbing and sharp. Aggravating factors: Movement of left knee. Relieving factors: Nothing.     OBJECTIVE:    TODAY'S TREATMENT:     08/31/21  Recumbent bike x 17 mins  starting at seat 9 and progressing to seat 6. In supine passive range of motion into flexion and extension and in the prone position sustained low load long duration stretching (total 10 minutes).  "Zero Knee" with 5# overpressure stretch x 11 minutes. HEP:  PROGHROAMME EXERCISE PROGRAM Created by [place] Heydi Swango Aug 2nd, 2023 View at [email or web address] using code: [email or web address] Total 1 Page 1 of 1 PRONE QUAD STRETCH WITH BELT OR STRAP Start by lying on your stomach with a strap or 2 belts linked together and looped it around your affected side ankle. Next, use the belt to pull the knee into a bent position allowing for a stretch as shown. Repeat 10 Times Hold 30 Seconds Complete 1 Set Perform 4 Times a Day  ASSESSMENT:  CLINICAL IMPRESSION:  Patient arrived today doing about the same with pain levels with c/o medial joint line pain today.  Discussed with the patient to work hard and be compliant to HEP.  Instructed patient in prone knee flexion stretch using a sheet.  Able to achieve full left knee extension passively with flexion limited to 102 degrees.                  GOALS:  Goals reviewed with patient? Yes  SHORT TERM GOALS: Target date: 09/14/2021  Independent with an initial HEP. Baseline: Goal status: INITIAL  2.  Full left knee extension in supine. Baseline:  Goal status: INITIAL   LONG TERM GOALS: Target date: 09/28/2021   Independent with an advanced HEP. Baseline:  Goal status: INITIAL  2.  Active left  knee flexion to 125 degrees+ so the patient can perform functional tasks and do so with pain not > 2-3/10. Baseline:  Goal status: INITIAL  3.  Increase left hip and knee strength to a 5/5 to provide good stability for accomplishment of functional activities. Baseline:  Goal status: INITIAL  4.  Perform a reciprocating stair gait with one railing with pain not > 2/10. Baseline:  Goal status: INITIAL  5.  Patient walk a community distance without assistive device. Baseline:  Goal status: INITIAL  6.  Return to PLOF. Baseline:  Goal status: INITIAL   PLAN: PT FREQUENCY:  2-3 times a week for 4 weeks.  PT DURATION: 4 weeks  PLANNED INTERVENTIONS: Therapeutic exercises, Therapeutic activity, Neuromuscular re-education, Balance training, Gait training, Patient/Family education, Stair training, Electrical stimulation, Cryotherapy, and Ultrasound  PLAN FOR NEXT SESSION:  Recumbent bike progression, progress per protocol, work on improving range of motion, rockerboard, work on quad activation (ie:  wall slides).   Keaun Schnabel, [place], PT 08/31/2021, 12:26 PM

## 2021-09-01 ENCOUNTER — Encounter: Payer: Self-pay | Admitting: Physical Therapy

## 2021-09-01 ENCOUNTER — Ambulatory Visit: Payer: Managed Care, Other (non HMO) | Admitting: Physical Therapy

## 2021-09-01 ENCOUNTER — Encounter: Payer: Managed Care, Other (non HMO) | Admitting: Physical Therapy

## 2021-09-01 DIAGNOSIS — R600 Localized edema: Secondary | ICD-10-CM

## 2021-09-01 DIAGNOSIS — M25562 Pain in left knee: Secondary | ICD-10-CM

## 2021-09-01 DIAGNOSIS — M25662 Stiffness of left knee, not elsewhere classified: Secondary | ICD-10-CM

## 2021-09-01 DIAGNOSIS — M6281 Muscle weakness (generalized): Secondary | ICD-10-CM

## 2021-09-01 NOTE — Therapy (Signed)
OUTPATIENT PHYSICAL THERAPY LOWER EXTREMITY TREATMENT   Patient Name: Tonya Franklin MRN: [id number] DOB:20-Nov-1995, 26 y.o., female Today's Date: 09/01/2021   PT End of Session - 09/01/21 1601     Visit Number 9    Number of Visits 12    Date for PT Re-Evaluation 08/26/21    PT Start Time 1601    PT Stop Time 2778    PT Time Calculation (min) 44 min    Activity Tolerance Patient tolerated treatment well    Behavior During Therapy [facility] for tasks assessed/performed              Past Medical History:  Diagnosis Date   Asthma    Bipolar disorder ([place])    BV (bacterial vaginosis) 05/31/2015   Depression with anxiety 10/10/2012   Hypertension 05/28/2018   IUD (intrauterine device) in place 10/23/2016   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   02/08/15 [facility] [place] LGSIL/mild dysplasia, HPV testing not done- never went for colpo, wants to f/u here     Colpo:____    Mood swings 2014   Obesity (BMI 30-39.9) 06/29/2017   Obsessive-compulsive disorder 06/29/2017   Right leg swelling 03/22/2015   Past Surgical History:  Procedure Laterality Date   adnoids     CYST EXCISION     cyst removed from face   Patient Active Problem List   Diagnosis Date Noted   Opioid abuse ([place]) 03/29/2021   Depression, major, single episode, moderate ([place]) 06/10/2020   GAD (generalized anxiety disorder) 06/10/2020   Hypertension 05/28/2018   Obsessive-compulsive disorder 06/29/2017   Alcohol abuse 06/29/2017   Overweight (BMI 25.0-29.9) 06/29/2017   IUD (intrauterine device) in place 10/23/2016   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  THERAPY DIAG:  Acute pain of left knee  Localized edema  Muscle weakness (generalized)  Stiffness of left knee, not elsewhere classified  Rationale for Evaluation and Treatment Rehabilitation  ONSET DATE: DOI:  06/22/21.   07/26/21 (surgery date).  SUBJECTIVE:   SUBJECTIVE STATEMENT:       Reports that she felt a bump along her L shin.   PERTINENT HISTORY: OCD.  PAIN:  Are you having pain? Yes: NPRS scale: 4/10 Pain location: Left knee. Pain description: sore Aggravating factors: Movement of left knee. Relieving factors: Nothing.     OBJECTIVE:   AROM Right  Left 09/01/2021  Hip flexion    Hip extension    Hip abduction    Hip adduction    Hip internal rotation    Hip external rotation    Knee flexion  105  Knee extension  3  Ankle dorsiflexion    Ankle plantarflexion    Ankle inversion    Ankle eversion     (Blank rows = not tested)    TODAY'S TREATMENT:                                     EXERCISE LOG  Exercise Repetitions and Resistance Comments  Stationary bike L2, seat 7-3 x15 min   Rockerboard X3 min   Lunges  Off 8" step x15 reps 100 deg measured at max  Squats X15 reps In // bars; VCs for equal weightbearing  Wall slides for knee flexion X20 reps 104 deg measured at max  SAQ X15 reps 5 sec holds  with ankle DF    Blank cell = exercise not performed today   HOME EXERCISE PROGRAM: Patient instructed for lunges or wall slides for knee flexion progression at home as well as SAQ. 2-3x per day.   ASSESSMENT:  CLINICAL IMPRESSION:               Patient presented in clinic with reports of mild pain today. Patient able to tolerate stationary bike and self progress with no extreme pain. Patient progressed to more active exercises to improve AROM knee flexion. Knee flexion monitored throughout flexion session. Patient also educated for proper squat and equal weighbearing in standing. Knee brace unlocked to 0-30 deg per [place] Applegate, MPT instruction but patient liked the new ROM for brace as she felt more normal with gait. Patient able to achieve 3-105 deg following end of PT session AROM but instructed in new HEP activities to progress ROM and quad strengthening. Patient verbalized  all HEP instruction.             GOALS:  Goals reviewed with patient? Yes  SHORT TERM GOALS: Target date: 09/15/2021  Independent with an initial HEP. Baseline: Goal status: On-going  2.  Full left knee extension in supine. Baseline:  Goal status: On-going   LONG TERM GOALS: Target date: 09/29/2021   Independent with an advanced HEP. Baseline:  Goal status: On-going  2.  Active left knee flexion to 125 degrees+ so the patient can perform functional tasks and do so with pain not > 2-3/10. Baseline:  Goal status: On-going  3.  Increase left hip and knee strength to a 5/5 to provide good stability for accomplishment of functional activities. Baseline:  Goal status: On-going  4.  Perform a reciprocating stair gait with one railing with pain not > 2/10. Baseline:  Goal status: On-going  5.  Patient walk a community distance without assistive device. Baseline:  Goal status: MET  6.  Return to PLOF. Baseline:  Goal status: On-going   PLAN: PT FREQUENCY:  2-3 times a week for 4 weeks.  PT DURATION: 4 weeks  PLANNED INTERVENTIONS: Therapeutic exercises, Therapeutic activity, Neuromuscular re-education, Balance training, Gait training, Patient/Family education, Stair training, Electrical stimulation, Cryotherapy, and Ultrasound  PLAN FOR NEXT SESSION:  Recumbent bike progression, progress per protocol, work on improving range of motion, rockerboard, work on quad activation (ie:  wall slides).   Standley Brooking, PTA 09/01/2021, 4:58 PM

## 2021-09-06 ENCOUNTER — Encounter: Payer: Managed Care, Other (non HMO) | Admitting: *Deleted

## 2021-09-07 ENCOUNTER — Ambulatory Visit: Payer: Managed Care, Other (non HMO) | Admitting: Physical Therapy

## 2021-09-07 DIAGNOSIS — M25662 Stiffness of left knee, not elsewhere classified: Secondary | ICD-10-CM

## 2021-09-07 DIAGNOSIS — M25562 Pain in left knee: Secondary | ICD-10-CM

## 2021-09-07 DIAGNOSIS — R600 Localized edema: Secondary | ICD-10-CM

## 2021-09-07 DIAGNOSIS — M6281 Muscle weakness (generalized): Secondary | ICD-10-CM

## 2021-09-07 NOTE — Therapy (Signed)
OUTPATIENT PHYSICAL THERAPY LOWER EXTREMITY TREATMENT   Patient Name: Tonya Franklin MRN: [id number] DOB:[birth year], 26 y.o., female Today's Date: 09/07/2021   PT End of Session - 09/07/21 1409     Visit Number 10    Number of Visits 12    Date for PT Re-Evaluation 08/26/21    PT Start Time 0100    PT Stop Time 0143    PT Time Calculation (min) 43 min    Activity Tolerance Patient tolerated treatment well    Behavior During Therapy [facility] for tasks assessed/performed               Past Medical History:  Diagnosis Date   Asthma    Bipolar disorder ([place])    BV (bacterial vaginosis) 05/31/2015   Depression with anxiety 10/10/2012   Hypertension 05/28/2018   IUD (intrauterine device) in place 10/23/2016   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   02/08/15 [facility] LGSIL/mild dysplasia, HPV testing not done- never went for colpo, wants to f/u here     Colpo:____    Mood swings 2014   Obesity (BMI 30-39.9) 06/29/2017   Obsessive-compulsive disorder 06/29/2017   Right leg swelling 03/22/2015   Past Surgical History:  Procedure Laterality Date   adnoids     CYST EXCISION     cyst removed from face   Patient Active Problem List   Diagnosis Date Noted   Opioid abuse ([place]) 03/29/2021   Depression, major, single episode, moderate ([place]) 06/10/2020   GAD (generalized anxiety disorder) 06/10/2020   Hypertension 05/28/2018   Obsessive-compulsive disorder 06/29/2017   Alcohol abuse 06/29/2017   Overweight (BMI 25.0-29.9) 06/29/2017   IUD (intrauterine device) in place 10/23/2016   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  THERAPY DIAG:  No diagnosis found.  Rationale for Evaluation and Treatment Rehabilitation  ONSET DATE: DOI:  06/22/21.  07/26/21 (surgery date).  SUBJECTIVE:   SUBJECTIVE STATEMENT:      Doing good today. PERTINENT  HISTORY: OCD.  PAIN:  Are you having pain? Yes: NPRS scale: 4/10 Pain location: Left knee. Pain description: sore Aggravating factors: Movement of left knee. Relieving factors: Nothing.     OBJECTIVE:   AROM Right  Left 09/07/2021  Hip flexion    Hip extension    Hip abduction    Hip adduction    Hip internal rotation    Hip external rotation    Knee flexion  110  Knee extension  3 (full passive)  Ankle dorsiflexion    Ankle plantarflexion    Ankle inversion    Ankle eversion     (Blank rows = not tested)    TODAY'S TREATMENT:                                     EXERCISE LOG 09/07/21  Exercise Repetitions and Resistance Comments  Stationary bike L2, seat 7- 1 x 20 min   Rockerboard X 5 min   Wall slides to 30 degrees To fatigue.               Manual:  Sustained left hamstring and overpressure stretching x 7 minutes.  HOME EXERCISE PROGRAM: [place] Created by [place] Maeli Spacek Aug 9th, 2023 View at [email or web address] using code: [email or web address] Total 1 Page 1 of 1 Squats- Wall NO PAIN. -Start  with standing against a wall; with feet a few feet away from wall. -Keep feet hip width apart; chest up tall. -Slow and controlled, slide your back down the wall. -Slide back down enough to where you look like you are sitting in a chair. -Don't let the knees flex further than 30 (perform with knee brace) degrees; don't let knees go over toes. -Keep back against the wall. -Don't hold on to your legs or keep hands on thighs. - Repeat 15 Times Hold 2 Seconds Complete 2 Sets   ASSESSMENT:  CLINICAL IMPRESSION:              Good progress today with patient achieving left knee flexion to 110 degrees.  She can achieve full left knee extension passively.  Recommnended she perform the prone hang for 10 minutes three times a day.  Also to perform wall slides with brace on to 30 degrees. GOALS:  Goals reviewed with patient? Yes  SHORT TERM GOALS: Target  date: 09/21/2021  Independent with an initial HEP. Baseline: Goal status: On-going  2.  Full left knee extension in supine. Baseline:  Goal status: On-going   LONG TERM GOALS: Target date: 10/05/2021   Independent with an advanced HEP. Baseline:  Goal status: On-going  2.  Active left knee flexion to 125 degrees+ so the patient can perform functional tasks and do so with pain not > 2-3/10. Baseline:  Goal status: On-going  3.  Increase left hip and knee strength to a 5/5 to provide good stability for accomplishment of functional activities. Baseline:  Goal status: On-going  4.  Perform a reciprocating stair gait with one railing with pain not > 2/10. Baseline:  Goal status: On-going  5.  Patient walk a community distance without assistive device. Baseline:  Goal status: MET  6.  Return to PLOF. Baseline:  Goal status: On-going   PLAN: PT FREQUENCY:  2-3 times a week for 4 weeks.  PT DURATION: 4 weeks  PLANNED INTERVENTIONS: Therapeutic exercises, Therapeutic activity, Neuromuscular re-education, Balance training, Gait training, Patient/Family education, Stair training, Electrical stimulation, Cryotherapy, and Ultrasound  PLAN FOR NEXT SESSION:  Recumbent bike progression, progress per protocol, work on improving range of motion, rockerboard, work on quad activation (ie:  wall slides).    Progress Note Reporting Period 07/29/21 to 09/07/21  See note below for Objective Data and Assessment of Progress/Goals. Patient achieved left knee flexion to 110 degrees.  She lacks extension but can achieve full range passively.    Adnan Vanvoorhis, [place], PT 09/07/2021, 2:09 PM

## 2021-09-08 ENCOUNTER — Ambulatory Visit (INDEPENDENT_AMBULATORY_CARE_PROVIDER_SITE_OTHER): Payer: Self-pay | Admitting: Family

## 2021-09-08 ENCOUNTER — Ambulatory Visit: Payer: Managed Care, Other (non HMO) | Admitting: *Deleted

## 2021-09-08 ENCOUNTER — Ambulatory Visit (INDEPENDENT_AMBULATORY_CARE_PROVIDER_SITE_OTHER): Payer: Self-pay

## 2021-09-08 ENCOUNTER — Encounter: Payer: Self-pay | Admitting: Family

## 2021-09-08 ENCOUNTER — Encounter: Payer: Self-pay | Admitting: *Deleted

## 2021-09-08 VITALS — BP 119/81 | HR 84 | Temp 98.1°F | Ht 64.0 in | Wt 166.0 lb

## 2021-09-08 DIAGNOSIS — M6281 Muscle weakness (generalized): Secondary | ICD-10-CM

## 2021-09-08 DIAGNOSIS — I10 Essential (primary) hypertension: Secondary | ICD-10-CM

## 2021-09-08 DIAGNOSIS — M25662 Stiffness of left knee, not elsewhere classified: Secondary | ICD-10-CM

## 2021-09-08 DIAGNOSIS — R1030 Lower abdominal pain, unspecified: Secondary | ICD-10-CM

## 2021-09-08 DIAGNOSIS — M25562 Pain in left knee: Secondary | ICD-10-CM | POA: Diagnosis not present

## 2021-09-08 DIAGNOSIS — F410 Panic disorder [episodic paroxysmal anxiety] without agoraphobia: Secondary | ICD-10-CM

## 2021-09-08 DIAGNOSIS — F418 Other specified anxiety disorders: Secondary | ICD-10-CM

## 2021-09-08 DIAGNOSIS — M545 Low back pain, unspecified: Secondary | ICD-10-CM

## 2021-09-08 DIAGNOSIS — R600 Localized edema: Secondary | ICD-10-CM

## 2021-09-08 DIAGNOSIS — K5900 Constipation, unspecified: Secondary | ICD-10-CM

## 2021-09-08 LAB — CBC WITH DIFFERENTIAL/PLATELET
Basophils Absolute: 0 10*3/uL (ref 0.0–0.2)
Basos: 0 %
EOS (ABSOLUTE): 0.1 10*3/uL (ref 0.0–0.4)
Eos: 1 %
Hematocrit: 40.1 % (ref 34.0–46.6)
Hemoglobin: 13.3 g/dL (ref 11.1–15.9)
Immature Grans (Abs): 0 10*3/uL (ref 0.0–0.1)
Immature Granulocytes: 0 %
Lymphocytes Absolute: 2.1 10*3/uL (ref 0.7–3.1)
Lymphs: 28 %
MCH: 29.7 pg (ref 26.6–33.0)
MCHC: 33.2 g/dL (ref 31.5–35.7)
MCV: 90 fL (ref 79–97)
Monocytes Absolute: 0.4 10*3/uL (ref 0.1–0.9)
Monocytes: 6 %
Neutrophils Absolute: 4.8 10*3/uL (ref 1.4–7.0)
Neutrophils: 65 %
Platelets: 250 10*3/uL (ref 150–450)
RBC: 4.48 x10E6/uL (ref 3.77–5.28)
RDW: 12.1 % (ref 11.7–15.4)
WBC: 7.5 10*3/uL (ref 3.4–10.8)

## 2021-09-08 LAB — URINALYSIS, COMPLETE
Bilirubin, UA: NEGATIVE
Glucose, UA: NEGATIVE
Ketones, UA: NEGATIVE
Leukocytes,UA: NEGATIVE
Nitrite, UA: NEGATIVE
Protein,UA: NEGATIVE
RBC, UA: NEGATIVE
Specific Gravity, UA: >1.03 — ABNORMAL HIGH (ref 1.005–1.030)
Urobilinogen, Ur: 0.2 mg/dL (ref 0.2–1.0)
pH, UA: 5.5 (ref 5.0–7.5)

## 2021-09-08 LAB — MICROSCOPIC EXAMINATION
Bacteria, UA: NONE SEEN
RBC, Urine: NONE SEEN /hpf (ref 0–2)
Renal Epithel, UA: NONE SEEN /hpf

## 2021-09-08 LAB — CMP14+EGFR
ALT: 19 IU/L (ref 0–32)
AST: 15 IU/L (ref 0–40)
Albumin/Globulin Ratio: 2 (ref 1.2–2.2)
Albumin: 4.3 g/dL (ref 4.0–5.0)
Alkaline Phosphatase: 63 IU/L (ref 44–121)
BUN/Creatinine Ratio: 12 (ref 9–23)
BUN: 9 mg/dL (ref 6–20)
Bilirubin Total: <0.2 mg/dL (ref 0.0–1.2)
CO2: 24 mmol/L (ref 20–29)
Calcium: 9.2 mg/dL (ref 8.7–10.2)
Chloride: 101 mmol/L (ref 96–106)
Creatinine, Ser: 0.76 mg/dL (ref 0.57–1.00)
Globulin, Total: 2.1 g/dL (ref 1.5–4.5)
Glucose: 85 mg/dL (ref 70–99)
Potassium: 4.4 mmol/L (ref 3.5–5.2)
Sodium: 140 mmol/L (ref 134–144)
Total Protein: 6.4 g/dL (ref 6.0–8.5)
eGFR: 110 mL/min/{1.73_m2} (ref >59)

## 2021-09-08 MED ORDER — HYDROXYZINE PAMOATE 25 MG PO CAPS: ORAL_CAPSULE | ORAL | 1 refills | Status: DC

## 2021-09-08 NOTE — Therapy (Signed)
OUTPATIENT PHYSICAL THERAPY LOWER EXTREMITY TREATMENT   Patient Name: Tonya Franklin MRN: [id number] DOB:[birth year], 26 y.o., female Today's Date: 09/08/2021   PT End of Session - 09/08/21 1133     Visit Number 11    Number of Visits 12    Date for PT Re-Evaluation 08/26/21    PT Start Time 1115    PT Stop Time 1205    PT Time Calculation (min) 50 min               Past Medical History:  Diagnosis Date   Asthma    Bipolar disorder ([place])    BV (bacterial vaginosis) 05/31/2015   Depression with anxiety 10/10/2012   Hypertension 05/28/2018   IUD (intrauterine device) in place 10/23/2016   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   02/08/15 [facility] LGSIL/mild dysplasia, HPV testing not done- never went for colpo, wants to f/u here     Colpo:____    Mood swings 2014   Obesity (BMI 30-39.9) 06/29/2017   Obsessive-compulsive disorder 06/29/2017   Right leg swelling 03/22/2015   Past Surgical History:  Procedure Laterality Date   adnoids     CYST EXCISION     cyst removed from face   Patient Active Problem List   Diagnosis Date Noted   Opioid abuse ([place]) 03/29/2021   Depression, major, single episode, moderate ([place]) 06/10/2020   GAD (generalized anxiety disorder) 06/10/2020   Hypertension 05/28/2018   Obsessive-compulsive disorder 06/29/2017   Alcohol abuse 06/29/2017   Overweight (BMI 25.0-29.9) 06/29/2017   IUD (intrauterine device) in place 10/23/2016   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  THERAPY DIAG:  Acute pain of left knee  Localized edema  Muscle weakness (generalized)  Stiffness of left knee, not elsewhere classified  Rationale for Evaluation and Treatment Rehabilitation  ONSET DATE: DOI:  06/22/21.  07/26/21 (surgery date).  SUBJECTIVE:   SUBJECTIVE STATEMENT:      Doing good today. Working on it at  home [place]: OCD.  PAIN:  Are you having pain? Yes: NPRS scale: 4/10 Pain location: Left knee. Pain description: sore Aggravating factors: Movement of left knee. Relieving factors: Nothing.     OBJECTIVE:   AROM Right  Left 09/07/2021  Hip flexion    Hip extension    Hip abduction    Hip adduction    Hip internal rotation    Hip external rotation    Knee flexion  110  Knee extension  3 (full passive)  Ankle dorsiflexion    Ankle plantarflexion    Ankle inversion    Ankle eversion     (Blank rows = not tested)    TODAY'S TREATMENT:                                     EXERCISE LOG 09/08/21  Exercise Repetitions and Resistance Comments  Stationary bike L2, seat 7- 2 x 15 min Cues to keep LT ankle DF  Rockerboard X 6 min  DF/PF and balance   Wall slides to 30 degrees    Rock back onto LT foot/heel For quad activation x20   Toe ups with quad set 2x10    Calf raise with quad set 2x10   Manual:  Sustained left extension with overpressure stretching x 3 minutes. [place] Estim x12 mins  10secs on /off wi quad sets    HOME EXERCISE PROGRAM: [place] by [place] Applegate Aug 9th, 2023 View at [email or web address] using code: [email or web address] Total 1 Page 1 of 1 Squats- Wall NO PAIN. -Start with standing against a wall; with feet a few feet away from wall. -Keep feet hip width apart; chest up tall. -Slow and controlled, slide your back down the wall. -Slide back down enough to where you look like you are sitting in a chair. -Don't let the knees flex further than 30 (perform with knee brace) degrees; don't let knees go over toes. -Keep back against the wall. -Don't hold on to your legs or keep hands on thighs. - Repeat 15 Times Hold 2 Seconds Complete 2 Sets   ASSESSMENT:  CLINICAL IMPRESSION:   Pt arrived today with brace donned LT LE. Rx focused on ROM, ctivation and strengthening as well as balance and ankle  strengthening.                    GOALS:  Goals reviewed with patient? Yes  SHORT TERM GOALS: Target date: 09/22/2021  Independent with an initial HEP. Baseline: Goal status:MET  2.  Full left knee extension in supine. Baseline:  Goal status: MET PROM   LONG TERM GOALS: Target date: 10/06/2021   Independent with an advanced HEP. Baseline:  Goal status: On-going  2.  Active left knee flexion to 125 degrees+ so the patient can perform functional tasks and do so with pain not > 2-3/10. Baseline:  Goal status: On-going  3.  Increase left hip and knee strength to a 5/5 to provide good stability for accomplishment of functional activities. Baseline:  Goal status: On-going  4.  Perform a reciprocating stair gait with one railing with pain not > 2/10. Baseline:  Goal status: On-going  5.  Patient walk a community distance without assistive device. Baseline:  Goal status: MET  6.  Return to PLOF. Baseline:  Goal status: On-going   PLAN: PT FREQUENCY:  2-3 times a week for 4 weeks.  PT DURATION: 4 weeks  PLANNED INTERVENTIONS: Therapeutic exercises, Therapeutic activity, Neuromuscular re-education, Balance training, Gait training, Patient/Family education, Stair training, Electrical stimulation, Cryotherapy, and Ultrasound  PLAN FOR NEXT SESSION:  Recumbent bike progression, progress per protocol, work on improving range of motion, rockerboard, work on quad activation (ie:  wall slides).        Darlene Bartelt,CHRIS, PTA 09/08/2021, 3:51 PM

## 2021-09-08 NOTE — Patient Instructions (Signed)

## 2021-09-08 NOTE — Progress Notes (Signed)
Subjective:    Patient ID: Tonya Franklin, female    DOB: 13-Jul-1995, 26 y.o.   MRN: [id number]  Chief Complaint  Patient presents with   Abdominal Pain    Just really feels bad cant really explain    Back Pain   cramps in legs    PT presents to the office today with complaints of "feeling bad" the last three weeks. Reports lower abdominal cramping and back pain.   She tore her ACL and has surgery repair 07/25/21. This has increased her anxiety and depression.  Abdominal Pain This is a new problem. The current episode started 1 to 4 weeks ago. The onset quality is gradual. The problem occurs intermittently. The pain is located in the suprapubic region and epigastric region. The pain is at a severity of 7/10 (0 right now). The pain is mild. The quality of the pain is cramping. Associated symptoms include constipation, flatus, frequency, headaches and nausea. Pertinent negatives include no belching. The pain is relieved by Nothing. She has tried antacids for the symptoms. The treatment provided mild relief.  Back Pain Associated symptoms include abdominal pain and headaches.  Sore Throat  This is a new problem. The current episode started 1 to 4 weeks ago. The problem has been waxing and waning. There has been no fever. Associated symptoms include abdominal pain, ear pain, headaches, a hoarse voice and swollen glands. Pertinent negatives include no shortness of breath or trouble swallowing. She has tried acetaminophen for the symptoms. The treatment provided mild relief.  Rash This is a new problem. The current episode started more than 1 month ago. The problem has been waxing and waning since onset. Location: bilateral legs. The rash is characterized by redness. She was exposed to nothing. Pertinent negatives include no shortness of breath. Past treatments include anti-itch cream. The treatment provided mild relief.  Nicotine Dependence Presents for follow-up visit. Her urge triggers  include company of smokers. The symptoms have been stable. She smokes < 1/2 a pack of cigarettes per day.  Hypertension This is a chronic problem. The current episode started more than 1 year ago. The problem has been resolved since onset. The problem is controlled. Associated symptoms include headaches and malaise/fatigue. Pertinent negatives include no peripheral edema or shortness of breath. Risk factors for coronary artery disease include dyslipidemia. The current treatment provides moderate improvement.      Review of Systems  Constitutional:  Positive for malaise/fatigue.  HENT:  Positive for ear pain and hoarse voice. Negative for trouble swallowing.   Respiratory:  Negative for shortness of breath.   Gastrointestinal:  Positive for abdominal pain, constipation, flatus and nausea.  Genitourinary:  Positive for frequency.  Musculoskeletal:  Positive for back pain.  Skin:  Positive for rash.  Neurological:  Positive for headaches.  All other systems reviewed and are negative.      Objective:   Physical Exam Vitals reviewed.  Constitutional:      General: She is not in acute distress.    Appearance: She is well-developed.  HENT:     Head: Normocephalic and atraumatic.     Right Ear: External ear normal.  Eyes:     Pupils: Pupils are equal, round, and reactive to light.  Neck:     Thyroid: No thyromegaly.  Cardiovascular:     Rate and Rhythm: Normal rate and regular rhythm.     Heart sounds: Normal heart sounds. No murmur heard. Pulmonary:     Effort: Pulmonary effort is normal.  No respiratory distress.     Breath sounds: Normal breath sounds. No wheezing.  Abdominal:     General: Bowel sounds are normal. There is no distension.     Palpations: Abdomen is soft.     Tenderness: There is no abdominal tenderness.  Musculoskeletal:        General: No tenderness.     Cervical back: Normal range of motion and neck supple.     Comments: Left leg in brace, unable to flex   Skin:    General: Skin is warm and dry.  Neurological:     Mental Status: She is alert and oriented to person, place, and time.     Cranial Nerves: No cranial nerve deficit.     Deep Tendon Reflexes: Reflexes are normal and symmetric.  Psychiatric:        Behavior: Behavior normal.        Thought Content: Thought content normal.        Judgment: Judgment normal.       BP 119/81   Pulse 84   Temp 98.1 F (36.7 C) (Temporal)   Ht _0  (1.626 m)   Wt 166 lb (75.3 kg)   BMI 28.49 kg/m      Assessment & Plan:  Beth Goodlin Giebler comes in today with chief complaint of Abdominal Pain (Just really feels bad cant really explain ), Back Pain, and cramps in legs    Diagnosis and orders addressed:  1. Low back pain without sciatica, unspecified back pain laterality, unspecified chronicity - Urine Culture - Urinalysis, Complete - CMP14+EGFR - CBC with Differential/Platelet  2. Depression with anxiety - hydrOXYzine (VISTARIL) 25 MG capsule; TAKE 1 CAPSULE BY MOUTH 3 TIMES DAILY AS NEEDED.  Dispense: 90 capsule; Refill: 1 - CMP14+EGFR - CBC with Differential/Platelet  3. Panic attack - hydrOXYzine (VISTARIL) 25 MG capsule; TAKE 1 CAPSULE BY MOUTH 3 TIMES DAILY AS NEEDED.  Dispense: 90 capsule; Refill: 1 - CMP14+EGFR - CBC with Differential/Platelet  4. Primary hypertension - CMP14+EGFR - CBC with Differential/Platelet  5. Constipation, unspecified constipation type - CMP14+EGFR - CBC with Differential/Platelet  6. Lower abdominal pain - DG Abd 1 View - CMP14+EGFR - CBC with Differential/Platelet   Labs pending Health Maintenance reviewed Diet and exercise encouraged  Follow up plan: If symptoms worsen or do not improve   Evelina Dun, FNP

## 2021-09-09 ENCOUNTER — Ambulatory Visit: Payer: Managed Care, Other (non HMO) | Admitting: Physical Therapy

## 2021-09-09 LAB — URINE CULTURE: Organism ID, Bacteria: NO GROWTH

## 2021-09-13 ENCOUNTER — Ambulatory Visit: Payer: Managed Care, Other (non HMO) | Admitting: *Deleted

## 2021-09-13 ENCOUNTER — Encounter: Payer: Self-pay | Admitting: *Deleted

## 2021-09-13 DIAGNOSIS — R600 Localized edema: Secondary | ICD-10-CM

## 2021-09-13 DIAGNOSIS — M25562 Pain in left knee: Secondary | ICD-10-CM

## 2021-09-13 DIAGNOSIS — M6281 Muscle weakness (generalized): Secondary | ICD-10-CM

## 2021-09-13 DIAGNOSIS — M25662 Stiffness of left knee, not elsewhere classified: Secondary | ICD-10-CM

## 2021-09-13 NOTE — Therapy (Signed)
OUTPATIENT PHYSICAL THERAPY LOWER EXTREMITY TREATMENT   Patient Name: Tonya Franklin MRN: [id number] DOB:[birth year], 26 y.o., female Today's Date: 09/13/2021   PT End of Session - 09/13/21 1538     Visit Number 12    Number of Visits 12    Date for PT Re-Evaluation 08/26/21    PT Start Time 9476    PT Stop Time 1608    PT Time Calculation (min) 53 min               Past Medical History:  Diagnosis Date   Asthma    Bipolar disorder ([place])    BV (bacterial vaginosis) 05/31/2015   Depression with anxiety 10/10/2012   Hypertension 05/28/2018   IUD (intrauterine device) in place 10/23/2016   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   02/08/15 [facility] LGSIL/mild dysplasia, HPV testing not done- never went for colpo, wants to f/u here     Colpo:____    Mood swings 2014   Obesity (BMI 30-39.9) 06/29/2017   Obsessive-compulsive disorder 06/29/2017   Right leg swelling 03/22/2015   Past Surgical History:  Procedure Laterality Date   adnoids     CYST EXCISION     cyst removed from face   Patient Active Problem List   Diagnosis Date Noted   Opioid abuse ([place]) 03/29/2021   Depression, major, single episode, moderate ([place]) 06/10/2020   GAD (generalized anxiety disorder) 06/10/2020   Hypertension 05/28/2018   Obsessive-compulsive disorder 06/29/2017   Alcohol abuse 06/29/2017   Overweight (BMI 25.0-29.9) 06/29/2017   IUD (intrauterine device) in place 10/23/2016   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  THERAPY DIAG:  Acute pain of left knee  Localized edema  Muscle weakness (generalized)  Stiffness of left knee, not elsewhere classified  Rationale for Evaluation and Treatment Rehabilitation  ONSET DATE: DOI:  06/22/21.  07/26/21 (surgery date).  SUBJECTIVE:   SUBJECTIVE STATEMENT:      Doing better with walking and  bending PERTINENT HISTORY: OCD.  PAIN:  Are you having pain? Yes: NPRS scale: 4/10 Pain location: Left knee. Pain description: sore Aggravating factors: Movement of left knee. Relieving factors: Nothing.     OBJECTIVE:   AROM Right  Left 09/07/2021  Hip flexion    Hip extension    Hip abduction    Hip adduction    Hip internal rotation    Hip external rotation    Knee flexion  110  Knee extension  3 (full passive)  Ankle dorsiflexion    Ankle plantarflexion    Ankle inversion    Ankle eversion     (Blank rows = not tested)    TODAY'S TREATMENT:                                     EXERCISE LOG 09/13/21  Exercise Repetitions and Resistance Comments  Stationary bike L2, seat 7- 2 x 15 min Cues to keep LT ankle DF  Rockerboard X 6 min  DF/PF and balance   Wall slides to 30 degrees    Rock back onto LT foot/heel    Prone quad set  2x10 hold 5 secs   Sitting 90 degree quad isometrics  x10 hold 5 secs   Toe ups with quad set 2x10    Calf raise with quad set 2x10  Manual:  Sustained left kne flexion and extension with overpressure stretching [place] Estim x22mns 10secs on /off with SAQ's 2#    HOME EXERCISE PROGRAM: [place]by CMaliApplegate Aug 9th, 2023 View at [email or web address] using code: [email or web address] Total 1 Page 1 of 1 Squats- Wall NO PAIN. -Start with standing against a wall; with feet a few feet away from wall. -Keep feet hip width apart; chest up tall. -Slow and controlled, slide your back down the wall. -Slide back down enough to where you look like you are sitting in a chair. -Don't let the knees flex further than 30 (perform with knee brace) degrees; don't let knees go over toes. -Keep back against the wall. -Don't hold on to your legs or keep hands on thighs. - Repeat 15 Times Hold 2 Seconds Complete 2 Sets   ASSESSMENT:  CLINICAL IMPRESSION:   Pt arrived today with brace donned LT LE. Rx focused on ROM,  muscle activation and strengthening as well as  ankle strengthening. Prone quad sets and 90 degree isometrics performed today for quad activation due to Pt still with poor VMO activation with supine quad set. [place] for facilitation.                     GOALS:  Goals reviewed with patient? Yes  SHORT TERM GOALS: Target date: 09/27/2021  Independent with an initial HEP. Baseline: Goal status:MET  2.  Full left knee extension in supine. Baseline:  Goal status: MET PROM   LONG TERM GOALS: Target date: 10/11/2021   Independent with an advanced HEP. Baseline:  Goal status: On-going  2.  Active left knee flexion to 125 degrees+ so the patient can perform functional tasks and do so with pain not > 2-3/10. Baseline:  Goal status: On-going  3.  Increase left hip and knee strength to a 5/5 to provide good stability for accomplishment of functional activities. Baseline:  Goal status: On-going  4.  Perform a reciprocating stair gait with one railing with pain not > 2/10. Baseline:  Goal status: On-going  5.  Patient walk a community distance without assistive device. Baseline:  Goal status: MET  6.  Return to PLOF. Baseline:  Goal status: On-going   PLAN: PT FREQUENCY:  2-3 times a week for 4 weeks.  PT DURATION: 4 weeks  PLANNED INTERVENTIONS: Therapeutic exercises, Therapeutic activity, Neuromuscular re-education, Balance training, Gait training, Patient/Family education, Stair training, Electrical stimulation, Cryotherapy, and Ultrasound  PLAN FOR NEXT SESSION:  Recumbent bike progression, progress per protocol, work on improving range of motion, rockerboard, work on quad activation (ie:  wall slides).        Naquisha Whitehair,CHRIS, PTA 09/13/2021, 4:09 PM

## 2021-09-14 ENCOUNTER — Ambulatory Visit: Payer: Managed Care, Other (non HMO) | Admitting: Physical Therapy

## 2021-09-14 ENCOUNTER — Encounter: Payer: Self-pay | Admitting: Physical Therapy

## 2021-09-14 DIAGNOSIS — M6281 Muscle weakness (generalized): Secondary | ICD-10-CM

## 2021-09-14 DIAGNOSIS — M25562 Pain in left knee: Secondary | ICD-10-CM | POA: Diagnosis not present

## 2021-09-14 DIAGNOSIS — M25662 Stiffness of left knee, not elsewhere classified: Secondary | ICD-10-CM

## 2021-09-14 DIAGNOSIS — R600 Localized edema: Secondary | ICD-10-CM

## 2021-09-14 NOTE — Addendum Note (Signed)
Addended by: Clenton Esper, [place] W on: 09/14/2021 08:45 AM   Modules accepted: Orders

## 2021-09-14 NOTE — Therapy (Signed)
OUTPATIENT PHYSICAL THERAPY LOWER EXTREMITY TREATMENT   Patient Name: Tonya Franklin MRN: [id number] DOB:February 03, 1995, 26 y.o., female Today's Date: 09/14/2021   PT End of Session - 09/14/21 1406     Visit Number 13    Number of Visits 18    Date for PT Re-Evaluation 10/05/21    PT Start Time 0152    PT Stop Time 0234    PT Time Calculation (min) 42 min    Activity Tolerance Patient tolerated treatment well    Behavior During Therapy [facility] for tasks assessed/performed               Past Medical History:  Diagnosis Date   Asthma    Bipolar disorder ([place])    BV (bacterial vaginosis) 05/31/2015   Depression with anxiety 10/10/2012   Hypertension 05/28/2018   IUD (intrauterine device) in place 10/23/2016   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   02/08/15 [facility] [place] LGSIL/mild dysplasia, HPV testing not done- never went for colpo, wants to f/u here     Colpo:____    Mood swings 2014   Obesity (BMI 30-39.9) 06/29/2017   Obsessive-compulsive disorder 06/29/2017   Right leg swelling 03/22/2015   Past Surgical History:  Procedure Laterality Date   adnoids     CYST EXCISION     cyst removed from face   Patient Active Problem List   Diagnosis Date Noted   Opioid abuse ([place]) 03/29/2021   Depression, major, single episode, moderate ([place]) 06/10/2020   GAD (generalized anxiety disorder) 06/10/2020   Hypertension 05/28/2018   Obsessive-compulsive disorder 06/29/2017   Alcohol abuse 06/29/2017   Overweight (BMI 25.0-29.9) 06/29/2017   IUD (intrauterine device) in place 10/23/2016   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  THERAPY DIAG:  Acute pain of left knee  Localized edema  Muscle weakness (generalized)  Stiffness of left knee, not elsewhere classified  Rationale for Evaluation and Treatment Rehabilitation  ONSET DATE: DOI:  06/22/21.   07/26/21 (surgery date).  SUBJECTIVE:   SUBJECTIVE STATEMENT:     Good day.  Back to work. PERTINENT HISTORY: OCD.  PAIN:  Are you having pain? Yes: NPRS scale: 4/10 Pain location: Left knee. Pain description: sore Aggravating factors: Movement of left knee. Relieving factors: Nothing.     OBJECTIVE:     TODAY'S TREATMENT:                                     EXERCISE LOG 09/14/21  Exercise Repetitions and Resistance Comments  Stationary bike L2, seat 7- 1x 20 min   Rockerboard X 5 min  DF/PF and balance                           Manual:  Sustained left knee flexion and extension with overpressure stretching x 13 minutes.  ASSESSMENT:  CLINICAL IMPRESSION:  Patient needs continued work on left quad activation.  She was able to achieve 115 degrees of left knee flexion today.  Her brace is now locked at 45 degrees.                    GOALS:  Goals reviewed with patient? Yes  SHORT TERM GOALS: Target date: 09/28/2021  Independent with an initial HEP. Baseline: Goal status:MET  2.  Full left knee extension in supine. Baseline:  Goal status: MET PROM   LONG TERM GOALS: Target date: 10/12/2021   Independent with an advanced HEP. Baseline:  Goal status: On-going  2.  Active left knee flexion to 125 degrees+ so the patient can perform functional tasks and do so with pain not > 2-3/10. Baseline:  Goal status: On-going  3.  Increase left hip and knee strength to a 5/5 to provide good stability for accomplishment of functional activities. Baseline:  Goal status: On-going  4.  Perform a reciprocating stair gait with one railing with pain not > 2/10. Baseline:  Goal status: On-going  5.  Patient walk a community distance without assistive device. Baseline:  Goal status: MET  6.  Return to PLOF. Baseline:  Goal status: On-going   PLAN: PT FREQUENCY:  2-3 times a week for 4 weeks.  PT DURATION: 4 weeks  PLANNED INTERVENTIONS: Therapeutic  exercises, Therapeutic activity, Neuromuscular re-education, Balance training, Gait training, Patient/Family education, Stair training, Electrical stimulation, Cryotherapy, and Ultrasound  PLAN FOR NEXT SESSION:  Recumbent bike progression, progress per protocol, work on improving range of motion, rockerboard, work on quad activation (ie:  wall slides).        Odell Fasching, [place], PT 09/14/2021, 2:50 PM

## 2021-09-15 ENCOUNTER — Ambulatory Visit: Payer: Managed Care, Other (non HMO) | Admitting: *Deleted

## 2021-09-15 ENCOUNTER — Encounter: Payer: Self-pay | Admitting: *Deleted

## 2021-09-15 DIAGNOSIS — R600 Localized edema: Secondary | ICD-10-CM

## 2021-09-15 DIAGNOSIS — M25662 Stiffness of left knee, not elsewhere classified: Secondary | ICD-10-CM

## 2021-09-15 DIAGNOSIS — M25562 Pain in left knee: Secondary | ICD-10-CM | POA: Diagnosis not present

## 2021-09-15 DIAGNOSIS — M6281 Muscle weakness (generalized): Secondary | ICD-10-CM

## 2021-09-15 NOTE — Therapy (Signed)
OUTPATIENT PHYSICAL THERAPY LOWER EXTREMITY TREATMENT   Patient Name: Tonya Franklin MRN: [id number] DOB:Jun 03, 1995, 26 y.o., female Today's Date: 09/15/2021   PT End of Session - 09/15/21 1607     Visit Number 14    Number of Visits 18    Date for PT Re-Evaluation 10/05/21    PT Start Time 1600    PT Stop Time 2703    PT Time Calculation (min) 50 min               Past Medical History:  Diagnosis Date   Asthma    Bipolar disorder ([place])    BV (bacterial vaginosis) 05/31/2015   Depression with anxiety 10/10/2012   Hypertension 05/28/2018   IUD (intrauterine device) in place 10/23/2016   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   02/08/15 [facility] [place] LGSIL/mild dysplasia, HPV testing not done- never went for colpo, wants to f/u here     Colpo:____    Mood swings 2014   Obesity (BMI 30-39.9) 06/29/2017   Obsessive-compulsive disorder 06/29/2017   Right leg swelling 03/22/2015   Past Surgical History:  Procedure Laterality Date   adnoids     CYST EXCISION     cyst removed from face   Patient Active Problem List   Diagnosis Date Noted   Opioid abuse ([place]) 03/29/2021   Depression, major, single episode, moderate ([place]) 06/10/2020   GAD (generalized anxiety disorder) 06/10/2020   Hypertension 05/28/2018   Obsessive-compulsive disorder 06/29/2017   Alcohol abuse 06/29/2017   Overweight (BMI 25.0-29.9) 06/29/2017   IUD (intrauterine device) in place 10/23/2016   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  THERAPY DIAG:  Acute pain of left knee  Localized edema  Muscle weakness (generalized)  Stiffness of left knee, not elsewhere classified  Rationale for Evaluation and Treatment Rehabilitation  ONSET DATE: DOI:  06/22/21.  07/26/21 (surgery date).  SUBJECTIVE:   SUBJECTIVE STATEMENT:    Doing exs at home    PERTINENT  HISTORY: OCD.  PAIN:  Are you having pain? Yes: NPRS scale: 4/10 Pain location: Left knee. Pain description: sore Aggravating factors: Movement of left knee. Relieving factors: Nothing.     OBJECTIVE:     TODAY'S TREATMENT:                                     EXERCISE LOG 09/15/21  Exercise Repetitions and Resistance Comments  Stationary bike L2, seat 7- 1x 15 min   Rockerboard    Wall sits with ball at knees 30 degrees 4x10 hold 5 secs    LAQ's  3# 4x10 5 sec hold   Standing HS curls 3x10               Manual:  Sustained left knee flexion in sitting and extension in supine with overpressure stretching x 10 minutes.  ASSESSMENT:  CLINICAL IMPRESSION:     Her brace is now unlocked 0-45 degrees.Rx focused on Increasing ROM and quad activation and strengthening.                    GOALS:  Goals reviewed with patient? Yes  SHORT TERM GOALS: Target date: 09/29/2021  Independent with an initial HEP. Baseline: Goal status:MET  2.  Full left knee extension in supine. Baseline:  Goal status: MET PROM  LONG TERM GOALS: Target date: 10/13/2021   Independent with an advanced HEP. Baseline:  Goal status: On-going  2.  Active left knee flexion to 125 degrees+ so the patient can perform functional tasks and do so with pain not > 2-3/10. Baseline:  Goal status: On-going  3.  Increase left hip and knee strength to a 5/5 to provide good stability for accomplishment of functional activities. Baseline:  Goal status: On-going  4.  Perform a reciprocating stair gait with one railing with pain not > 2/10. Baseline:  Goal status: On-going  5.  Patient walk a community distance without assistive device. Baseline:  Goal status: MET  6.  Return to PLOF. Baseline:  Goal status: On-going   PLAN: PT FREQUENCY:  2-3 times a week for 4 weeks.  PT DURATION: 4 weeks  PLANNED INTERVENTIONS: Therapeutic exercises, Therapeutic activity, Neuromuscular re-education,  Balance training, Gait training, Patient/Family education, Stair training, Electrical stimulation, Cryotherapy, and Ultrasound  PLAN FOR NEXT SESSION:  Recumbent bike progression, progress per protocol, work on improving range of motion, rockerboard, work on quad activation (ie:  wall slides).        Skyeler Scalese,CHRIS, PTA 09/15/2021, 5:17 PM

## 2021-09-20 ENCOUNTER — Ambulatory Visit: Payer: Managed Care, Other (non HMO) | Admitting: *Deleted

## 2021-09-20 ENCOUNTER — Encounter: Payer: Self-pay | Admitting: *Deleted

## 2021-09-20 DIAGNOSIS — R600 Localized edema: Secondary | ICD-10-CM

## 2021-09-20 DIAGNOSIS — M25662 Stiffness of left knee, not elsewhere classified: Secondary | ICD-10-CM

## 2021-09-20 DIAGNOSIS — M25562 Pain in left knee: Secondary | ICD-10-CM | POA: Diagnosis not present

## 2021-09-20 DIAGNOSIS — M6281 Muscle weakness (generalized): Secondary | ICD-10-CM

## 2021-09-20 NOTE — Therapy (Signed)
OUTPATIENT PHYSICAL THERAPY LOWER EXTREMITY TREATMENT   Patient Name: Tonya Franklin MRN: [id number] DOB:1995-09-22, 26 y.o., female Today's Date: 09/20/2021   PT End of Session - 09/20/21 1421     Visit Number 15    Number of Visits 18    Date for PT Re-Evaluation 10/05/21    PT Start Time 9518    PT Stop Time 8416    PT Time Calculation (min) 50 min               Past Medical History:  Diagnosis Date   Asthma    Bipolar disorder ([place])    BV (bacterial vaginosis) 05/31/2015   Depression with anxiety 10/10/2012   Hypertension 05/28/2018   IUD (intrauterine device) in place 10/23/2016   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   02/08/15 [facility] [place] LGSIL/mild dysplasia, HPV testing not done- never went for colpo, wants to f/u here     Colpo:____    Mood swings 2014   Obesity (BMI 30-39.9) 06/29/2017   Obsessive-compulsive disorder 06/29/2017   Right leg swelling 03/22/2015   Past Surgical History:  Procedure Laterality Date   adnoids     CYST EXCISION     cyst removed from face   Patient Active Problem List   Diagnosis Date Noted   Opioid abuse ([place]) 03/29/2021   Depression, major, single episode, moderate ([place]) 06/10/2020   GAD (generalized anxiety disorder) 06/10/2020   Hypertension 05/28/2018   Obsessive-compulsive disorder 06/29/2017   Alcohol abuse 06/29/2017   Overweight (BMI 25.0-29.9) 06/29/2017   IUD (intrauterine device) in place 10/23/2016   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  THERAPY DIAG:  Acute pain of left knee  Localized edema  Muscle weakness (generalized)  Stiffness of left knee, not elsewhere classified  Rationale for Evaluation and Treatment Rehabilitation  ONSET DATE: DOI:  06/22/21.  07/26/21 (surgery date).  SUBJECTIVE:   SUBJECTIVE STATEMENT:    Doing exs at home    PERTINENT  HISTORY: OCD.  PAIN:  Are you having pain? Yes: NPRS scale: 4/10 Pain location: Left knee. Pain description: sore Aggravating factors: Movement of left knee. Relieving factors: Nothing.     OBJECTIVE:     TODAY'S TREATMENT:                                     EXERCISE LOG 09/20/21    8 weeks post-op 09-20-21  Exercise Repetitions and Resistance Comments  Stationary bike L3, seat 3- 1x 10 min   TM retro walking X 5 mins 1 MPH with focus on quad activation   Rockerboard    Wall sits with ball at knees    Knee ext machine  10# isometrics at 90 degrees 3x10 hold 5 secs then 2x10 with both LEs full ROM   Standing HS curls    SAQ 4# 3x10 hold 5 secs   14in box lunge LT LE X15 with focus on ROM and quad control       Manual:  passive ext stretching with QS  ASSESSMENT:  CLINICAL IMPRESSION:     Her brace is now unlocked 0-45 degrees.Rx focused on Increasing ROM and quad activation and strengthening. Pt able to perform new exercises today and very fatigued end of session.  GOALS:  Goals reviewed with patient? Yes  SHORT TERM GOALS: Target date: 10/04/2021  Independent with an initial HEP. Baseline: Goal status:MET  2.  Full left knee extension in supine. Baseline:  Goal status: MET PROM   LONG TERM GOALS: Target date: 10/18/2021   Independent with an advanced HEP. Baseline:  Goal status: On-going  2.  Active left knee flexion to 125 degrees+ so the patient can perform functional tasks and do so with pain not > 2-3/10. Baseline:  Goal status: On-going  3.  Increase left hip and knee strength to a 5/5 to provide good stability for accomplishment of functional activities. Baseline:  Goal status: On-going  4.  Perform a reciprocating stair gait with one railing with pain not > 2/10. Baseline:  Goal status: On-going  5.  Patient walk a community distance without assistive device. Baseline:  Goal status: MET  6.  Return to PLOF. Baseline:   Goal status: On-going   PLAN: PT FREQUENCY:  2-3 times a week for 4 weeks.  PT DURATION: 4 weeks  PLANNED INTERVENTIONS: Therapeutic exercises, Therapeutic activity, Neuromuscular re-education, Balance training, Gait training, Patient/Family education, Stair training, Electrical stimulation, Cryotherapy, and Ultrasound  PLAN FOR NEXT SESSION:  Recumbent bike progression, progress per protocol, work on improving range of motion, rockerboard, work on quad activation (ie:  wall slides).        Kateryn Marasigan,CHRIS, PTA 09/20/2021, 3:57 PM

## 2021-09-21 ENCOUNTER — Other Ambulatory Visit: Payer: Self-pay | Admitting: Family

## 2021-09-21 ENCOUNTER — Encounter: Payer: Managed Care, Other (non HMO) | Admitting: *Deleted

## 2021-09-21 DIAGNOSIS — F410 Panic disorder [episodic paroxysmal anxiety] without agoraphobia: Secondary | ICD-10-CM

## 2021-09-21 DIAGNOSIS — F418 Other specified anxiety disorders: Secondary | ICD-10-CM

## 2021-09-22 ENCOUNTER — Ambulatory Visit: Payer: Managed Care, Other (non HMO) | Admitting: *Deleted

## 2021-09-22 ENCOUNTER — Encounter: Payer: Self-pay | Admitting: *Deleted

## 2021-09-22 DIAGNOSIS — R600 Localized edema: Secondary | ICD-10-CM

## 2021-09-22 DIAGNOSIS — M6281 Muscle weakness (generalized): Secondary | ICD-10-CM

## 2021-09-22 DIAGNOSIS — M25562 Pain in left knee: Secondary | ICD-10-CM | POA: Diagnosis not present

## 2021-09-22 DIAGNOSIS — M25662 Stiffness of left knee, not elsewhere classified: Secondary | ICD-10-CM

## 2021-09-22 NOTE — Therapy (Signed)
OUTPATIENT PHYSICAL THERAPY LOWER EXTREMITY TREATMENT   Patient Name: Tonya Franklin MRN: [id number] DOB:[birth year], 26 y.o., female Today's Date: 09/22/2021   PT End of Session - 09/22/21 1405     Visit Number 16    Number of Visits 18    Date for PT Re-Evaluation 10/05/21    PT Start Time 1950    PT Stop Time 1435    PT Time Calculation (min) 47 min               Past Medical History:  Diagnosis Date   Asthma    Bipolar disorder ([place])    BV (bacterial vaginosis) 05/31/2015   Depression with anxiety 10/10/2012   Hypertension 05/28/2018   IUD (intrauterine device) in place 10/23/2016   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   02/08/15 [facility] [place] LGSIL/mild dysplasia, HPV testing not done- never went for colpo, wants to f/u here     Colpo:____    Mood swings 2014   Obesity (BMI 30-39.9) 06/29/2017   Obsessive-compulsive disorder 06/29/2017   Right leg swelling 03/22/2015   Past Surgical History:  Procedure Laterality Date   adnoids     CYST EXCISION     cyst removed from face   Patient Active Problem List   Diagnosis Date Noted   Opioid abuse ([place]) 03/29/2021   Depression, major, single episode, moderate ([place]) 06/10/2020   GAD (generalized anxiety disorder) 06/10/2020   Hypertension 05/28/2018   Obsessive-compulsive disorder 06/29/2017   Alcohol abuse 06/29/2017   Overweight (BMI 25.0-29.9) 06/29/2017   IUD (intrauterine device) in place 10/23/2016   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  THERAPY DIAG:  Acute pain of left knee  Localized edema  Muscle weakness (generalized)  Stiffness of left knee, not elsewhere classified  Rationale for Evaluation and Treatment Rehabilitation  ONSET DATE: DOI:  06/22/21.  07/26/21 (surgery date).  SUBJECTIVE:   SUBJECTIVE STATEMENT:    Doing okay today with knee pain. Not feeling  great    PERTINENT HISTORY: OCD.  PAIN:  Are you having pain? Yes: NPRS scale: 4/10 Pain location: Left knee. Pain description: sore Aggravating factors: Movement of left knee. Relieving factors: Nothing.     OBJECTIVE:     TODAY'S TREATMENT:                                     EXERCISE LOG 09/22/21               8 weeks post-op 09-20-21  Exercise Repetitions and Resistance Comments  Stationary bike    Nustep  x 10 mins L3 LEs only   TM retro walking X 7 mins 1.1 MPH with focus on quad activation   Rockerboard    Wall sits with ball at knees    Knee ext machine  10# isometrics at 90 degrees 3x10 hold 10 secs then 3x10 with both LEs full ROM   Seated HS curl 30# 3x10   SAQ 4# 3x10 hold 5 secs   14in box lunge LT LE X15 with focus on ROM and quad control   Mini squat x10   Manual:  ASSESSMENT:  CLINICAL IMPRESSION:   brace is now unlocked 0-45 degrees.Rx focused on Increasing ROM and quad activation and strengthening again.    Did well with HS curl machine today  as well as  mini squats.  120 degrees PROM today                  GOALS:  Goals reviewed with patient? Yes  SHORT TERM GOALS: Target date: 10/06/2021  Independent with an initial HEP. Baseline: Goal status:MET  2.  Full left knee extension in supine. Baseline:  Goal status: MET PROM   LONG TERM GOALS: Target date: 10/20/2021   Independent with an advanced HEP. Baseline:  Goal status: On-going  2.  Active left knee flexion to 125 degrees+ so the patient can perform functional tasks and do so with pain not > 2-3/10. Baseline:  Goal status: On-going  3.  Increase left hip and knee strength to a 5/5 to provide good stability for accomplishment of functional activities. Baseline:  Goal status: On-going  4.  Perform a reciprocating stair gait with one railing with pain not > 2/10. Baseline:  Goal status: On-going  5.  Patient walk a community distance without assistive device. Baseline:  Goal  status: MET  6.  Return to PLOF. Baseline:  Goal status: On-going   PLAN: PT FREQUENCY:  2-3 times a week for 4 weeks.  PT DURATION: 4 weeks  PLANNED INTERVENTIONS: Therapeutic exercises, Therapeutic activity, Neuromuscular re-education, Balance training, Gait training, Patient/Family education, Stair training, Electrical stimulation, Cryotherapy, and Ultrasound  PLAN FOR NEXT SESSION:  Recumbent bike progression, progress per protocol, work on improving range of motion, rockerboard, work on quad activation (ie:  wall slides).        Taryll Reichenberger,CHRIS, PTA 09/22/2021, 6:26 PM

## 2021-09-23 ENCOUNTER — Ambulatory Visit: Payer: Managed Care, Other (non HMO) | Admitting: Physical Therapy

## 2021-09-23 ENCOUNTER — Ambulatory Visit (INDEPENDENT_AMBULATORY_CARE_PROVIDER_SITE_OTHER): Payer: Self-pay | Admitting: Family Medicine

## 2021-09-23 ENCOUNTER — Encounter: Payer: Self-pay | Admitting: Physical Therapy

## 2021-09-23 ENCOUNTER — Encounter: Payer: Self-pay | Admitting: Family Medicine

## 2021-09-23 DIAGNOSIS — M25662 Stiffness of left knee, not elsewhere classified: Secondary | ICD-10-CM

## 2021-09-23 DIAGNOSIS — M25562 Pain in left knee: Secondary | ICD-10-CM

## 2021-09-23 DIAGNOSIS — H9202 Otalgia, left ear: Secondary | ICD-10-CM

## 2021-09-23 DIAGNOSIS — M6281 Muscle weakness (generalized): Secondary | ICD-10-CM

## 2021-09-23 DIAGNOSIS — R600 Localized edema: Secondary | ICD-10-CM

## 2021-09-23 MED ORDER — FLUTICASONE PROPIONATE 50 MCG/ACT NA SUSP: 2.0000 | Freq: Every day | NASAL | 6 refills | Status: DC

## 2021-09-23 NOTE — Progress Notes (Signed)
Virtual Visit via telephone Note Due to COVID-19 pandemic this visit was conducted virtually. This visit type was conducted due to national recommendations for restrictions regarding the COVID-19 Pandemic (e.g. social distancing, sheltering in place) in an effort to limit this patient's exposure and mitigate transmission in our community. All issues noted in this document were discussed and addressed.  A physical exam was not performed with this format.   I connected with Tonya Franklin on 09/23/2021 at 1345 by telephone and verified that I am speaking with the correct person using two identifiers. Tonya Franklin is currently located at [facility] and mother is currently with them during visit. The provider, Kari Baars, FNP is located in their office at time of visit.  I discussed the limitations, risks, security and privacy concerns of performing an evaluation and management service by virtual visit and the availability of in person appointments. I also discussed with the patient that there may be a patient responsible charge related to this service. The patient expressed understanding and agreed to proceed.  Subjective:  Patient ID: Tonya Franklin, female    DOB: Jun 02, 1995, 26 y.o.   MRN: [id number]  Chief Complaint:  Ear Pain   HPI: Tonya Franklin is a 26 y.o. female presenting on 09/23/2021 for Ear Pain   Otalgia  There is pain in the left ear. This is a new problem. The current episode started today. The problem occurs constantly. The problem has been unchanged. There has been no fever. The pain is moderate. Associated symptoms include a sore throat. Pertinent negatives include no abdominal pain, coughing, diarrhea, ear discharge, headaches, hearing loss, neck pain, rash, rhinorrhea or vomiting. She has tried nothing for the symptoms. The treatment provided no relief.     Relevant past medical, surgical, family, and social history reviewed and updated as indicated.   Allergies and medications reviewed and updated.   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   02/08/15 [facility] [place] LGSIL/mild dysplasia, HPV testing not done- never went for colpo, wants to f/u here     Colpo:____    Mood swings 2014   Obesity (BMI 30-39.9) 06/29/2017   Obsessive-compulsive disorder 06/29/2017   Right leg swelling 03/22/2015    Past Surgical History:  Procedure Laterality Date   adnoids     CYST EXCISION     cyst removed from face    Social History   Socioeconomic History   Marital status: Single    Spouse name: Not on file   Number of children: Not on file   Years of education: Not on file   Highest education level: Not on file  Occupational History   Not on file  Tobacco Use   Smoking status: Some Days    Types: Cigarettes    Last attempt to quit: 03/19/2014    Years since quitting: 7.5   Smokeless tobacco: Never  Vaping Use   Vaping Use: Never used  Substance and Sexual Activity   Alcohol use: No    Comment: occ   Drug use: No    Comment: occ   Sexual activity: Yes    Birth control/protection: Condom  Other Topics Concern   Not on file  Social History Narrative   Not on  file   Social Determinants of Health   Financial Resource Strain: Not on file  Food Insecurity: Not on file  Transportation Needs: Not on file  Physical Activity: Not on file  Stress: Not on file  Social Connections: Not on file  Intimate Partner Violence: Not on file    Outpatient Encounter Medications as of 09/23/2021  Medication Sig   fluticasone (FLONASE) 50 MCG/ACT nasal spray Place 2 sprays into both nostrils daily.   albuterol (VENTOLIN HFA) 108 (90 Base) MCG/ACT inhaler TAKE 2 PUFFS BY MOUTH EVERY 6 HOURS AS NEEDED FOR WHEEZE  OR SHORTNESS OF BREATH   FLUoxetine (PROZAC) 20 MG tablet Take 3 tablets (60 mg total) by mouth daily.   hydrOXYzine (VISTARIL) 25 MG capsule TAKE 1 CAPSULE BY MOUTH THREE TIMES A DAY AS NEEDED   levonorgestrel (MIRENA) 20 MCG/24HR IUD by Intrauterine route.   lisinopril (ZESTRIL) 20 MG tablet TAKE 1 TABLET BY MOUTH EVERY DAY   [DISCONTINUED] cyanocobalamin (,VITAMIN B-12,) 1000 MCG/ML injection 1000 mcg [place]/IM every day for one week, then 1000 mcg once every week for one month. Then 1000 mcg monthly. (Patient not taking: Reported on 09/08/2021)   [DISCONTINUED] EPINEPHrine 0.3 mg/0.3 mL IJ SOAJ injection Inject 0.3 mLs (0.3 mg total) into the muscle as needed for anaphylaxis. (Patient not taking: Reported on 09/08/2021)   No facility-administered encounter medications on file as of 09/23/2021.    Allergies  Allergen Reactions   Cefuroxime Axetil Other (See Comments)    Unknown    Review of Systems  Constitutional:  Negative for activity change, appetite change, chills, diaphoresis, fatigue, fever and unexpected weight change.  HENT:  Positive for congestion, ear pain and sore throat. Negative for dental problem, drooling, ear discharge, facial swelling, hearing loss, mouth sores, nosebleeds, postnasal drip, rhinorrhea, sinus pressure, sinus pain, sneezing, tinnitus, trouble swallowing and voice change.   Respiratory:  Negative for cough and shortness of breath.   Cardiovascular:  Negative for chest pain, palpitations and leg swelling.  Gastrointestinal:  Negative for abdominal distention, abdominal pain, diarrhea and vomiting.  Genitourinary:  Negative for decreased urine volume and difficulty urinating.  Musculoskeletal:  Negative for neck pain.  Skin:  Negative for rash.  Neurological:  Negative for weakness and headaches.  Psychiatric/Behavioral:  Negative for confusion.   All other systems reviewed and are negative.        Observations/Objective: No vital signs or physical exam,  this was a virtual health encounter.  Pt alert and oriented, answers all questions appropriately, and able to speak in full sentences.    Assessment and Plan: Tonya Franklin was seen today for ear pain.  Diagnoses and all orders for this visit:  Acute otalgia, left Pt woke up with left ear pain, sore throat, and congestion. Denies fever, ear drainage, other symptoms. Requesting antibiotic therapy. Discussed in detail indications of viral vs bacterial infections and need for antibiotic therapy. Discussed symptomatic care in detail. Aware if symptoms progress, worsen, or persist, to call the office.  -     fluticasone (FLONASE) 50 MCG/ACT nasal spray; Place 2 sprays into both nostrils daily.     Follow Up Instructions: Return if symptoms worsen or fail to improve.    I discussed the assessment and treatment plan with the patient. The patient was provided an opportunity to ask questions and all were answered. The patient agreed with the plan and demonstrated an understanding of the instructions.   The patient was advised to call back or seek an in-person evaluation  if the symptoms worsen or if the condition fails to improve as anticipated.  The above assessment and management plan was discussed with the patient. The patient verbalized understanding of and has agreed to the management plan. Patient is aware to call the clinic if they develop any new symptoms or if symptoms persist or worsen. Patient is aware when to return to the clinic for a follow-up visit. Patient educated on when it is appropriate to go to the emergency department.    I provided 13 minutes of time during this telephone encounter.   Kari Baars, FNP-C [facility] [address] [place], [place] [address] [phone] 09/23/2021

## 2021-09-23 NOTE — Therapy (Addendum)
OUTPATIENT PHYSICAL THERAPY LOWER EXTREMITY TREATMENT   Patient Name: SHIARA MCGOUGH MRN: [id number] DOB:06/03/1995, 26 y.o., female Today's Date: 09/23/2021   PT End of Session - 09/23/21 1107     Visit Number 17    Number of Visits 18    Date for PT Re-Evaluation 10/05/21    PT Start Time 0957    PT Stop Time 1038    PT Time Calculation (min) 41 min    Activity Tolerance Patient tolerated treatment well    Behavior During Therapy [facility] for tasks assessed/performed                Past Medical History:  Diagnosis Date   Asthma    Bipolar disorder ([place])    BV (bacterial vaginosis) 05/31/2015   Depression with anxiety 10/10/2012   Hypertension 05/28/2018   IUD (intrauterine device) in place 10/23/2016   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   02/08/15 [facility] [place] LGSIL/mild dysplasia, HPV testing not done- never went for colpo, wants to f/u here     Colpo:____    Mood swings 2014   Obesity (BMI 30-39.9) 06/29/2017   Obsessive-compulsive disorder 06/29/2017   Right leg swelling 03/22/2015   Past Surgical History:  Procedure Laterality Date   adnoids     CYST EXCISION     cyst removed from face   Patient Active Problem List   Diagnosis Date Noted   Opioid abuse ([place]) 03/29/2021   Depression, major, single episode, moderate ([place]) 06/10/2020   GAD (generalized anxiety disorder) 06/10/2020   Hypertension 05/28/2018   Obsessive-compulsive disorder 06/29/2017   Alcohol abuse 06/29/2017   Overweight (BMI 25.0-29.9) 06/29/2017   IUD (intrauterine device) in place 10/23/2016   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  THERAPY DIAG:  Acute pain of left knee  Localized edema  Muscle weakness (generalized)  Stiffness of left knee, not elsewhere classified  Rationale for Evaluation and Treatment Rehabilitation  ONSET DATE: DOI:  06/22/21.   07/26/21 (surgery date).  SUBJECTIVE:   SUBJECTIVE STATEMENT:   No new complaints.  PERTINENT HISTORY: OCD.  PAIN:  Are you having pain? Yes: NPRS scale: 4/10 Pain location: Left knee. Pain description: sore Aggravating factors: Movement of left knee. Relieving factors: Nothing.     OBJECTIVE:     TODAY'S TREATMENT:                                     EXERCISE LOG 09/22/21               8 weeks post-op 09-20-21  Exercise Repetitions and Resistance Comments  Recumbent bike 10 minutes at level 4.                                       Manual: Prone hang with 2# x 10 minutes while receiving STW/M (10 minutes) to her left hamstrings f/b knee ext 10# x 3 minutes, 30# ham curls x 3 minutes (all perform with excellent technique within protocol guidelines).  ASSESSMENT:  CLINICAL IMPRESSION:   Patient states her knee felt great attributing this to STW/M to her hamstrings.  Recommended she begin the prone hang at home with 2#.  GOALS:  Goals reviewed with patient? Yes  SHORT TERM GOALS: Target date: 10/07/2021  Independent with an initial HEP. Baseline: Goal status:MET  2.  Full left knee extension in supine. Baseline:  Goal status: MET PROM   LONG TERM GOALS: Target date: 10/21/2021   Independent with an advanced HEP. Baseline:  Goal status: On-going  2.  Active left knee flexion to 125 degrees+ so the patient can perform functional tasks and do so with pain not > 2-3/10. Baseline:  Goal status: On-going  3.  Increase left hip and knee strength to a 5/5 to provide good stability for accomplishment of functional activities. Baseline:  Goal status: On-going  4.  Perform a reciprocating stair gait with one railing with pain not > 2/10. Baseline:  Goal status: On-going  5.  Patient walk a community distance without assistive device. Baseline:  Goal status: MET  6.  Return to PLOF. Baseline:  Goal status: On-going   PLAN: PT FREQUENCY:   2-3 times a week for 4 weeks.  PT DURATION: 4 weeks  PLANNED INTERVENTIONS: Therapeutic exercises, Therapeutic activity, Neuromuscular re-education, Balance training, Gait training, Patient/Family education, Stair training, Electrical stimulation, Cryotherapy, and Ultrasound  PLAN FOR NEXT SESSION:  Recumbent bike progression, progress per protocol, work on improving range of motion, rockerboard, work on quad activation (ie:  wall slides).        Tiandra Swoveland, [place], PT 09/23/2021, 11:15 AM

## 2021-09-28 ENCOUNTER — Ambulatory Visit: Payer: Managed Care, Other (non HMO) | Admitting: Physical Therapy

## 2021-09-29 ENCOUNTER — Ambulatory Visit: Payer: Managed Care, Other (non HMO) | Admitting: *Deleted

## 2021-09-29 ENCOUNTER — Encounter: Payer: Self-pay | Admitting: *Deleted

## 2021-09-29 DIAGNOSIS — M6281 Muscle weakness (generalized): Secondary | ICD-10-CM

## 2021-09-29 DIAGNOSIS — M25662 Stiffness of left knee, not elsewhere classified: Secondary | ICD-10-CM

## 2021-09-29 DIAGNOSIS — M25562 Pain in left knee: Secondary | ICD-10-CM

## 2021-09-29 DIAGNOSIS — R600 Localized edema: Secondary | ICD-10-CM

## 2021-09-29 NOTE — Therapy (Signed)
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   02/08/15 [facility] [place] LGSIL/mild dysplasia, HPV testing not done- never went for colpo, wants to f/u here     Colpo:____    Mood swings 2014   Obesity (BMI 30-39.9) 06/29/2017   Obsessive-compulsive disorder 06/29/2017   Right leg swelling 03/22/2015   Past Surgical History:  Procedure Laterality Date   adnoids     CYST EXCISION     cyst removed from face   Patient Active Problem List   Diagnosis Date Noted   Opioid abuse ([place]) 03/29/2021   Depression, major, single episode, moderate ([place]) 06/10/2020   GAD (generalized anxiety disorder) 06/10/2020   Hypertension 05/28/2018   Obsessive-compulsive disorder 06/29/2017   Alcohol abuse 06/29/2017   Overweight (BMI 25.0-29.9) 06/29/2017   IUD (intrauterine device) in place 10/23/2016   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  THERAPY DIAG:  Acute pain of left knee  Localized edema  Muscle weakness (generalized)  Stiffness of left knee, not elsewhere classified  Rationale for Evaluation and Treatment Rehabilitation  ONSET DATE: DOI:  06/22/21.  07/26/21 (surgery date).  SUBJECTIVE:   SUBJECTIVE STATEMENT:   LT knee still gets some stinging around the  knee cap  PERTINENT HISTORY: OCD.  PAIN:  Are you having pain? Yes: NPRS scale: 4/10 Pain location: Left knee. Pain description: sore Aggravating factors: Movement of left knee. Relieving factors: Nothing.     OBJECTIVE:     TODAY'S TREATMENT:                                     EXERCISE LOG 09/29/21               8 weeks post-op 09-20-21  Exercise Repetitions and Resistance Comments  Recumbent bike 11 minutes at level 4.                                       Manual: Prone hang with 2# x 10 minutes while receiving STW/M (10 minutes) to her left hamstrings f/b knee ext 10# x 3 minutes, 30# ham curls x 3 minutes (all perform with excellent technique within protocol guidelines).  Extension TKE isometrics in prone, HS curl x15 with end range stretching to 118 degrees  ASSESSMENT:  CLINICAL IMPRESSION:   Pt arrived today doing fairly well with LT knee, but still with some tightness and insufficient quad activation with TKE. Rx focused on increased ROM as well as quad  activation and strengthening. PROM for supine flexion 130 degrees, and prone to 120 degrees.  To MD Tomorrow              GOALS:  Goals reviewed with patient? Yes  SHORT TERM GOALS: Target date: 10/13/2021  Independent with an initial HEP. Baseline: Goal status:MET  2.  Full left knee extension in supine. Baseline:  Goal status: MET PROM   LONG TERM GOALS: Target date: 10/27/2021   Independent with an advanced HEP. Baseline:  Goal status: On-going  2.  Active left knee flexion to 125 degrees+ so the patient can perform functional tasks and do so with pain not > 2-3/10. Baseline:  Goal status: MET 09-29-21  3.  Increase left hip and knee strength to a 5/5 to provide good stability for accomplishment of functional activities. Baseline:  Goal status: On-going  4.  Perform a reciprocating stair gait with one railing with pain not > 2/10. Baseline:  Goal status: On-going  5.  Patient walk a  community distance without assistive device. Baseline:  Goal status: MET  6.  Return to PLOF. Baseline:  Goal status: On-going   PLAN: PT FREQUENCY:  2-3 times a week for 4 weeks.  PT DURATION: 4 weeks  PLANNED INTERVENTIONS: Therapeutic exercises, Therapeutic activity, Neuromuscular re-education, Balance training, Gait training, Patient/Family education, Stair training, Electrical stimulation, Cryotherapy, and Ultrasound  PLAN FOR NEXT SESSION:  Recumbent bike progression, progress per protocol, work on improving range of motion, rockerboard, work on quad activation (ie:  wall slides).        Gaurav Baldree,CHRIS, PTA 09/29/2021, 1:03 PM

## 2021-09-30 ENCOUNTER — Encounter: Payer: Managed Care, Other (non HMO) | Admitting: *Deleted

## 2021-10-04 ENCOUNTER — Encounter: Payer: Self-pay | Admitting: *Deleted

## 2021-10-04 ENCOUNTER — Ambulatory Visit: Payer: No Typology Code available for payment source | Attending: Specialist | Admitting: *Deleted

## 2021-10-04 DIAGNOSIS — M25562 Pain in left knee: Secondary | ICD-10-CM | POA: Insufficient documentation

## 2021-10-04 DIAGNOSIS — M25662 Stiffness of left knee, not elsewhere classified: Secondary | ICD-10-CM | POA: Insufficient documentation

## 2021-10-04 DIAGNOSIS — M6281 Muscle weakness (generalized): Secondary | ICD-10-CM | POA: Insufficient documentation

## 2021-10-04 DIAGNOSIS — R600 Localized edema: Secondary | ICD-10-CM | POA: Insufficient documentation

## 2021-10-04 NOTE — Therapy (Signed)
OUTPATIENT PHYSICAL THERAPY LOWER EXTREMITY TREATMENT   Patient Name: Tonya Franklin MRN: [id number] DOB:1995/07/17, 26 y.o., female Today's Date: 10/04/2021   PT End of Session - 10/04/21 0836     Visit Number 19    Number of Visits 19    Date for PT Re-Evaluation 10/05/21    PT Start Time 0817    PT Stop Time 0905    PT Time Calculation (min) 48 min                Past Medical History:  Diagnosis Date   Asthma    Bipolar disorder ([place])    BV (bacterial vaginosis) 05/31/2015   Depression with anxiety 10/10/2012   Hypertension 05/28/2018   IUD (intrauterine device) in place 10/23/2016   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   02/08/15 [facility] [place] LGSIL/mild dysplasia, HPV testing not done- never went for colpo, wants to f/u here     Colpo:____    Mood swings 2014   Obesity (BMI 30-39.9) 06/29/2017   Obsessive-compulsive disorder 06/29/2017   Right leg swelling 03/22/2015   Past Surgical History:  Procedure Laterality Date   adnoids     CYST EXCISION     cyst removed from face   Patient Active Problem List   Diagnosis Date Noted   Opioid abuse ([place]) 03/29/2021   Depression, major, single episode, moderate ([place]) 06/10/2020   GAD (generalized anxiety disorder) 06/10/2020   Hypertension 05/28/2018   Obsessive-compulsive disorder 06/29/2017   Alcohol abuse 06/29/2017   Overweight (BMI 25.0-29.9) 06/29/2017   IUD (intrauterine device) in place 10/23/2016   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  THERAPY DIAG:  Acute pain of left knee  Localized edema  Muscle weakness (generalized)  Stiffness of left knee, not elsewhere classified  Rationale for Evaluation and Treatment Rehabilitation  ONSET DATE: DOI:  06/22/21.  07/26/21 (surgery date).  SUBJECTIVE:   SUBJECTIVE STATEMENT:   Doing okay. Want to work on walking  better  PERTINENT HISTORY: OCD.  PAIN:  Are you having pain? Yes: NPRS scale: 3-4/10 Pain location: Left knee. Pain description: sore Aggravating factors: Movement of left knee. Relieving factors: Nothing.     OBJECTIVE:     TODAY'S TREATMENT:                                     EXERCISE LOG 10-04-21               9 weeks post-op 09-27-21   OPEN brace to 90 degrees  Exercise Repetitions and Resistance Comments  Recumbent bike 10 minutes at level 4.   TM Retro walking x 10 mins ramp 5, 1.2 MPH   Prone hang X5 mins 2# wt with quad set    Prone HS curl 2# x20 focus on control   Standing at door toe ups and quad sets  x 10    4in step down LT 3x10 UE assist as needed   Gait Heel toe gait pattern focus               Manual:    Extension TKE isometrics in pronex 20 hold 5 secs  and resisted extension full range in prone ASSESSMENT:  CLINICAL IMPRESSION:   Pt arrived today doing fairly well with LT knee, but still with quad TKE deficit.  Rx focused on mm activation exs still as well as gait and quad strengthening. NOtable increased quad control and ROM with improved gait end of session.                 GOALS:    SHORT TERM GOALS: Target date: 10/18/2021  Independent with an initial HEP. Baseline: Goal status:MET  2.  Full left knee extension in supine. Baseline:  Goal status: MET PROM   LONG TERM GOALS: Target date: 11/01/2021   Independent with an advanced HEP. Baseline:  Goal status: On-going  2.  Active left knee flexion to 125 degrees+ so the patient can perform functional tasks and do so with pain not > 2-3/10. Baseline:  Goal status: MET 09-29-21  3.  Increase left hip and knee strength to a 5/5 to provide good stability for accomplishment of functional activities. Baseline:  Goal status: On-going  4.  Perform a reciprocating stair gait with one railing with pain not > 2/10. Baseline:  Goal status: On-going  5.  Patient walk a community distance  without assistive device. Baseline:  Goal status: MET  6.  Return to PLOF. Baseline:  Goal status: On-going   PLAN: PT FREQUENCY:  2-3 times a week for 4 weeks.  PT DURATION: 4 weeks  PLANNED INTERVENTIONS: Therapeutic exercises, Therapeutic activity, Neuromuscular re-education, Balance training, Gait training, Patient/Family education, Stair training, Electrical stimulation, Cryotherapy, and Ultrasound  PLAN FOR NEXT SESSION:  Recumbent bike progression, progress per protocol, work on improving range of motion,quad activation and Gait        Deward Sebek,CHRIS, PTA 10/04/2021, 9:24 AM

## 2021-10-05 ENCOUNTER — Encounter: Payer: Managed Care, Other (non HMO) | Admitting: *Deleted

## 2021-10-06 ENCOUNTER — Encounter: Payer: Managed Care, Other (non HMO) | Admitting: *Deleted

## 2021-10-11 ENCOUNTER — Ambulatory Visit: Payer: No Typology Code available for payment source | Admitting: *Deleted

## 2021-10-11 ENCOUNTER — Encounter: Payer: Self-pay | Admitting: *Deleted

## 2021-10-11 DIAGNOSIS — M25562 Pain in left knee: Secondary | ICD-10-CM | POA: Diagnosis not present

## 2021-10-11 DIAGNOSIS — M6281 Muscle weakness (generalized): Secondary | ICD-10-CM

## 2021-10-11 DIAGNOSIS — R600 Localized edema: Secondary | ICD-10-CM

## 2021-10-11 DIAGNOSIS — M25662 Stiffness of left knee, not elsewhere classified: Secondary | ICD-10-CM

## 2021-10-11 NOTE — Therapy (Signed)
OUTPATIENT PHYSICAL THERAPY LOWER EXTREMITY TREATMENT   Patient Name: Tonya Franklin MRN: [id number] DOB:[birth year], 26 y.o., female Today's Date: 10/11/2021   PT End of Session - 10/11/21 0823     Visit Number 20    Number of Visits 20    PT Start Time 0815    PT Stop Time 0905    PT Time Calculation (min) 50 min                Past Medical History:  Diagnosis Date   Asthma    Bipolar disorder ([place])    BV (bacterial vaginosis) 05/31/2015   Depression with anxiety 10/10/2012   Hypertension 05/28/2018   IUD (intrauterine device) in place 10/23/2016   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   02/08/15 [facility] LGSIL/mild dysplasia, HPV testing not done- never went for colpo, wants to f/u here     Colpo:____    Mood swings 2014   Obesity (BMI 30-39.9) 06/29/2017   Obsessive-compulsive disorder 06/29/2017   Right leg swelling 03/22/2015   Past Surgical History:  Procedure Laterality Date   adnoids     CYST EXCISION     cyst removed from face   Patient Active Problem List   Diagnosis Date Noted   Opioid abuse ([place]) 03/29/2021   Depression, major, single episode, moderate ([place]) 06/10/2020   GAD (generalized anxiety disorder) 06/10/2020   Hypertension 05/28/2018   Obsessive-compulsive disorder 06/29/2017   Alcohol abuse 06/29/2017   Overweight (BMI 25.0-29.9) 06/29/2017   IUD (intrauterine device) in place 10/23/2016   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  THERAPY DIAG:  Acute pain of left knee  Localized edema  Muscle weakness (generalized)  Stiffness of left knee, not elsewhere classified  Rationale for Evaluation and Treatment Rehabilitation  ONSET DATE: DOI:  06/22/21.  07/26/21 (surgery date).  SUBJECTIVE:   SUBJECTIVE STATEMENT:   Doing okay. Want to work on walking better  PERTINENT HISTORY: OCD.  PAIN:  Are  you having pain? Yes: NPRS scale: 3-4/10 Pain location: Left knee. Pain description: sore Aggravating factors: Movement of left knee. Relieving factors: Nothing.     OBJECTIVE:     TODAY'S TREATMENT:                                     EXERCISE LOG 10-11-21               11 weeks post-op 10-11-21   OPEN brace to 90 degrees  Exercise Repetitions and Resistance Comments  Recumbent bike 10 minutes at level 4.   TM Retro walking x 10 mins ramp 5, 1.2 MPH   Prone hang X5 mins 2# wt with quad set    Standing  HS curl 2# x20 focus on control   Standing at door toe ups and quad sets  x 10    4in step down LT 3x10 UE assist as needed   14in box lunge 3x10 focus on quad control   Leg press  2pl 2xfatigue           Manual:    Extension TKE isometrics in pronex 20 hold 5 secs  and resisted extension full range in prone. Full flexion isometrics    ASSESSMENT:  CLINICAL IMPRESSION:   Pt arrived today doing fairly well with LT knee. Rx focused  on ROM and quad control and strengthening at end-ranges. Quad control improving.                GOALS:    SHORT TERM GOALS: Target date: 10/25/2021  Independent with an initial HEP. Baseline: Goal status:MET  2.  Full left knee extension in supine. Baseline:  Goal status: MET PROM   LONG TERM GOALS: Target date: 11/08/2021   Independent with an advanced HEP. Baseline:  Goal status: On-going  2.  Active left knee flexion to 125 degrees+ so the patient can perform functional tasks and do so with pain not > 2-3/10. Baseline:  Goal status: MET 09-29-21  3.  Increase left hip and knee strength to a 5/5 to provide good stability for accomplishment of functional activities. Baseline:  Goal status: On-going  4.  Perform a reciprocating stair gait with one railing with pain not > 2/10. Baseline:  Goal status: On-going  5.  Patient walk a community distance without assistive device. Baseline:  Goal status: MET  6.  Return to  PLOF. Baseline:  Goal status: On-going   PLAN: PT FREQUENCY:  2-3 times a week for 4 weeks.  PT DURATION: 4 weeks  PLANNED INTERVENTIONS: Therapeutic exercises, Therapeutic activity, Neuromuscular re-education, Balance training, Gait training, Patient/Family education, Stair training, Electrical stimulation, Cryotherapy, and Ultrasound  PLAN FOR NEXT SESSION:  Recumbent bike progression, progress per protocol, work on improving range of motion,quad activation and Gait        Sanaai Doane,CHRIS, PTA 10/11/2021, 9:14 AM

## 2021-10-12 ENCOUNTER — Encounter: Payer: Self-pay | Admitting: Physical Therapy

## 2021-10-12 ENCOUNTER — Ambulatory Visit: Payer: No Typology Code available for payment source | Admitting: Physical Therapy

## 2021-10-12 DIAGNOSIS — M25562 Pain in left knee: Secondary | ICD-10-CM

## 2021-10-12 DIAGNOSIS — M25662 Stiffness of left knee, not elsewhere classified: Secondary | ICD-10-CM

## 2021-10-12 DIAGNOSIS — R600 Localized edema: Secondary | ICD-10-CM

## 2021-10-12 DIAGNOSIS — M6281 Muscle weakness (generalized): Secondary | ICD-10-CM

## 2021-10-12 NOTE — Therapy (Signed)
OUTPATIENT PHYSICAL THERAPY LOWER EXTREMITY TREATMENT   Patient Name: Tonya Franklin MRN: [id number] DOB:[birth year], 26 y.o., female Today's Date: 10/12/2021   PT End of Session - 10/12/21 0820     Visit Number 21    Number of Visits 20    PT Start Time 0817    PT Stop Time 0902    PT Time Calculation (min) 45 min    Activity Tolerance Patient tolerated treatment well    Behavior During Therapy [facility] for tasks assessed/performed                Past Medical History:  Diagnosis Date   Asthma    Bipolar disorder ([place])    BV (bacterial vaginosis) 05/31/2015   Depression with anxiety 10/10/2012   Hypertension 05/28/2018   IUD (intrauterine device) in place 10/23/2016   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   02/08/15 [facility] LGSIL/mild dysplasia, HPV testing not done- never went for colpo, wants to f/u here     Colpo:____    Mood swings 2014   Obesity (BMI 30-39.9) 06/29/2017   Obsessive-compulsive disorder 06/29/2017   Right leg swelling 03/22/2015   Past Surgical History:  Procedure Laterality Date   adnoids     CYST EXCISION     cyst removed from face   Patient Active Problem List   Diagnosis Date Noted   Opioid abuse ([place]) 03/29/2021   Depression, major, single episode, moderate ([place]) 06/10/2020   GAD (generalized anxiety disorder) 06/10/2020   Hypertension 05/28/2018   Obsessive-compulsive disorder 06/29/2017   Alcohol abuse 06/29/2017   Overweight (BMI 25.0-29.9) 06/29/2017   IUD (intrauterine device) in place 10/23/2016   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  THERAPY DIAG:  Acute pain of left knee  Localized edema  Muscle weakness (generalized)  Stiffness of left knee, not elsewhere classified  Rationale for Evaluation and Treatment Rehabilitation  ONSET DATE: DOI:  06/22/21.  07/26/21 (surgery  date).  SUBJECTIVE:   SUBJECTIVE STATEMENT:   Reports that she has been walking more at lunch, doing squats with her standing desk. Can now sit more comfortably with knees flexed.  PERTINENT HISTORY: OCD.  PAIN:  Are you having pain? No OBJECTIVE:   TODAY'S TREATMENT:                                     EXERCISE LOG 10-12-21    Exercise Repetitions and Resistance Comments  Recumbent bike 10 minutes at level 4.   TM Retro walking x 7 mins ramp 2, 1.5 MPH   Backwards walking [employer] walking [employer] XTS   Leg press  3 pl, seat 5 x30 reps   LAQ 5# 10 sec x20 reps         ASSESSMENT:  CLINICAL IMPRESSION:    Patient presented in clinic with reports of no pain in L knee. Patient now reporting that she can sit more comfortably with cross legs and knee flexion. Patient eager to increase quad strengthening and function. Patient demonstrating greater quad tone during ambulation with greater heel strike to toe off emphasis. Brace is already unlocked to 90 deg.   GOALS: SHORT TERM GOALS: Target date: 10/26/2021  Independent with an initial HEP. Baseline: Goal status:MET  2.  Full left knee extension in supine. Baseline:  Goal  status: MET PROM   LONG TERM GOALS: Target date: 11/09/2021   Independent with an advanced HEP. Baseline:  Goal status: On-going  2.  Active left knee flexion to 125 degrees+ so the patient can perform functional tasks and do so with pain not > 2-3/10. Baseline:  Goal status: MET 09-29-21  3.  Increase left hip and knee strength to a 5/5 to provide good stability for accomplishment of functional activities. Baseline:  Goal status: On-going  4.  Perform a reciprocating stair gait with one railing with pain not > 2/10. Baseline:  Goal status: MET  5.  Patient walk a community distance without assistive device. Baseline:  Goal status: MET  6.  Return to PLOF. Baseline:  Goal status: On-going   PLAN: PT FREQUENCY:  2-3 times a  week for 4 weeks.  PT DURATION: 4 weeks  PLANNED INTERVENTIONS: Therapeutic exercises, Therapeutic activity, Neuromuscular re-education, Balance training, Gait training, Patient/Family education, Stair training, Electrical stimulation, Cryotherapy, and Ultrasound  PLAN FOR NEXT SESSION:  Recumbent bike progression, progress per protocol, work on improving range of motion,quad activation and Gait    [employer], PTA 10/12/2021, 9:44 AM

## 2021-10-14 ENCOUNTER — Encounter: Payer: Managed Care, Other (non HMO) | Admitting: *Deleted

## 2021-10-14 NOTE — Addendum Note (Signed)
Addended by: Smriti Barkow, [place] W on: 10/14/2021 01:30 PM   Modules accepted: Orders

## 2021-10-18 IMAGING — DX DG ANKLE COMPLETE 3+V*R*
3 series · 3 of 3 positions shown · non-contrast
Comparison: None.

CLINICAL DATA: Ankle deformity after playing volleyball.

EXAM:
RIGHT ANKLE - COMPLETE 3+ VIEW

[ankle ap]
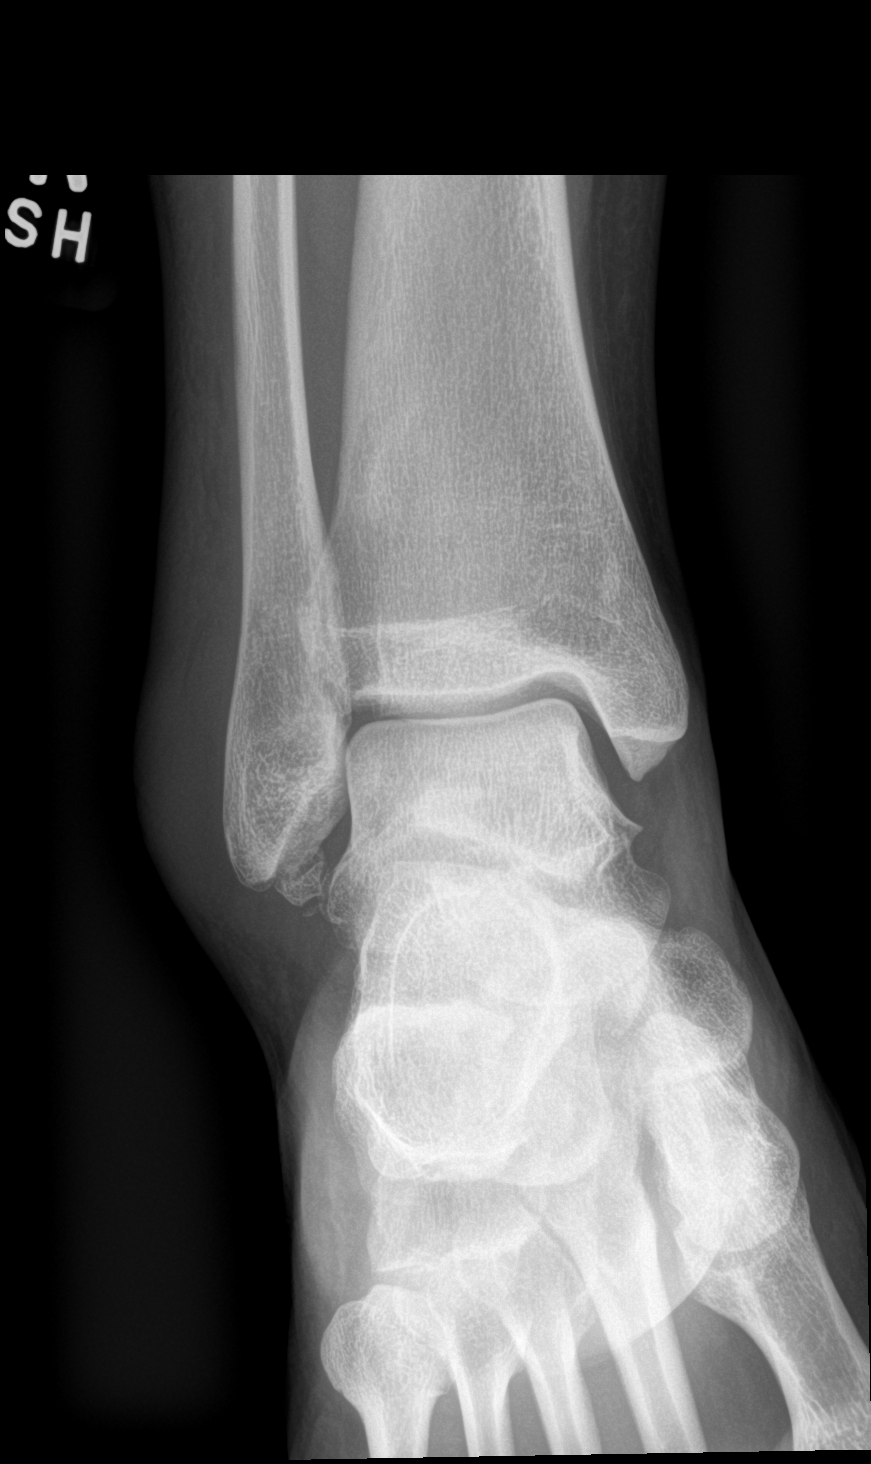

[ankle obl]
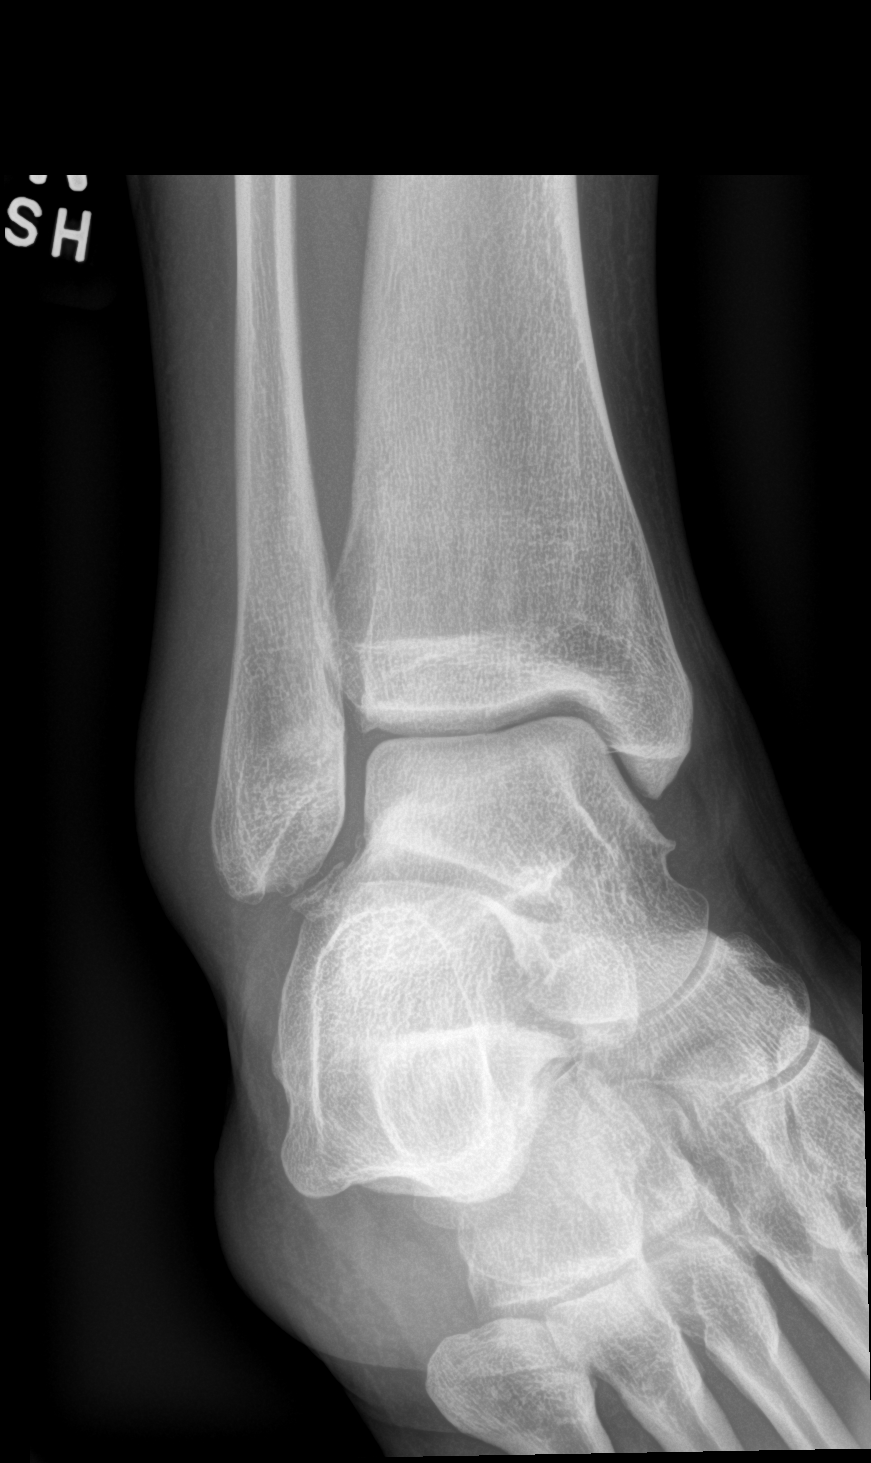

[ankle lat]
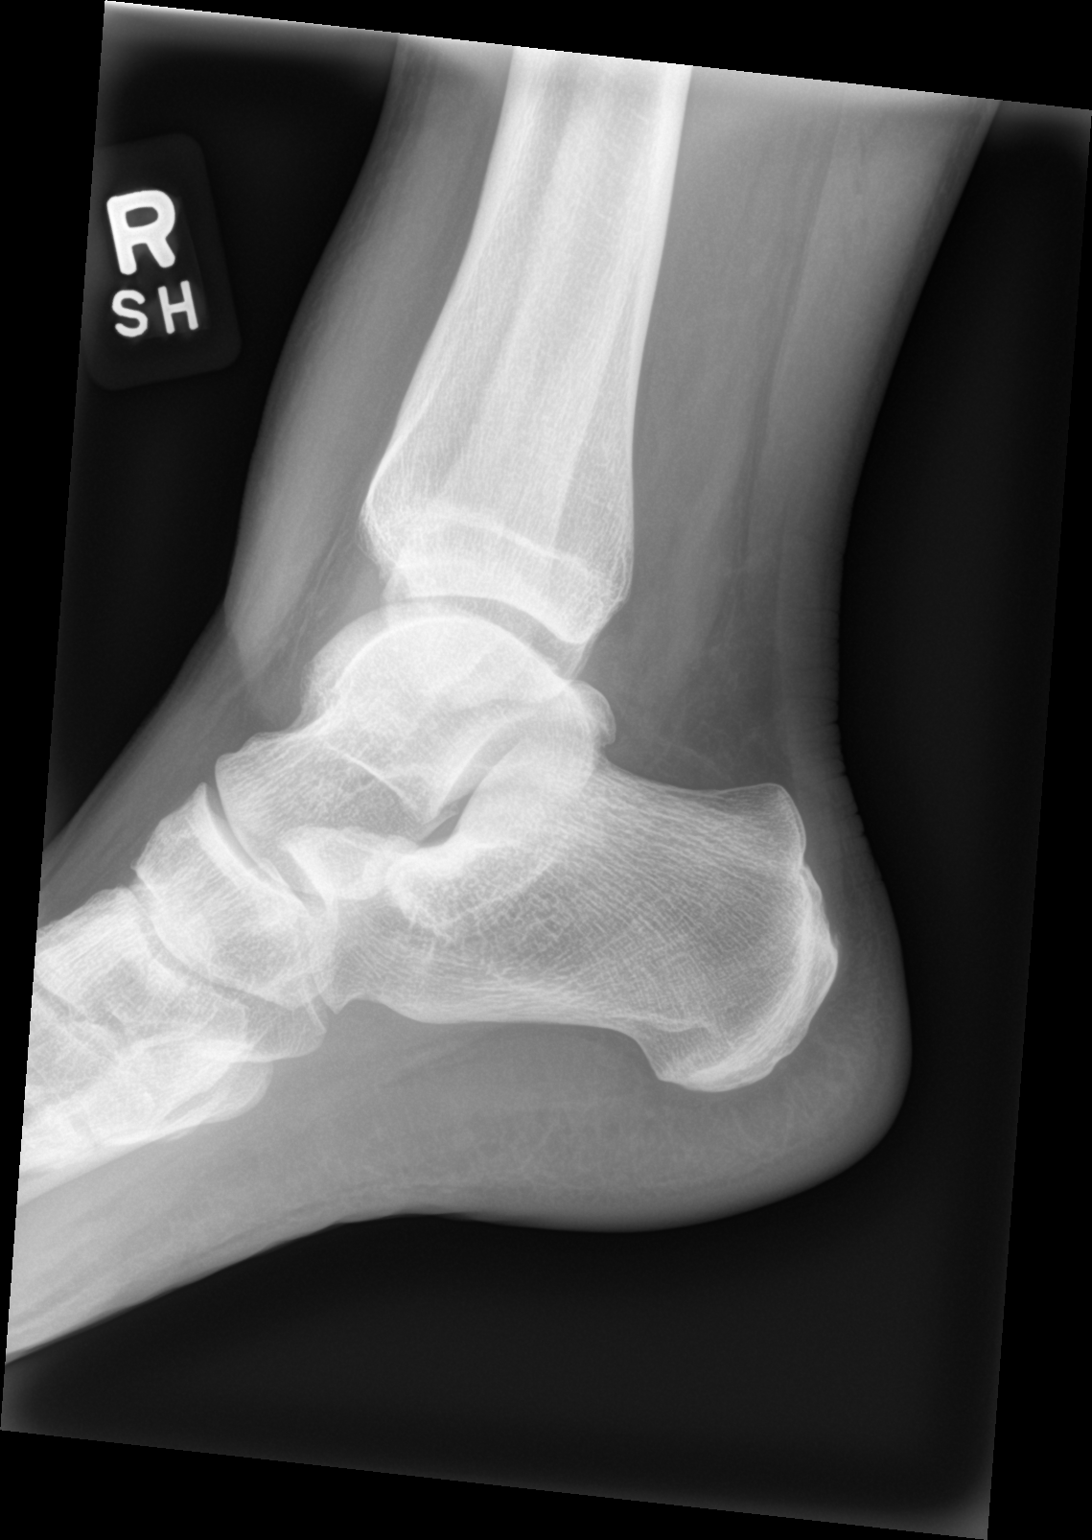

[3 of 3 positions shown; findings below may reference images not displayed]

FINDINGS: Two apparent ossific fragments inferior to the fibula and lateral to
the calcaneus measure 9 mm and 3 mm, respectively, and may represent
avulsion fractures. An os subfibular is also noted, measuring 8 mm.
There is no joint dislocation or joint effusion. There is soft
tissue swelling around the lateral malleolus.
IMPRESSION: Two apparent ossific fragments inferior to the fibula may represent
avulsion fractures.

## 2021-10-20 ENCOUNTER — Encounter: Payer: Managed Care, Other (non HMO) | Admitting: *Deleted

## 2021-10-21 ENCOUNTER — Encounter: Payer: Managed Care, Other (non HMO) | Admitting: Physical Therapy

## 2021-10-28 ENCOUNTER — Encounter: Payer: Self-pay | Admitting: *Deleted

## 2021-10-28 ENCOUNTER — Ambulatory Visit: Payer: No Typology Code available for payment source | Admitting: *Deleted

## 2021-10-28 DIAGNOSIS — M25562 Pain in left knee: Secondary | ICD-10-CM

## 2021-10-28 DIAGNOSIS — M6281 Muscle weakness (generalized): Secondary | ICD-10-CM

## 2021-10-28 DIAGNOSIS — R600 Localized edema: Secondary | ICD-10-CM

## 2021-10-28 DIAGNOSIS — M25662 Stiffness of left knee, not elsewhere classified: Secondary | ICD-10-CM

## 2021-10-28 NOTE — Therapy (Signed)
OUTPATIENT PHYSICAL THERAPY LOWER EXTREMITY TREATMENT   Patient Name: Tonya Franklin MRN: [id number] DOB:Dec 21, 1995, 26 y.o., female Today's Date: 10/28/2021   PT End of Session - 10/28/21 1004     Visit Number 22    Number of Visits 28    Date for PT Re-Evaluation 11/02/21    PT Start Time 0945    PT Stop Time 1034    PT Time Calculation (min) 49 min                Past Medical History:  Diagnosis Date   Asthma    Bipolar disorder ([place])    BV (bacterial vaginosis) 05/31/2015   Depression with anxiety 10/10/2012   Hypertension 05/28/2018   IUD (intrauterine device) in place 10/23/2016   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   02/08/15 [facility] [place] LGSIL/mild dysplasia, HPV testing not done- never went for colpo, wants to f/u here     Colpo:____    Mood swings 2014   Obesity (BMI 30-39.9) 06/29/2017   Obsessive-compulsive disorder 06/29/2017   Right leg swelling 03/22/2015   Past Surgical History:  Procedure Laterality Date   adnoids     CYST EXCISION     cyst removed from face   Patient Active Problem List   Diagnosis Date Noted   Opioid abuse ([place]) 03/29/2021   Depression, major, single episode, moderate ([place]) 06/10/2020   GAD (generalized anxiety disorder) 06/10/2020   Hypertension 05/28/2018   Obsessive-compulsive disorder 06/29/2017   Alcohol abuse 06/29/2017   Overweight (BMI 25.0-29.9) 06/29/2017   IUD (intrauterine device) in place 10/23/2016   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  THERAPY DIAG:  Acute pain of left knee  Localized edema  Muscle weakness (generalized)  Stiffness of left knee, not elsewhere classified  Rationale for Evaluation and Treatment Rehabilitation  ONSET DATE: DOI:  06/22/21.  07/26/21 (surgery date).  SUBJECTIVE:   SUBJECTIVE STATEMENT:   Reports that she has been walking more at  lunch, doing squats with her standing desk. Can now sit more comfortably with knees flexed.  PERTINENT HISTORY: OCD.  PAIN:  Are you having pain? No OBJECTIVE:   TODAY'S TREATMENT:                                     EXERCISE LOG 10-28-21  LT LE  Exercise Repetitions and Resistance Comments  Recumbent bike 10 minutes at level 4.   TM    Backwards walking    Forward walking    Leg press  3 pl, seat 5 x30 reps   LAQ 4# 5 sec 3x10 reps   14 in lunge LT 2x10 focus on quad control   4in step down  3x10 focus on quad control   Balance pad    Heel ups/toe ups x20   Seated HS green x20     ASSESSMENT:  CLINICAL IMPRESSION:      Pt arrived today doing much better With LT knee. She was able to continue with LT LE strengthening, but requested not doing the machines today due to missing 2 weeks. Rx focused on quad and hamstring strengthening as well as balance. Pt with full extension now  GOALS: SHORT TERM GOALS: Target date: 11/11/2021  Independent with an initial HEP. Baseline: Goal status:MET  2.  Full left knee extension  in supine. Baseline:  Goal status: MET    LONG TERM GOALS: Target date: 11/25/2021   Independent with an advanced HEP. Baseline:  Goal status: On-going  2.  Active left knee flexion to 125 degrees+ so the patient can perform functional tasks and do so with pain not > 2-3/10. Baseline:  Goal status: MET 09-29-21  3.  Increase left hip and knee strength to a 5/5 to provide good stability for accomplishment of functional activities. Baseline:  Goal status: On-going  4.  Perform a reciprocating stair gait with one railing with pain not > 2/10. Baseline:  Goal status: MET  5.  Patient walk a community distance without assistive device. Baseline:  Goal status: MET  6.  Return to PLOF. Baseline:  Goal status: On-going   PLAN: PT FREQUENCY:  2-3 times a week for 4 weeks.  PT DURATION: 4 weeks  PLANNED INTERVENTIONS: Therapeutic exercises,  Therapeutic activity, Neuromuscular re-education, Balance training, Gait training, Patient/Family education, Stair training, Electrical stimulation, Cryotherapy, and Ultrasound  PLAN FOR NEXT SESSION:  Recumbent bike progression, progress per protocol, work on improving range of motion,quad activation and Gait    Lexis Potenza,CHRIS, PTA 10/28/2021, 12:48 PM

## 2021-11-02 ENCOUNTER — Encounter: Payer: Managed Care, Other (non HMO) | Admitting: Physical Therapy

## 2021-11-03 ENCOUNTER — Encounter: Payer: Managed Care, Other (non HMO) | Admitting: *Deleted

## 2021-11-04 ENCOUNTER — Encounter: Payer: Managed Care, Other (non HMO) | Admitting: *Deleted

## 2021-11-09 ENCOUNTER — Ambulatory Visit: Payer: No Typology Code available for payment source | Attending: Specialist | Admitting: *Deleted

## 2021-11-09 DIAGNOSIS — R600 Localized edema: Secondary | ICD-10-CM | POA: Insufficient documentation

## 2021-11-09 DIAGNOSIS — M25562 Pain in left knee: Secondary | ICD-10-CM | POA: Insufficient documentation

## 2021-11-09 DIAGNOSIS — M6281 Muscle weakness (generalized): Secondary | ICD-10-CM | POA: Insufficient documentation

## 2021-11-09 DIAGNOSIS — M25662 Stiffness of left knee, not elsewhere classified: Secondary | ICD-10-CM | POA: Insufficient documentation

## 2021-11-11 ENCOUNTER — Encounter: Payer: Self-pay | Admitting: *Deleted

## 2021-11-11 ENCOUNTER — Ambulatory Visit: Payer: No Typology Code available for payment source | Admitting: *Deleted

## 2021-11-11 DIAGNOSIS — M25562 Pain in left knee: Secondary | ICD-10-CM | POA: Diagnosis present

## 2021-11-11 DIAGNOSIS — R600 Localized edema: Secondary | ICD-10-CM | POA: Diagnosis present

## 2021-11-11 DIAGNOSIS — M6281 Muscle weakness (generalized): Secondary | ICD-10-CM

## 2021-11-11 DIAGNOSIS — M25662 Stiffness of left knee, not elsewhere classified: Secondary | ICD-10-CM

## 2021-11-11 NOTE — Therapy (Signed)
OUTPATIENT PHYSICAL THERAPY LOWER EXTREMITY TREATMENT   Patient Name: Tonya Franklin MRN: [id number] DOB:1995-11-24, 26 y.o., female Today's Date: 11/11/2021   PT End of Session - 11/11/21 0839     Visit Number 23    Number of Visits 28    Date for PT Re-Evaluation 11/02/21    PT Start Time 0822    PT Stop Time 0918    PT Time Calculation (min) 56 min                Past Medical History:  Diagnosis Date   Asthma    Bipolar disorder ([place])    BV (bacterial vaginosis) 05/31/2015   Depression with anxiety 10/10/2012   Hypertension 05/28/2018   IUD (intrauterine device) in place 10/23/2016   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   02/08/15 [facility] LGSIL/mild dysplasia, HPV testing not done- never went for colpo, wants to f/u here     Colpo:____    Mood swings 2014   Obesity (BMI 30-39.9) 06/29/2017   Obsessive-compulsive disorder 06/29/2017   Right leg swelling 03/22/2015   Past Surgical History:  Procedure Laterality Date   adnoids     CYST EXCISION     cyst removed from face   Patient Active Problem List   Diagnosis Date Noted   Opioid abuse ([place]) 03/29/2021   Depression, major, single episode, moderate ([place]) 06/10/2020   GAD (generalized anxiety disorder) 06/10/2020   Hypertension 05/28/2018   Obsessive-compulsive disorder 06/29/2017   Alcohol abuse 06/29/2017   Overweight (BMI 25.0-29.9) 06/29/2017   IUD (intrauterine device) in place 10/23/2016   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  THERAPY DIAG:  Acute pain of left knee  Localized edema  Muscle weakness (generalized)  Stiffness of left knee, not elsewhere classified  Rationale for Evaluation and Treatment Rehabilitation  ONSET DATE: DOI:  06/22/21.  07/26/21 (surgery date).  SUBJECTIVE:   SUBJECTIVE STATEMENT:  Pt reports LT hip has started hurting and LT  knee popping at times for about 2 weeks now PERTINENT HISTORY: OCD.  PAIN:  Are you having pain? 3/10 OBJECTIVE:   TODAY'S TREATMENT:                                     EXERCISE LOG 11-11-21  LT LE  Exercise Repetitions and Resistance Comments  Recumbent bike 18 minutes at level 2-3.   TM    Backwards walking    Forward walking    Leg press     LAQ    14 in lunge LT    4in step down     Balance pad    Heel ups/toe ups    Seated HS green    Supine: LT hip clam 2x10 pause at top   LT hip Rev. Clam  2x10   Hip ABD with knee bent 2x10   Hip ABD with knee straight 2x10     MODALITIES: IFC/Ice pack to ITB LT side with Pt RT side lying. X 10 mins  ASSESSMENT:  CLINICAL IMPRESSION:     Pt arrived today having some LT knee popping lateral aspect at ITB insertion as well as into LT hip. She was very tender along entie ITB and soreness into hip. She did well with therex today and add hip exercises. IFC and ice end of  session to ITB/hip area LT side. Decreased pain end of session.  GOALS: SHORT TERM GOALS: Target date: 11/25/2021  Independent with an initial HEP. Baseline: Goal status:MET  2.  Full left knee extension in supine. Baseline:  Goal status: MET    LONG TERM GOALS: Target date: 12/09/2021   Independent with an advanced HEP. Baseline:  Goal status: On-going  2.  Active left knee flexion to 125 degrees+ so the patient can perform functional tasks and do so with pain not > 2-3/10. Baseline:  Goal status: MET 09-29-21  3.  Increase left hip and knee strength to a 5/5 to provide good stability for accomplishment of functional activities. Baseline:  Goal status: On-going  4.  Perform a reciprocating stair gait with one railing with pain not > 2/10. Baseline:  Goal status: MET  5.  Patient walk a community distance without assistive device. Baseline:  Goal status: MET  6.  Return to PLOF. Baseline:  Goal status: On-going   PLAN: PT FREQUENCY:  2-3  times a week for 4 weeks.  PT DURATION: 4 weeks  PLANNED INTERVENTIONS: Therapeutic exercises, Therapeutic activity, Neuromuscular re-education, Balance training, Gait training, Patient/Family education, Stair training, Electrical stimulation, Cryotherapy, and Ultrasound  PLAN FOR NEXT SESSION:  Recumbent bike progression, progress per protocol, work on improving range of motion,quad activation and Gait    Kimbely Whiteaker,CHRIS, PTA 11/11/2021, 12:49 PM

## 2021-11-16 ENCOUNTER — Encounter: Payer: No Typology Code available for payment source | Admitting: Physical Therapy

## 2021-11-18 ENCOUNTER — Encounter: Payer: No Typology Code available for payment source | Admitting: *Deleted

## 2022-02-21 ENCOUNTER — Encounter: Payer: Self-pay | Admitting: Gastroenterology

## 2022-03-20 ENCOUNTER — Encounter: Payer: Self-pay | Admitting: Family Medicine

## 2022-03-20 ENCOUNTER — Telehealth (INDEPENDENT_AMBULATORY_CARE_PROVIDER_SITE_OTHER): Payer: No Typology Code available for payment source | Admitting: Family Medicine

## 2022-03-20 DIAGNOSIS — J029 Acute pharyngitis, unspecified: Secondary | ICD-10-CM

## 2022-03-20 MED ORDER — AMOXICILLIN 500 MG PO CAPS: 500.0000 mg | ORAL_CAPSULE | Freq: Two times a day (BID) | ORAL | 0 refills | Status: AC

## 2022-03-20 NOTE — Progress Notes (Signed)
   Virtual Visit via video Note   Due to COVID-19 pandemic this visit was conducted virtually. This visit type was conducted due to national recommendations for restrictions regarding the COVID-19 Pandemic (e.g. social distancing, sheltering in place) in an effort to limit this patient's exposure and mitigate transmission in our community. All issues noted in this document were discussed and addressed.  A physical exam was not performed with this format.  I connected with  Tomieka Schaff Stucker  on 03/20/22 at 1323 by video and verified that I am speaking with the correct person using two identifiers. Winn Jock Mcglothen is currently located at home and no on is currently with her during the visit. The provider, Gwenlyn Perking, FNP is located in their office at time of visit.  I discussed the limitations, risks, security and privacy concerns of performing an evaluation and management service by video  and the availability of in person appointments. I also discussed with the patient that there may be a patient responsible charge related to this service. The patient expressed understanding and agreed to proceed.  CC: sore throat  History and Present Illness:  Sore Throat: Patient complains of sore throat. Associated symptoms include enlarged tonsils, sore throat, swollen glands, and white spots in throat.Also reports fever with max tem of 101.7 and myalgias. Onset of symptoms was 2 days ago, unchanged since that time. Denies cough, congestion, chest pain, shortness of breath. She is drinking plenty of fluids. She has not had recent close exposure to someone with proven streptococcal pharyngitis.    ROS As per HPI.     Observations/Objective: Alert and oriented x 3. Non toxic appearing. Respirations unlabored. Able to speak in full sentences without difficulty. Normal mood and behavior.    Assessment and Plan: Diagnoses and all orders for this visit:  Pharyngitis, unspecified etiology Unable  to come by for testing. Will treat empirically for strep with amoxicillin as below base on symptoms.  -     amoxicillin (AMOXIL) 500 MG capsule; Take 1 capsule (500 mg total) by mouth 2 (two) times daily for 10 days.     Follow Up Instructions: Return to office for new or worsening symptoms, or if symptoms persist.     I discussed the assessment and treatment plan with the patient. The patient was provided an opportunity to ask questions and all were answered. The patient agreed with the plan and demonstrated an understanding of the instructions.   The patient was advised to call back or seek an in-person evaluation if the symptoms worsen or if the condition fails to improve as anticipated.  The above assessment and management plan was discussed with the patient. The patient verbalized understanding of and has agreed to the management plan. Patient is aware to call the clinic if symptoms persist or worsen. Patient is aware when to return to the clinic for a follow-up visit. Patient educated on when it is appropriate to go to the emergency department.   Time call ended: 1329  I provided 6 minutes of face-to-face time during this encounter.    Gwenlyn Perking, FNP

## 2022-03-21 ENCOUNTER — Ambulatory Visit: Payer: Self-pay

## 2022-03-25 NOTE — Progress Notes (Deleted)
Referring Provider: Sharion Balloon, FNP Primary Care Physician:  Sharion Balloon, FNP   Reason for Consultation: Constipation   IMPRESSION:  ***  PLAN: ***   HPI: Tonya Franklin is a 27 y.o. female   She has a history of opioid abuse, obsessive-compulsive disorder, generalized anxiety disorder, depression, and alcohol abuse.  Abdominal ultrasound 05/27/2018 was normal Abdominal x-ray 09/08/2021 showed a moderately large stool burden  There is no known family history of colon cancer or polyps. No family history of stomach cancer or other GI malignancy. No family history of inflammatory bowel disease or celiac.    Past Medical History:  Diagnosis Date   Asthma    Bipolar disorder ([place])    BV (bacterial vaginosis) 05/31/2015   Depression with anxiety 10/10/2012   Hypertension 05/28/2018   IUD (intrauterine device) in place 10/23/2016   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   02/08/15 [facility] LGSIL/mild dysplasia, HPV testing not done- never went for colpo, wants to f/u here     Colpo:____    Mood swings 2014   Obesity (BMI 30-39.9) 06/29/2017   Obsessive-compulsive disorder 06/29/2017   Right leg swelling 03/22/2015    Past Surgical History:  Procedure Laterality Date   adnoids     CYST EXCISION     cyst removed from face    Current Outpatient Medications  Medication Sig Dispense Refill   albuterol (VENTOLIN HFA) 108 (90 Base) MCG/ACT inhaler TAKE 2 PUFFS BY MOUTH EVERY 6 HOURS AS NEEDED FOR WHEEZE OR SHORTNESS OF BREATH 8.5 each 2   amoxicillin (AMOXIL) 500 MG capsule Take 1 capsule (500 mg total) by mouth 2 (two) times daily for 10 days. 20 capsule 0   FLUoxetine (PROZAC) 20 MG tablet Take 3 tablets (60 mg total) by mouth daily. 90 tablet 3   fluticasone (FLONASE) 50 MCG/ACT nasal spray Place 2 sprays into both nostrils daily. 16 g 6   hydrOXYzine (VISTARIL) 25 MG capsule TAKE 1 CAPSULE BY MOUTH THREE TIMES A DAY AS NEEDED 270 capsule 0    levonorgestrel (MIRENA) 20 MCG/24HR IUD by Intrauterine route.     lisinopril (ZESTRIL) 20 MG tablet TAKE 1 TABLET BY MOUTH EVERY DAY 90 tablet 0   No current facility-administered medications for this visit.    Allergies as of 03/28/2022 - Review Complete 03/20/2022  Allergen Reaction Noted   Cefuroxime axetil Other (See Comments) 10/23/2016    Family History  Problem Relation Age of Onset   Diabetes Father    Hyperlipidemia Father    Hypertension Father    Cancer Paternal Grandmother        lung   Cancer Other        breast   Cancer Other        ovarian and cervical    Social History   Socioeconomic History   Marital status: Single    Spouse name: Not on file   Number of children: Not on file   Years of education: Not on file   Highest education level: Not on file  Occupational History   Not on file  Tobacco Use   Smoking status: Some Days    Types: Cigarettes    Last attempt to quit: 03/19/2014    Years since quitting: 8.0   Smokeless tobacco: Never  Vaping Use   Vaping Use: Never used  Substance and Sexual Activity   Alcohol use: No    Comment: occ   Drug use: No  Comment: occ   Sexual activity: Yes    Birth control/protection: Condom  Other Topics Concern   Not on file  Social History Narrative   Not on file   Social Determinants of Health   Financial Resource Strain: Not on file  Food Insecurity: Not on file  Transportation Needs: Not on file  Physical Activity: Not on file  Stress: Not on file  Social Connections: Not on file  Intimate Partner Violence: Not on file    Review of Systems: 12 system ROS is negative except as noted above.   Physical Exam: General:   Alert,  well-nourished, pleasant and cooperative in NAD Head:  Normocephalic and atraumatic. Eyes:  Sclera clear, no icterus.   Conjunctiva pink. Ears:  Normal auditory acuity. Nose:  No deformity, discharge,  or lesions. Mouth:  No deformity or lesions.   Neck:  Supple; no  masses or thyromegaly. Lungs:  Clear throughout to auscultation.   No wheezes. Heart:  Regular rate and rhythm; no murmurs. Abdomen:  Soft, nontender, nondistended, normal bowel sounds, no rebound or guarding. No hepatosplenomegaly.   Rectal:  Deferred  Msk:  Symmetrical. No boney deformities LAD: No inguinal or umbilical LAD Extremities:  No clubbing or edema. Neurologic:  Alert and  oriented x4;  grossly nonfocal Skin:  Intact without significant lesions or rashes. Psych:  Alert and cooperative. Normal mood and affect.    Lukis Bunt L. Tarri Glenn, MD, MPH 03/25/2022, 4:09 PM

## 2022-03-28 ENCOUNTER — Ambulatory Visit: Payer: No Typology Code available for payment source | Admitting: Gastroenterology

## 2022-05-11 ENCOUNTER — Encounter: Payer: No Typology Code available for payment source | Admitting: Family

## 2022-05-15 ENCOUNTER — Ambulatory Visit: Payer: No Typology Code available for payment source | Admitting: Family

## 2022-05-30 ENCOUNTER — Ambulatory Visit (INDEPENDENT_AMBULATORY_CARE_PROVIDER_SITE_OTHER): Payer: No Typology Code available for payment source | Admitting: Family

## 2022-05-30 ENCOUNTER — Encounter: Payer: No Typology Code available for payment source | Admitting: Family

## 2022-05-30 ENCOUNTER — Encounter: Payer: Self-pay | Admitting: Family

## 2022-05-30 VITALS — BP 127/77 | HR 76 | Temp 98.4°F | Ht 64.0 in | Wt 166.6 lb

## 2022-05-30 DIAGNOSIS — F321 Major depressive disorder, single episode, moderate: Secondary | ICD-10-CM | POA: Diagnosis not present

## 2022-05-30 DIAGNOSIS — Z0001 Encounter for general adult medical examination with abnormal findings: Secondary | ICD-10-CM | POA: Diagnosis not present

## 2022-05-30 DIAGNOSIS — H9202 Otalgia, left ear: Secondary | ICD-10-CM

## 2022-05-30 DIAGNOSIS — Z0000 Encounter for general adult medical examination without abnormal findings: Secondary | ICD-10-CM

## 2022-05-30 DIAGNOSIS — I10 Essential (primary) hypertension: Secondary | ICD-10-CM

## 2022-05-30 DIAGNOSIS — E663 Overweight: Secondary | ICD-10-CM

## 2022-05-30 DIAGNOSIS — F410 Panic disorder [episodic paroxysmal anxiety] without agoraphobia: Secondary | ICD-10-CM

## 2022-05-30 DIAGNOSIS — F411 Generalized anxiety disorder: Secondary | ICD-10-CM | POA: Diagnosis not present

## 2022-05-30 DIAGNOSIS — F418 Other specified anxiety disorders: Secondary | ICD-10-CM

## 2022-05-30 DIAGNOSIS — J301 Allergic rhinitis due to pollen: Secondary | ICD-10-CM

## 2022-05-30 DIAGNOSIS — R059 Cough, unspecified: Secondary | ICD-10-CM | POA: Diagnosis not present

## 2022-05-30 MED ORDER — ALBUTEROL SULFATE HFA 108 (90 BASE) MCG/ACT IN AERS: INHALATION_SPRAY | RESPIRATORY_TRACT | 2 refills | Status: DC

## 2022-05-30 MED ORDER — FLUTICASONE PROPIONATE 50 MCG/ACT NA SUSP: 2.0000 | Freq: Every day | NASAL | 6 refills | Status: AC

## 2022-05-30 MED ORDER — HYDROXYZINE PAMOATE 25 MG PO CAPS: ORAL_CAPSULE | ORAL | 0 refills | Status: DC

## 2022-05-30 MED ORDER — FLUOXETINE HCL 20 MG PO TABS: 60.0000 mg | ORAL_TABLET | Freq: Every day | ORAL | 3 refills | Status: DC

## 2022-05-30 NOTE — Patient Instructions (Signed)

## 2022-05-30 NOTE — Progress Notes (Signed)
Subjective:    Patient ID: Tonya Franklin, female    DOB: 29-Mar-1995, 27 y.o.   MRN: [id number]  Chief Complaint  Patient presents with   Annual Exam    BEEN OUT OF MEDICATIONS FOR A MONTH NEEDS A REFILLS    PT presents to the office today for CPE without pap. She had ACL surgery on 07/25/21 and doing well.   She has been out of all her medications for the last month. Her BP is at goal today.   She reports decrease focus and staying task. Has taken ADHD medications in the past, requesting restarting.  Hypertension This is a chronic problem. The current episode started more than 1 year ago. The problem has been resolved since onset. The problem is controlled. Associated symptoms include anxiety and malaise/fatigue. Pertinent negatives include no peripheral edema or shortness of breath. Risk factors for coronary artery disease include dyslipidemia and obesity. The current treatment provides moderate improvement.  Anxiety Presents for follow-up visit. Symptoms include depressed mood, excessive worry, nervous/anxious behavior, panic and restlessness. Patient reports no decreased concentration or shortness of breath. Symptoms occur most days. The severity of symptoms is moderate.    Depression        This is a chronic problem.  The current episode started more than 1 year ago.   Associated symptoms include fatigue, helplessness, hopelessness, irritable, restlessness, decreased interest and sad.  Associated symptoms include no decreased concentration.  Past treatments include SSRIs - Selective serotonin reuptake inhibitors.  Past medical history includes anxiety.       Review of Systems  Constitutional:  Positive for fatigue and malaise/fatigue.  Respiratory:  Negative for shortness of breath.   Psychiatric/Behavioral:  Positive for depression. Negative for decreased concentration. The patient is nervous/anxious.   All other systems reviewed and are negative.    Family History   Problem Relation Age of Onset   Diabetes Father    Hyperlipidemia Father    Hypertension Father    Cancer Paternal Grandmother        lung   Cancer Other        breast   Cancer Other        ovarian and cervical   Social History   Socioeconomic History   Marital status: Single    Spouse name: Not on file   Number of children: Not on file   Years of education: Not on file   Highest education level: Not on file  Occupational History   Not on file  Tobacco Use   Smoking status: Some Days    Types: Cigarettes    Last attempt to quit: 03/19/2014    Years since quitting: 8.2   Smokeless tobacco: Never  Vaping Use   Vaping Use: Never used  Substance and Sexual Activity   Alcohol use: No    Comment: occ   Drug use: No    Comment: occ   Sexual activity: Yes    Birth control/protection: Condom  Other Topics Concern   Not on file  Social History Narrative   Not on file   Social Determinants of Health   Financial Resource Strain: Not on file  Food Insecurity: Not on file  Transportation Needs: Not on file  Physical Activity: Not on file  Stress: Not on file  Social Connections: Not on file        Objective:   Physical Exam Vitals reviewed.  Constitutional:      General: She is irritable. She is not  in acute distress.    Appearance: She is well-developed.  HENT:     Head: Normocephalic and atraumatic.     Right Ear: Tympanic membrane normal.     Left Ear: Tympanic membrane normal.  Eyes:     Pupils: Pupils are equal, round, and reactive to light.  Neck:     Thyroid: No thyromegaly.  Cardiovascular:     Rate and Rhythm: Normal rate and regular rhythm.     Heart sounds: Normal heart sounds. No murmur heard. Pulmonary:     Effort: Pulmonary effort is normal. No respiratory distress.     Breath sounds: Normal breath sounds. No wheezing.  Abdominal:     General: Bowel sounds are normal. There is no distension.     Palpations: Abdomen is soft.     Tenderness:  There is no abdominal tenderness.  Musculoskeletal:        General: No tenderness. Normal range of motion.     Cervical back: Normal range of motion and neck supple.  Skin:    General: Skin is warm and dry.  Neurological:     Mental Status: She is alert and oriented to person, place, and time.     Cranial Nerves: No cranial nerve deficit.     Deep Tendon Reflexes: Reflexes are normal and symmetric.  Psychiatric:        Behavior: Behavior normal.        Thought Content: Thought content normal.        Judgment: Judgment normal.       BP 127/77   Pulse 76   Temp 98.4 F (36.9 C) (Temporal)   Ht 5\' 4"  (1.626 m)   Wt 166 lb 9.6 oz (75.6 kg)   SpO2 99%   BMI 28.60 kg/m      Assessment & Plan:  Tonya Franklin comes in today with chief complaint of Annual Exam (BEEN OUT OF MEDICATIONS FOR A MONTH NEEDS A REFILLS )   Diagnosis and orders addressed:  1. Cough, unspecified type - CMP14+EGFR - CBC with Differential/Platelet  2. GAD (generalized anxiety disorder) - FLUoxetine (PROZAC) 20 MG tablet; Take 3 tablets (60 mg total) by mouth daily.  Dispense: 90 tablet; Refill: 3 - CMP14+EGFR - CBC with Differential/Platelet - Ambulatory referral to Psychiatry  3. Depression, major, single episode, moderate (HCC) - FLUoxetine (PROZAC) 20 MG tablet; Take 3 tablets (60 mg total) by mouth daily.  Dispense: 90 tablet; Refill: 3 - CMP14+EGFR - CBC with Differential/Platelet - Ambulatory referral to Psychiatry  4. Acute otalgia, left - CMP14+EGFR - CBC with Differential/Platelet  5. Panic attack - hydrOXYzine (VISTARIL) 25 MG capsule; TAKE 1 CAPSULE BY MOUTH THREE TIMES A DAY AS NEEDED  Dispense: 270 capsule; Refill: 0 - CMP14+EGFR - CBC with Differential/Platelet  6. Depression with anxiety - hydrOXYzine (VISTARIL) 25 MG capsule; TAKE 1 CAPSULE BY MOUTH THREE TIMES A DAY AS NEEDED  Dispense: 270 capsule; Refill: 0 - CMP14+EGFR - CBC with Differential/Platelet  7. Primary  hypertension - CMP14+EGFR - CBC with Differential/Platelet  8. Overweight (BMI 25.0-29.9) - CMP14+EGFR - CBC with Differential/Platelet  9. Annual physical exam - CMP14+EGFR - CBC with Differential/Platelet - Lipid panel - TSH  10. Allergic rhinitis due to pollen, unspecified seasonality - albuterol (VENTOLIN HFA) 108 (90 Base) MCG/ACT inhaler; TAKE 2 PUFFS BY MOUTH EVERY 6 HOURS AS NEEDED FOR WHEEZE OR SHORTNESS OF BREATH  Dispense: 8.5 each; Refill: 2 - fluticasone (FLONASE) 50 MCG/ACT nasal spray; Place 2 sprays into both  nostrils daily.  Dispense: 16 g; Refill: 6   Labs pending Restart Prozac 20 mg for one week then increase to 40 mg for 4 weeks. If increased anxiety and depression  Health Maintenance reviewed Diet and exercise encouraged  Follow up plan: 6 months    [name], FNP

## 2022-05-31 LAB — CBC WITH DIFFERENTIAL/PLATELET
Basophils Absolute: 0 10*3/uL (ref 0.0–0.2)
Basos: 0 %
EOS (ABSOLUTE): 0 10*3/uL (ref 0.0–0.4)
Eos: 1 %
Hematocrit: 40.2 % (ref 34.0–46.6)
Hemoglobin: 13.5 g/dL (ref 11.1–15.9)
Immature Grans (Abs): 0 10*3/uL (ref 0.0–0.1)
Immature Granulocytes: 0 %
Lymphocytes Absolute: 2.6 10*3/uL (ref 0.7–3.1)
Lymphs: 35 %
MCH: 30.4 pg (ref 26.6–33.0)
MCHC: 33.6 g/dL (ref 31.5–35.7)
MCV: 91 fL (ref 79–97)
Monocytes Absolute: 0.3 10*3/uL (ref 0.1–0.9)
Monocytes: 4 %
Neutrophils Absolute: 4.5 10*3/uL (ref 1.4–7.0)
Neutrophils: 60 %
Platelets: 225 10*3/uL (ref 150–450)
RBC: 4.44 x10E6/uL (ref 3.77–5.28)
RDW: 12.4 % (ref 11.7–15.4)
WBC: 7.5 10*3/uL (ref 3.4–10.8)

## 2022-05-31 LAB — CMP14+EGFR
ALT: 19 IU/L (ref 0–32)
AST: 13 IU/L (ref 0–40)
Albumin/Globulin Ratio: 2 (ref 1.2–2.2)
Albumin: 4.3 g/dL (ref 4.0–5.0)
Alkaline Phosphatase: 58 IU/L (ref 44–121)
BUN/Creatinine Ratio: 15 (ref 9–23)
BUN: 11 mg/dL (ref 6–20)
Bilirubin Total: 0.2 mg/dL (ref 0.0–1.2)
CO2: 20 mmol/L (ref 20–29)
Calcium: 8.9 mg/dL (ref 8.7–10.2)
Chloride: 104 mmol/L (ref 96–106)
Creatinine, Ser: 0.74 mg/dL (ref 0.57–1.00)
Globulin, Total: 2.1 g/dL (ref 1.5–4.5)
Glucose: 112 mg/dL — ABNORMAL HIGH (ref 70–99)
Potassium: 4.4 mmol/L (ref 3.5–5.2)
Sodium: 139 mmol/L (ref 134–144)
Total Protein: 6.4 g/dL (ref 6.0–8.5)
eGFR: 113 mL/min/{1.73_m2} (ref >59)

## 2022-05-31 LAB — LIPID PANEL
Chol/HDL Ratio: 3.9 ratio (ref 0.0–4.4)
Cholesterol, Total: 175 mg/dL (ref 100–199)
HDL: 45 mg/dL (ref >39)
LDL Chol Calc (NIH): 105 mg/dL — ABNORMAL HIGH (ref 0–99)
Triglycerides: 138 mg/dL (ref 0–149)
VLDL Cholesterol Cal: 25 mg/dL (ref 5–40)

## 2022-05-31 LAB — TSH: TSH: 0.842 u[IU]/mL (ref 0.450–4.500)

## 2022-06-14 ENCOUNTER — Other Ambulatory Visit: Payer: Self-pay | Admitting: Family

## 2022-06-14 DIAGNOSIS — F321 Major depressive disorder, single episode, moderate: Secondary | ICD-10-CM

## 2022-06-14 DIAGNOSIS — F411 Generalized anxiety disorder: Secondary | ICD-10-CM

## 2022-06-20 ENCOUNTER — Ambulatory Visit: Payer: No Typology Code available for payment source | Attending: Specialist | Admitting: Physical Therapy

## 2022-06-20 ENCOUNTER — Other Ambulatory Visit: Payer: Self-pay

## 2022-06-20 ENCOUNTER — Encounter: Payer: Self-pay | Admitting: Physical Therapy

## 2022-06-20 DIAGNOSIS — M25562 Pain in left knee: Secondary | ICD-10-CM | POA: Diagnosis present

## 2022-06-20 DIAGNOSIS — M25552 Pain in left hip: Secondary | ICD-10-CM | POA: Insufficient documentation

## 2022-06-20 NOTE — Therapy (Signed)
OUTPATIENT PHYSICAL THERAPY LOWER EXTREMITY EVALUATION   Patient Name: Tonya Franklin MRN: [id number] DOB:1995/11/09, 27 y.o., female Today's Date: 06/20/2022  END OF SESSION:  PT End of Session - 06/20/22 0842     Visit Number 1    Number of Visits 12    Date for PT Re-Evaluation 08/01/22    PT Start Time 0817    PT Stop Time 0901    PT Time Calculation (min) 44 min    Activity Tolerance Patient tolerated treatment well    Behavior During Therapy [facility] for tasks assessed/performed             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   02/08/15 [facility] LGSIL/mild dysplasia, HPV testing not done- never went for colpo, wants to f/u here     Colpo:____    Mood swings 2014   Obesity (BMI 30-39.9) 06/29/2017   Obsessive-compulsive disorder 06/29/2017   Right leg swelling 03/22/2015   Past Surgical History:  Procedure Laterality Date   adnoids     CYST EXCISION     cyst removed from face   Patient Active Problem List   Diagnosis Date Noted   Opioid abuse (HCC) 03/29/2021   Depression, major, single episode, moderate (HCC) 06/10/2020   GAD (generalized anxiety disorder) 06/10/2020   Hypertension 05/28/2018   Obsessive-compulsive disorder 06/29/2017   Alcohol abuse 06/29/2017   Overweight (BMI 25.0-29.9) 06/29/2017   IUD (intrauterine device) in place 10/23/2016   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    REFERRING PROVIDER: Eugenia Mcalpine MD  REFERRING DIAG: S/p ACL reconstruction.  THERAPY DIAG:  Acute pain of left knee - Plan: PT plan of care cert/re-cert  Pain in left hip - Plan: PT plan of care cert/re-cert  Rationale for Evaluation and Treatment: Rehabilitation  ONSET DATE: 07/26/21 (surgery  date).  SUBJECTIVE:   SUBJECTIVE STATEMENT: The patient presents to the clinic with c/o left hip pain and occasions of left knee pain if she overexerts herself.  Her PMH is remarkable for a left ACL reconstruction performed on 07/27/22.  She states she didn't spend a lot of time working on her strength but is walking a lot.  Her about a mile and and half her knee will hurt some and feels some popping.  Her CC today, however, is pain in her left hip region rated at about a 4/10.  She is also looking into a [employer] custom fit knee brace and has contacted this company.  She would like to get rid of her hip pain and get her knee stronger.  PERTINENT HISTORY: Previous left knee ACL reconstruction. PAIN:  Are you having pain? Yes: NPRS scale: left hip and knee./10 Pain location: Left hip/knee. Pain description: Sharp. Aggravating factors: Overactivity. Relieving factors: Rest.  PRECAUTIONS: None  WEIGHT BEARING RESTRICTIONS: No  FALLS:  Has patient fallen in last 6 months? No  LIVING ENVIRONMENT: Lives in: House/apartment Has following equipment at home: None  OCCUPATION: Works from home.  PLOF: Independent  PATIENT GOALS: Get rid of pain.  Workout and be more active.    OBJECTIVE:   PATIENT SURVEYS: FOTO .   PALPATION: Tender to palpation over left lateral hip musculature with referred pain into lateral proximal 1/3 of left  hip.  LOWER EXTREMITY ROM: Full left knee and hip range of motion.  Crepitus noted with left knee AROM especially into extension.  LOWER EXTREMITY MMT:  Via MMTing left hip and knee is normal strength.  LOWER EXTREMITY SPECIAL TESTS:  Min left knee laxity compared to right.  (-) left hip Scour test. (-) FABER test.  GAIT: WNL.  OBSERVATION:  Left quadriceps atrophy per contralateral comparison (right exhibits excellent muscularity).   TODAY'S TREATMENT:                                                                                                                               DATE: IFC at 80-150 Hz on 40% scan x 20 minutes to patient's left lateral hip musculature.   Patient tolerated treatment without complaint with normal modality response following removal of modality.  Patient felt good after treatment.   ASSESSMENT:  CLINICAL IMPRESSION: The patient presents to OPPT with c/o left hip pain and pain and popping of left knee after overexerting herself.  Her left knee does have crepitus especially at endrange extension.  She exhibits left quadriceps atrophy per contralateral comparison.  She is planning on obtaining a custom fit [employer] tone.  Her CC, however, is left hip pain.  She was found to have palpable pain and increased tone over her left lateral hip musculature with referral over her proximal lateral hip region.  Her left knee and hip strength per MMTing is normal.  Her left knee exbits some laxity per contralateral comparison.  Patient will benefit from skilled physical therapy intervention to address pain and deficits.  OBJECTIVE IMPAIRMENTS: decreased activity tolerance, increased muscle spasms, and pain.   ACTIVITY LIMITATIONS:  Intense exercise.  PERSONAL FACTORS: 1 comorbidity: left ACL reconstruction  are also affecting patient's functional outcome.   REHAB POTENTIAL: Excellent  CLINICAL DECISION MAKING: Stable/uncomplicated  EVALUATION COMPLEXITY: Low   GOALS:   LONG TERM GOALS: Target date: 07/31/12.  Ind with an advanced HEP.  Goal status: INITIAL  2.  Perform ADL's with pain not > 1-2/10.  Goal status: INITIAL  3.  Walk a mile with left hip and knee pain not > 1-2/10.  Goal status: INITIAL  PLAN:  PT FREQUENCY:  2-3 times a week.  PT DURATION: 4 weeks  PLANNED INTERVENTIONS: Therapeutic exercises, Therapeutic activity, Neuromuscular re-education, Patient/Family education, Self Care, Dry Needling, Electrical stimulation, Cryotherapy, Moist heat, Ultrasound, and Manual therapy  PLAN FOR NEXT  SESSION: STW/M to left hip, advanced left knee and hip strengthening.  Modalities as needed.   Leilany Digeronimo, [place], PT 06/20/2022, 10:01 AM

## 2022-06-29 ENCOUNTER — Ambulatory Visit: Payer: No Typology Code available for payment source | Admitting: *Deleted

## 2022-07-03 ENCOUNTER — Ambulatory Visit: Payer: No Typology Code available for payment source | Attending: Specialist | Admitting: Physical Therapy

## 2022-07-03 ENCOUNTER — Encounter: Payer: Self-pay | Admitting: Physical Therapy

## 2022-07-03 DIAGNOSIS — R600 Localized edema: Secondary | ICD-10-CM | POA: Insufficient documentation

## 2022-07-03 DIAGNOSIS — M25562 Pain in left knee: Secondary | ICD-10-CM | POA: Diagnosis present

## 2022-07-03 DIAGNOSIS — M25552 Pain in left hip: Secondary | ICD-10-CM | POA: Diagnosis present

## 2022-07-03 NOTE — Therapy (Signed)
OUTPATIENT PHYSICAL THERAPY LOWER EXTREMITY EVALUATION   Patient Name: Tonya Franklin MRN: [id number] DOB:[birth year], 27 y.o., female Today's Date: 07/03/2022  END OF SESSION:  PT End of Session - 07/03/22 0841     Visit Number 2    Number of Visits 12    Date for PT Re-Evaluation 08/01/22    PT Start Time 0801    PT Stop Time 0841    PT Time Calculation (min) 40 min    Activity Tolerance Patient tolerated treatment well    Behavior During Therapy [facility] for tasks assessed/performed             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   02/08/15 [facility] LGSIL/mild dysplasia, HPV testing not done- never went for colpo, wants to f/u here     Colpo:____    Mood swings 2014   Obesity (BMI 30-39.9) 06/29/2017   Obsessive-compulsive disorder 06/29/2017   Right leg swelling 03/22/2015   Past Surgical History:  Procedure Laterality Date   adnoids     CYST EXCISION     cyst removed from face   Patient Active Problem List   Diagnosis Date Noted   Opioid abuse (HCC) 03/29/2021   Depression, major, single episode, moderate (HCC) 06/10/2020   GAD (generalized anxiety disorder) 06/10/2020   Hypertension 05/28/2018   Obsessive-compulsive disorder 06/29/2017   Alcohol abuse 06/29/2017   Overweight (BMI 25.0-29.9) 06/29/2017   IUD (intrauterine device) in place 10/23/2016   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    REFERRING PROVIDER: Eugenia Mcalpine MD  REFERRING DIAG: S/p ACL reconstruction.  THERAPY DIAG:  Acute pain of left knee  Pain in left hip  Rationale for Evaluation and Treatment: Rehabilitation  ONSET DATE: 07/26/21 (surgery date).  SUBJECTIVE:   SUBJECTIVE STATEMENT: Pain in left hip after  sitting with knees to chest and then getting up.  PERTINENT HISTORY: Previous left knee ACL reconstruction. PAIN:  Are you having pain? Yes: NPRS scale: Minimal right now/10 Pain location: Left hip/knee. Pain description: Sharp. Aggravating factors: Overactivity. Relieving factors: Rest.  PRECAUTIONS: None  WEIGHT BEARING RESTRICTIONS: No  FALLS:  Has patient fallen in last 6 months? No  LIVING ENVIRONMENT: Lives in: House/apartment Has following equipment at home: None  OCCUPATION: Works from home.  PLOF: Independent  PATIENT GOALS: Get rid of pain.  Workout and be more active.    OBJECTIVE:   PATIENT SURVEYS: FOTO .   PALPATION: Tender to palpation over left lateral hip musculature with referred pain into lateral proximal 1/3 of left hip.  LOWER EXTREMITY ROM: Full left knee and hip range of motion.  Crepitus noted with left knee AROM especially into extension.  LOWER EXTREMITY MMT:  Via MMTing left hip and knee is normal strength.  LOWER EXTREMITY SPECIAL TESTS:  Min left knee laxity compared to right.  (-) left hip Scour test. (-) FABER test.  GAIT: WNL.  OBSERVATION:  Left quadriceps atrophy per contralateral comparison (right exhibits excellent muscularity).   TODAY'S TREATMENT:  DATE: 07/03/22:  Recumbent bike x 11 minutes f/b combo e'stim/US at 1.50 W/CM2 x 6 minutes to patient's left lateral hip musculature with patient in right sdly position with pillow between knees for comfort f/b STW/M x 7 minutes with ischemic release technique utilized f/b IFC at 80-150 Hz on 40% scan x 11 minutes to patient's left lateral hip musculature.   Patient tolerated treatment without complaint with normal modality response following removal of modality.  Patient felt good after treatment.   ASSESSMENT:  CLINICAL IMPRESSION: Patient did well with  treatment today focused on her left hip.  Provided patient with a dry needling consent form.  She felt good after treatment today.  OBJECTIVE IMPAIRMENTS: decreased activity tolerance, increased muscle spasms, and pain.   ACTIVITY LIMITATIONS:  Intense exercise.  PERSONAL FACTORS: 1 comorbidity: left ACL reconstruction  are also affecting patient's functional outcome.   REHAB POTENTIAL: Excellent  CLINICAL DECISION MAKING: Stable/uncomplicated  EVALUATION COMPLEXITY: Low   GOALS:   LONG TERM GOALS: Target date: 07/31/12.  Ind with an advanced HEP.  Goal status: INITIAL  2.  Perform ADL's with pain not > 1-2/10.  Goal status: INITIAL  3.  Walk a mile with left hip and knee pain not > 1-2/10.  Goal status: INITIAL  PLAN:  PT FREQUENCY:  2-3 times a week.  PT DURATION: 4 weeks  PLANNED INTERVENTIONS: Therapeutic exercises, Therapeutic activity, Neuromuscular re-education, Patient/Family education, Self Care, Dry Needling, Electrical stimulation, Cryotherapy, Moist heat, Ultrasound, and Manual therapy  PLAN FOR NEXT SESSION: STW/M to left hip, advanced left knee and hip strengthening.  Modalities as needed.   Lyrah Bradt, [place], PT 07/03/2022, 8:54 AM

## 2022-07-20 ENCOUNTER — Ambulatory Visit: Payer: No Typology Code available for payment source | Admitting: Physical Therapy

## 2022-07-27 ENCOUNTER — Encounter: Payer: Self-pay | Admitting: *Deleted

## 2022-07-27 ENCOUNTER — Ambulatory Visit: Payer: No Typology Code available for payment source | Admitting: *Deleted

## 2022-07-27 DIAGNOSIS — M25562 Pain in left knee: Secondary | ICD-10-CM

## 2022-07-27 DIAGNOSIS — R600 Localized edema: Secondary | ICD-10-CM

## 2022-07-27 DIAGNOSIS — M25552 Pain in left hip: Secondary | ICD-10-CM

## 2022-07-27 NOTE — Therapy (Signed)
OUTPATIENT PHYSICAL THERAPY LOWER EXTREMITY EVALUATION   Patient Name: Tonya Franklin MRN: [id number] DOB:04/04/1995, 27 y.o., female Today's Date: 07/27/2022  END OF SESSION:  PT End of Session - 07/27/22 0859     Visit Number 3    Number of Visits 12    Date for PT Re-Evaluation 08/01/22    PT Start Time 0805    PT Stop Time 0850    PT Time Calculation (min) 45 min             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   02/08/15 [facility] [place] LGSIL/mild dysplasia, HPV testing not done- never went for colpo, wants to f/u here     Colpo:____    Mood swings 2014   Obesity (BMI 30-39.9) 06/29/2017   Obsessive-compulsive disorder 06/29/2017   Right leg swelling 03/22/2015   Past Surgical History:  Procedure Laterality Date   adnoids     CYST EXCISION     cyst removed from face   Patient Active Problem List   Diagnosis Date Noted   Opioid abuse (HCC) 03/29/2021   Depression, major, single episode, moderate (HCC) 06/10/2020   GAD (generalized anxiety disorder) 06/10/2020   Hypertension 05/28/2018   Obsessive-compulsive disorder 06/29/2017   Alcohol abuse 06/29/2017   Overweight (BMI 25.0-29.9) 06/29/2017   IUD (intrauterine device) in place 10/23/2016   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    REFERRING PROVIDER: Eugenia Mcalpine MD  REFERRING DIAG: S/p ACL reconstruction.  THERAPY DIAG:  Acute pain of left knee  Pain in left hip  Localized edema  Rationale for Evaluation and Treatment: Rehabilitation  ONSET DATE: 07/26/21 (surgery date).  SUBJECTIVE:   SUBJECTIVE STATEMENT: LT hip is getting worse when I'M walking  PERTINENT HISTORY: Previous left knee ACL reconstruction. PAIN:  Are you  having pain? Yes: NPRS scale: 5/10 Pain location: Left hip/knee. Pain description: Sharp. Aggravating factors: Overactivity. Relieving factors: Rest.  PRECAUTIONS: None  WEIGHT BEARING RESTRICTIONS: No  FALLS:  Has patient fallen in last 6 months? No  LIVING ENVIRONMENT: Lives in: House/apartment Has following equipment at home: None  OCCUPATION: Works from home.  PLOF: Independent  PATIENT GOALS: Get rid of pain.  Workout and be more active.    OBJECTIVE:   PATIENT SURVEYS: FOTO .   PALPATION: Tender to palpation over left lateral hip musculature with referred pain into lateral proximal 1/3 of left hip.  LOWER EXTREMITY ROM: Full left knee and hip range of motion.  Crepitus noted with left knee AROM especially into extension.  LOWER EXTREMITY MMT:  Via MMTing left hip and knee is normal strength.  LOWER EXTREMITY SPECIAL TESTS:  Min left knee laxity compared to right.  (-) left hip Scour test. (-) FABER test.  GAIT: WNL.  OBSERVATION:  Left quadriceps atrophy per contralateral comparison (right exhibits excellent muscularity).   TODAY'S TREATMENT:  DATE: 07/27/22:                                     EXERCISE LOG     LT hip/ Knee  Exercise Repetitions and Resistance Comments  Bike X10 mins L2   Bridge 2x10 hold 5 secs   Side lying hip IR 2x10   Side lying hip ER 2x10   Side lying bent knee ABD 2x10   Side lying SLR hip ABD 2x10    Blank cell = exercise not performed today   combo e'stim/US at 1.50 W/CM2 x 8 minutes to patient's left lateral hip musculature with patient in right sdly position with pillow between knees.  Patient tolerated treatment without complaint with normal modality response following removal of modality.  Patient felt good after treatment.   ASSESSMENT:  CLINICAL IMPRESSION: Pt arrived today doing fair, but  with increased hip pain. Rx focused on LT hip strengthening  exercises as well as pain control with [place] combo. Handouts given for HEP. Pt did great with therex, but challenged to fire glutes with bridging at first, but  did well with glute sets before bridging. DN next Rx   OBJECTIVE IMPAIRMENTS: decreased activity tolerance, increased muscle spasms, and pain.   ACTIVITY LIMITATIONS:  Intense exercise.  PERSONAL FACTORS: 1 comorbidity: left ACL reconstruction  are also affecting patient's functional outcome.   REHAB POTENTIAL: Excellent  CLINICAL DECISION MAKING: Stable/uncomplicated  EVALUATION COMPLEXITY: Low   GOALS:   LONG TERM GOALS: Target date: 07/31/12.  Ind with an advanced HEP.  Goal status: MET  2.  Perform ADL's with pain not > 1-2/10.  Goal status: on going  3.  Walk a mile with left hip and knee pain not > 1-2/10.  Goal status: On going  PLAN:  PT FREQUENCY:  2-3 times a week.  PT DURATION: 4 weeks  PLANNED INTERVENTIONS: Therapeutic exercises, Therapeutic activity, Neuromuscular re-education, Patient/Family education, Self Care, Dry Needling, Electrical stimulation, Cryotherapy, Moist heat, Ultrasound, and Manual therapy  PLAN FOR NEXT SESSION: STW/M to left hip, advanced left knee and hip strengthening.  Modalities as needed.   Lysbeth Dicola,CHRIS, PTA 07/27/2022, 10:21 AM

## 2022-07-28 ENCOUNTER — Ambulatory Visit: Payer: No Typology Code available for payment source | Admitting: Physical Therapy

## 2022-07-28 ENCOUNTER — Encounter: Payer: Self-pay | Admitting: Physical Therapy

## 2022-07-28 DIAGNOSIS — M25562 Pain in left knee: Secondary | ICD-10-CM

## 2022-07-28 DIAGNOSIS — M25552 Pain in left hip: Secondary | ICD-10-CM

## 2022-07-28 NOTE — Therapy (Signed)
OUTPATIENT PHYSICAL THERAPY LOWER EXTREMITY EVALUATION   Patient Name: Tonya Franklin MRN: [id number] DOB:11/28/1995, 27 y.o., female Today's Date: 07/28/2022  END OF SESSION:  PT End of Session - 07/28/22 0839     Visit Number 4    Number of Visits 12    Date for PT Re-Evaluation 08/01/22    PT Start Time 0804    PT Stop Time 0833    PT Time Calculation (min) 29 min    Activity Tolerance Patient tolerated treatment well    Behavior During Therapy [facility] for tasks assessed/performed             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   02/08/15 [facility] LGSIL/mild dysplasia, HPV testing not done- never went for colpo, wants to f/u here     Colpo:____    Mood swings 2014   Obesity (BMI 30-39.9) 06/29/2017   Obsessive-compulsive disorder 06/29/2017   Right leg swelling 03/22/2015   Past Surgical History:  Procedure Laterality Date   adnoids     CYST EXCISION     cyst removed from face   Patient Active Problem List   Diagnosis Date Noted   Opioid abuse (HCC) 03/29/2021   Depression, major, single episode, moderate (HCC) 06/10/2020   GAD (generalized anxiety disorder) 06/10/2020   Hypertension 05/28/2018   Obsessive-compulsive disorder 06/29/2017   Alcohol abuse 06/29/2017   Overweight (BMI 25.0-29.9) 06/29/2017   IUD (intrauterine device) in place 10/23/2016   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    REFERRING PROVIDER: Eugenia Mcalpine MD  REFERRING DIAG: S/p ACL reconstruction.  THERAPY DIAG:  No diagnosis found.  Rationale for Evaluation and Treatment: Rehabilitation  ONSET DATE: 07/26/21 (surgery date).  SUBJECTIVE:   SUBJECTIVE STATEMENT: Really hope to get relief from hip pain.  Patient  wanting a short treatment today.  Hoping dry needling will help.  PERTINENT HISTORY: Previous left knee ACL reconstruction. PAIN:  Are you having pain? Yes: NPRS scale: 5/10 Pain location: Left hip/knee. Pain description: Sharp. Aggravating factors: Overactivity. Relieving factors: Rest.  PRECAUTIONS: None  WEIGHT BEARING RESTRICTIONS: No  FALLS:  Has patient fallen in last 6 months? No  LIVING ENVIRONMENT: Lives in: House/apartment Has following equipment at home: None  OCCUPATION: Works from home.  PLOF: Independent  PATIENT GOALS: Get rid of pain.  Workout and be more active.    OBJECTIVE:   PATIENT SURVEYS: FOTO .   PALPATION: Tender to palpation over left lateral hip musculature with referred pain into lateral proximal 1/3 of left hip.  LOWER EXTREMITY ROM: Full left knee and hip range of motion.  Crepitus noted with left knee AROM especially into extension.  LOWER EXTREMITY MMT:  Via MMTing left hip and knee is normal strength.  LOWER EXTREMITY SPECIAL TESTS:  Min left knee laxity compared to right.  (-) left hip Scour test. (-) FABER test.  GAIT: WNL.  OBSERVATION:  Left quadriceps atrophy per contralateral comparison (right exhibits excellent muscularity).   TODAY'S TREATMENT:  DATE:  Trigger Point Dry-Needling  Treatment instructions: Expect mild to moderate muscle soreness. S/S of pneumothorax if dry needled over a lung field, and to seek immediate medical attention should they occur. Patient verbalized understanding of these instructions and education. Patient Consent Given: Yes Education handout provided: Yes Muscles treated: Left TFL and glut med  F/b STW/M x 10 minutes with ischemic release technique utilized.     ASSESSMENT:  CLINICAL IMPRESSION: Patient tolerated DN to her left TFL and glut med without complaint.   She felt good after treatment.   OBJECTIVE IMPAIRMENTS: decreased activity tolerance, increased muscle spasms, and pain.   ACTIVITY LIMITATIONS:  Intense exercise.  PERSONAL FACTORS: 1 comorbidity: left ACL reconstruction  are also affecting patient's functional outcome.   REHAB POTENTIAL: Excellent  CLINICAL DECISION MAKING: Stable/uncomplicated  EVALUATION COMPLEXITY: Low   GOALS:   LONG TERM GOALS: Target date: 07/31/12.  Ind with an advanced HEP.  Goal status: MET  2.  Perform ADL's with pain not > 1-2/10.  Goal status: on going  3.  Walk a mile with left hip and knee pain not > 1-2/10.  Goal status: On going  PLAN:  PT FREQUENCY:  2-3 times a week.  PT DURATION: 4 weeks  PLANNED INTERVENTIONS: Therapeutic exercises, Therapeutic activity, Neuromuscular re-education, Patient/Family education, Self Care, Dry Needling, Electrical stimulation, Cryotherapy, Moist heat, Ultrasound, and Manual therapy  PLAN FOR NEXT SESSION: STW/M to left hip, advanced left knee and hip strengthening.  Modalities as needed.   Demetrie Borge, [place], PT 07/28/2022, 9:37 AM

## 2022-08-22 ENCOUNTER — Ambulatory Visit: Payer: No Typology Code available for payment source | Attending: Specialist | Admitting: *Deleted

## 2022-08-22 DIAGNOSIS — M25562 Pain in left knee: Secondary | ICD-10-CM | POA: Insufficient documentation

## 2022-08-22 DIAGNOSIS — R600 Localized edema: Secondary | ICD-10-CM | POA: Insufficient documentation

## 2022-08-22 DIAGNOSIS — M25552 Pain in left hip: Secondary | ICD-10-CM | POA: Diagnosis present

## 2022-08-22 NOTE — Therapy (Signed)
OUTPATIENT PHYSICAL THERAPY LOWER EXTREMITY TREATMENT   Patient Name: Tonya Franklin MRN: [id number] DOB:[birth year], 27 y.o., female Today's Date: 08/22/2022  END OF SESSION:  PT End of Session - 08/22/22 0835     Visit Number 5    Number of Visits 12    Date for PT Re-Evaluation 08/01/22    PT Start Time 0801    PT Stop Time 0830    PT Time Calculation (min) 29 min              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   02/08/15 [facility] [place] LGSIL/mild dysplasia, HPV testing not done- never went for colpo, wants to f/u here     Colpo:____    Mood swings 2014   Obesity (BMI 30-39.9) 06/29/2017   Obsessive-compulsive disorder 06/29/2017   Right leg swelling 03/22/2015   Past Surgical History:  Procedure Laterality Date   adnoids     CYST EXCISION     cyst removed from face   Patient Active Problem List   Diagnosis Date Noted   Opioid abuse (HCC) 03/29/2021   Depression, major, single episode, moderate (HCC) 06/10/2020   GAD (generalized anxiety disorder) 06/10/2020   Hypertension 05/28/2018   Obsessive-compulsive disorder 06/29/2017   Alcohol abuse 06/29/2017   Overweight (BMI 25.0-29.9) 06/29/2017   IUD (intrauterine device) in place 10/23/2016   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    REFERRING PROVIDER: Eugenia Mcalpine MD  REFERRING DIAG: S/p ACL reconstruction.  THERAPY DIAG:  Pain in left hip  Acute pain of left knee  Localized edema  Rationale for Evaluation and Treatment: Rehabilitation  ONSET DATE: 07/26/21 (surgery date).  SUBJECTIVE:   SUBJECTIVE STATEMENT:    Patient wanting a short treatment today. Need to leave by 8:30. Did good after DN.Over did it the other day and now it  hurts again 5/10  PERTINENT HISTORY: Previous left knee ACL reconstruction. PAIN:  Are you having pain? Yes: NPRS scale: 5/10 Pain location: Left hip/knee. Pain description: Sharp. Aggravating factors: Overactivity. Relieving factors: Rest.  PRECAUTIONS: None  WEIGHT BEARING RESTRICTIONS: No  FALLS:  Has patient fallen in last 6 months? No  LIVING ENVIRONMENT: Lives in: House/apartment Has following equipment at home: None  OCCUPATION: Works from home.  PLOF: Independent  PATIENT GOALS: Get rid of pain.  Workout and be more active.    OBJECTIVE:   PATIENT SURVEYS: FOTO .   PALPATION: Tender to palpation over left lateral hip musculature with referred pain into lateral proximal 1/3 of left hip.  LOWER EXTREMITY ROM: Full left knee and hip range of motion.  Crepitus noted with left knee AROM especially into extension.  LOWER EXTREMITY MMT:  Via MMTing left hip and knee is normal strength.  LOWER EXTREMITY SPECIAL TESTS:  Min left knee laxity compared to right.  (-) left hip Scour test. (-) FABER test.  GAIT: WNL.  OBSERVATION:  Left quadriceps atrophy per contralateral comparison (right exhibits excellent muscularity).   TODAY'S TREATMENT:  DATE:                                             08-22-22 Bridges 2x10 pause 5 secs SL clam shell 2x10 SL hip ABD 2x10   STW/M x 10 minutes with ischemic release technique utilized to LT glut, Piriformis, and TFL  Priiformis stretch and HS stretch after STW   ASSESSMENT:  CLINICAL IMPRESSION: Rx focused on Hip strengthening exs as well as STW to decrease Tp's f/b hip stretching. Pt felt great end of Rx with good TP releases. Piriformis with greatest release.   OBJECTIVE IMPAIRMENTS: decreased activity tolerance, increased muscle spasms, and pain.   ACTIVITY LIMITATIONS:  Intense  exercise.  PERSONAL FACTORS: 1 comorbidity: left ACL reconstruction  are also affecting patient's functional outcome.   REHAB POTENTIAL: Excellent  CLINICAL DECISION MAKING: Stable/uncomplicated  EVALUATION COMPLEXITY: Low   GOALS:   LONG TERM GOALS: Target date: 07/31/12.  Ind with an advanced HEP.  Goal status: MET  2.  Perform ADL's with pain not > 1-2/10.  Goal status: on going  3.  Walk a mile with left hip and knee pain not > 1-2/10.  Goal status: On going  PLAN:  PT FREQUENCY:  2-3 times a week.  PT DURATION: 4 weeks  PLANNED INTERVENTIONS: Therapeutic exercises, Therapeutic activity, Neuromuscular re-education, Patient/Family education, Self Care, Dry Needling, Electrical stimulation, Cryotherapy, Moist heat, Ultrasound, and Manual therapy  PLAN FOR NEXT SESSION: STW/M to left hip, advanced left knee and hip strengthening.  Modalities as needed.   Mersadez Linden,CHRIS, PTA 08/22/2022, 8:36 AM

## 2022-10-05 ENCOUNTER — Encounter: Payer: Self-pay | Admitting: Family

## 2022-10-05 ENCOUNTER — Other Ambulatory Visit: Payer: No Typology Code available for payment source

## 2022-10-05 ENCOUNTER — Ambulatory Visit (INDEPENDENT_AMBULATORY_CARE_PROVIDER_SITE_OTHER): Payer: No Typology Code available for payment source | Admitting: Family

## 2022-10-05 VITALS — BP 119/74 | HR 70 | Temp 98.6°F | Ht 64.0 in | Wt 166.2 lb

## 2022-10-05 DIAGNOSIS — F411 Generalized anxiety disorder: Secondary | ICD-10-CM | POA: Diagnosis not present

## 2022-10-05 DIAGNOSIS — R002 Palpitations: Secondary | ICD-10-CM

## 2022-10-05 DIAGNOSIS — J209 Acute bronchitis, unspecified: Secondary | ICD-10-CM

## 2022-10-05 DIAGNOSIS — F321 Major depressive disorder, single episode, moderate: Secondary | ICD-10-CM

## 2022-10-05 DIAGNOSIS — F410 Panic disorder [episodic paroxysmal anxiety] without agoraphobia: Secondary | ICD-10-CM

## 2022-10-05 DIAGNOSIS — K625 Hemorrhage of anus and rectum: Secondary | ICD-10-CM | POA: Diagnosis not present

## 2022-10-05 DIAGNOSIS — F418 Other specified anxiety disorders: Secondary | ICD-10-CM

## 2022-10-05 DIAGNOSIS — J301 Allergic rhinitis due to pollen: Secondary | ICD-10-CM

## 2022-10-05 DIAGNOSIS — R140 Abdominal distension (gaseous): Secondary | ICD-10-CM

## 2022-10-05 DIAGNOSIS — M546 Pain in thoracic spine: Secondary | ICD-10-CM

## 2022-10-05 MED ORDER — AZITHROMYCIN 250 MG PO TABS
ORAL_TABLET | ORAL | 0 refills | Status: DC
Start: 2022-10-05 — End: 2023-03-06

## 2022-10-05 MED ORDER — CETIRIZINE HCL 10 MG PO TABS
10.0000 mg | ORAL_TABLET | Freq: Every day | ORAL | 1 refills | Status: AC
Start: 2022-10-05 — End: ?

## 2022-10-05 MED ORDER — HYDROXYZINE PAMOATE 25 MG PO CAPS
ORAL_CAPSULE | ORAL | 0 refills | Status: AC
Start: 2022-10-05 — End: ?

## 2022-10-05 MED ORDER — ALBUTEROL SULFATE HFA 108 (90 BASE) MCG/ACT IN AERS
INHALATION_SPRAY | RESPIRATORY_TRACT | 2 refills | Status: AC
Start: 2022-10-05 — End: ?

## 2022-10-05 MED ORDER — FLUOXETINE HCL 20 MG PO TABS: 60.0000 mg | ORAL_TABLET | Freq: Every day | ORAL | 0 refills | Status: DC

## 2022-10-05 MED ORDER — BACLOFEN 10 MG PO TABS
10.0000 mg | ORAL_TABLET | Freq: Three times a day (TID) | ORAL | 0 refills | Status: DC
Start: 2022-10-05 — End: 2023-03-06

## 2022-10-05 MED ORDER — PREDNISONE 10 MG (21) PO TBPK
ORAL_TABLET | ORAL | 0 refills | Status: DC
Start: 2022-10-05 — End: 2023-03-06

## 2022-10-05 NOTE — Progress Notes (Signed)
Subjective:    Patient ID: Tonya Franklin, female    DOB: 1995-05-19, 27 y.o.   MRN: [id number]  Chief Complaint  Patient presents with   Rectal Bleeding    Blood in stools patient states she is always bloated and stomach. Patient states her HR will drop to 39 and go to 203 the highest she seen it,   Medical Management of Chronic Issues    Wants MRI she feels like something is going on she just does not know what it is. She wants it from head and toe. Constant sore throat and mucus. Neck movement is restricted feels like it is in the middle of shoulder blade,   Pt presents to the office today with multiple complaints.   She reports she has had rectal bleeding on and off the last few months. Does report constipation at times with hard stools. Does not take anything for her bowels. Complaining of bloating, decrease appetite, and generalized abdominal pain.   She reports bradycardia and tachycardia. Reports she will be sitting on her couch and will look at her apple watch and has noticed her heart rate will be from a range of 39-203. This is all from her apple watch. Heart rate today is normal.  Rectal Bleeding  The current episode started more than 1 week ago. The problem occurs occasionally. The problem has been unchanged. The pain is mild. The stool is described as streaked with blood. Prior successful therapies include diet changes. Associated symptoms include coughing.  Cough This is a new problem. The current episode started more than 1 month ago. The problem has been waxing and waning. The problem occurs every few minutes. Associated symptoms include ear congestion, ear pain, nasal congestion, rhinorrhea, a sore throat, shortness of breath and wheezing. She has tried rest, OTC cough suppressant and OTC inhaler for the symptoms. The treatment provided mild relief.  Anxiety Presents for follow-up visit. Symptoms include depressed mood, excessive worry, irritability, nervous/anxious  behavior, restlessness and shortness of breath. Symptoms occur most days. The severity of symptoms is moderate.    Back Pain This is a new problem. The current episode started more than 1 month ago. The problem occurs intermittently. The problem has been waxing and waning since onset. The pain is present in the thoracic spine. The quality of the pain is described as aching. The pain is at a severity of 8/10. The pain is moderate. Risk factors include obesity. She has tried bed rest for the symptoms. The treatment provided mild relief.      Review of Systems  Constitutional:  Positive for irritability.  HENT:  Positive for ear pain, rhinorrhea and sore throat.   Respiratory:  Positive for cough, shortness of breath and wheezing.   Gastrointestinal:  Positive for hematochezia.  Musculoskeletal:  Positive for back pain.  Psychiatric/Behavioral:  The patient is nervous/anxious.   All other systems reviewed and are negative.  Family History  Problem Relation Age of Onset   Diabetes Father    Hyperlipidemia Father    Hypertension Father    Cancer Paternal Grandmother        lung   Cancer Other        breast   Cancer Other        ovarian and cervical   Social History   Socioeconomic History   Marital status: Single    Spouse name: Not on file   Number of children: Not on file   Years of education: Not on file  Highest education level: Not on file  Occupational History   Not on file  Tobacco Use   Smoking status: Some Days    Current packs/day: 0.00    Types: Cigarettes    Last attempt to quit: 03/19/2014    Years since quitting: 8.5   Smokeless tobacco: Never  Vaping Use   Vaping status: Never Used  Substance and Sexual Activity   Alcohol use: No    Comment: occ   Drug use: No    Comment: occ   Sexual activity: Yes    Birth control/protection: Condom  Other Topics Concern   Not on file  Social History Narrative   Not on file   Social Determinants of Health    Financial Resource Strain: Not on file  Food Insecurity: Not on file  Transportation Needs: Not on file  Physical Activity: Not on file  Stress: Not on file  Social Connections: Not on file       Objective:   Physical Exam Vitals reviewed.  Constitutional:      General: She is not in acute distress.    Appearance: She is well-developed.  HENT:     Head: Normocephalic and atraumatic.     Right Ear: External ear normal.     Mouth/Throat:     Pharynx: Posterior oropharyngeal erythema present.  Eyes:     Pupils: Pupils are equal, round, and reactive to light.  Neck:     Thyroid: No thyromegaly.  Cardiovascular:     Rate and Rhythm: Normal rate and regular rhythm.     Heart sounds: Normal heart sounds. No murmur heard. Pulmonary:     Effort: Pulmonary effort is normal. No respiratory distress.     Breath sounds: Normal breath sounds. No wheezing.  Abdominal:     General: Bowel sounds are normal. There is no distension.     Palpations: Abdomen is soft.     Tenderness: There is no abdominal tenderness.  Musculoskeletal:        General: No tenderness. Normal range of motion.     Cervical back: Normal range of motion and neck supple.     Comments: Thoracic pain with flexion and extension  Skin:    General: Skin is warm and dry.  Neurological:     Mental Status: She is alert and oriented to person, place, and time.     Cranial Nerves: No cranial nerve deficit.     Deep Tendon Reflexes: Reflexes are normal and symmetric.  Psychiatric:        Mood and Affect: Mood is anxious.        Behavior: Behavior normal.        Thought Content: Thought content normal.        Judgment: Judgment normal.     BP 119/74   Pulse 70   Temp 98.6 F (37 C) (Temporal)   Ht 5\' 4"  (1.626 m)   Wt 166 lb 3.2 oz (75.4 kg)   SpO2 99%   BMI 28.53 kg/m      Assessment & Plan:   Kinly Daube Holway comes in today with chief complaint of Rectal Bleeding (Blood in stools patient states she is  always bloated and stomach. Patient states her HR will drop to 39 and go to 203 the highest she seen it,) and Medical Management of Chronic Issues (Wants MRI she feels like something is going on she just does not know what it is. She wants it from head and toe. Constant sore throat  and mucus. Neck movement is restricted feels like it is in the middle of shoulder blade,)   Diagnosis and orders addressed:  1. GAD (generalized anxiety disorder) - FLUoxetine (PROZAC) 20 MG tablet; Take 3 tablets (60 mg total) by mouth daily.  Dispense: 270 tablet; Refill: 0 - CMP14+EGFR  2. Depression, major, single episode, moderate (HCC) - FLUoxetine (PROZAC) 20 MG tablet; Take 3 tablets (60 mg total) by mouth daily.  Dispense: 270 tablet; Refill: 0 - CMP14+EGFR  3. Panic attack - hydrOXYzine (VISTARIL) 25 MG capsule; TAKE 1 CAPSULE BY MOUTH THREE TIMES A DAY AS NEEDED  Dispense: 270 capsule; Refill: 0 - CMP14+EGFR  4. Depression with anxiety - hydrOXYzine (VISTARIL) 25 MG capsule; TAKE 1 CAPSULE BY MOUTH THREE TIMES A DAY AS NEEDED  Dispense: 270 capsule; Refill: 0 - CMP14+EGFR  5. Rectal bleed Take daily stool softener  Force fluids - Ambulatory referral to Gastroenterology - CBC with Differential/Platelet - CMP14+EGFR  6. Bloating - Ambulatory referral to Gastroenterology - CMP14+EGFR  7. Palpitations Zio placed, if abnormal will place referral to Cardiologists  - Ambulatory referral to Gastroenterology - EKG 12-Lead - LONG TERM MONITOR (3-14 DAYS); Future - TSH - CMP14+EGFR  8. Acute bronchitis, unspecified organism - Take meds as prescribed - Use a cool mist humidifier  -Use saline nose sprays frequently -Force fluids -For any cough or congestion  Use plain Mucinex- regular strength or max strength is fine -For fever or aces or pains- take tylenol or ibuprofen. -Throat lozenges if help -Follow up if symptoms worsen  - cetirizine (ZYRTEC ALLERGY) 10 MG tablet; Take 1 tablet (10 mg  total) by mouth daily.  Dispense: 90 tablet; Refill: 1 - albuterol (VENTOLIN HFA) 108 (90 Base) MCG/ACT inhaler; TAKE 2 PUFFS BY MOUTH EVERY 6 HOURS AS NEEDED FOR WHEEZE OR SHORTNESS OF BREATH  Dispense: 8.5 each; Refill: 2 - predniSONE (STERAPRED UNI-PAK 21 TAB) 10 MG (21) TBPK tablet; Use as directed  Dispense: 21 tablet; Refill: 0 - azithromycin (ZITHROMAX) 250 MG tablet; Take 500 mg once, then 250 mg for four days  Dispense: 6 tablet; Refill: 0 - CMP14+EGFR  9. Allergic rhinitis due to pollen, unspecified seasonality - CMP14+EGFR  10. Acute bilateral thoracic back pain Start baclofen  Avoid NSAIDs at this time given rectal bleeding  - baclofen (LIORESAL) 10 MG tablet; Take 1 tablet (10 mg total) by mouth 3 (three) times daily.  Dispense: 30 each; Refill: 0   Labs pending Continue medications  Health Maintenance reviewed Diet and exercise encouraged Approx 50 mins spent with patient, charting, chart review, reviewing tests.    Follow up plan: 1 months    [name], FNP

## 2022-10-05 NOTE — Patient Instructions (Signed)
Palpitations Palpitations are feelings that your heartbeat is irregular or is faster than normal. It may feel like your heart is fluttering or skipping a beat. Palpitations may be caused by many things, including smoking, caffeine, alcohol, stress, and certain medicines or drugs. Most causes of palpitations are not serious.  However, some palpitations can be a sign of a serious problem. Further tests and a thorough medical history will be done to find the cause of your palpitations. Your provider may order tests such as an ECG, labs, an echocardiogram, or an ambulatory continuous ECG monitor. Follow these instructions at home: Pay attention to any changes in your symptoms. Let your health care provider know about them. Take these actions to help manage your symptoms: Eating and drinking Follow instructions from your health care provider about eating or drinking restrictions. You may need to avoid foods and drinks that may cause palpitations. These may include: Caffeinated coffee, tea, soft drinks, and energy drinks. Chocolate. Alcohol. Diet pills. Lifestyle     Take steps to reduce your stress and anxiety. Things that can help you relax include: Yoga. Mind-body activities, such as deep breathing, meditation, or using words and images to create positive thoughts (guided imagery). Physical activity, such as swimming, jogging, or walking. Tell your health care provider if your palpitations increase with activity. If you have chest pain or shortness of breath with activity, do not continue the activity until you are seen by your health care provider. Biofeedback. This is a method that helps you learn to use your mind to control things in your body, such as your heartbeat. Get plenty of rest and sleep. Keep a regular bed time. Do not use drugs, including cocaine or ecstasy. Do not use marijuana. Do not use any products that contain nicotine or tobacco. These products include cigarettes, chewing  tobacco, and vaping devices, such as e-cigarettes. If you need help quitting, ask your health care provider. General instructions Take over-the-counter and prescription medicines only as told by your health care provider. Keep all follow-up visits. This is important. These may include visits for further testing if palpitations do not go away or get worse. Contact a health care provider if: You continue to have a fast or irregular heartbeat for a long period of time. You notice that your palpitations occur more often. Get help right away if: You have chest pain or shortness of breath. You have a severe headache. You feel dizzy or you faint. These symptoms may represent a serious problem that is an emergency. Do not wait to see if the symptoms will go away. Get medical help right away. Call your local emergency services (911 in the [place] Do not drive yourself to the hospital. Summary Palpitations are feelings that your heartbeat is irregular or is faster than normal. It may feel like your heart is fluttering or skipping a beat. Palpitations may be caused by many things, including smoking, caffeine, alcohol, stress, certain medicines, and drugs. Further tests and a thorough medical history may be done to find the cause of your palpitations. Get help right away if you faint or have chest pain, shortness of breath, severe headache, or dizziness. This information is not intended to replace advice given to you by your health care provider. Make sure you discuss any questions you have with your health care provider. Document Revised: 06/09/2020 Document Reviewed: 06/09/2020 Elsevier Patient Education  2024 [employer].

## 2022-10-06 LAB — CBC WITH DIFFERENTIAL/PLATELET
Basophils Absolute: 0 10*3/uL (ref 0.0–0.2)
Basos: 0 %
EOS (ABSOLUTE): 0.1 10*3/uL (ref 0.0–0.4)
Eos: 2 %
Hematocrit: 43.1 % (ref 34.0–46.6)
Hemoglobin: 14.1 g/dL (ref 11.1–15.9)
Immature Grans (Abs): 0 10*3/uL (ref 0.0–0.1)
Immature Granulocytes: 0 %
Lymphocytes Absolute: 2.2 10*3/uL (ref 0.7–3.1)
Lymphs: 31 %
MCH: 29.9 pg (ref 26.6–33.0)
MCHC: 32.7 g/dL (ref 31.5–35.7)
MCV: 91 fL (ref 79–97)
Monocytes Absolute: 0.6 10*3/uL (ref 0.1–0.9)
Monocytes: 8 %
Neutrophils Absolute: 4.2 10*3/uL (ref 1.4–7.0)
Neutrophils: 59 %
Platelets: 239 10*3/uL (ref 150–450)
RBC: 4.72 x10E6/uL (ref 3.77–5.28)
RDW: 12.4 % (ref 11.7–15.4)
WBC: 7.1 10*3/uL (ref 3.4–10.8)

## 2022-10-06 LAB — CMP14+EGFR
ALT: 13 IU/L (ref 0–32)
AST: 16 IU/L (ref 0–40)
Albumin: 4.4 g/dL (ref 4.0–5.0)
Alkaline Phosphatase: 56 IU/L (ref 44–121)
BUN/Creatinine Ratio: 10 (ref 9–23)
BUN: 9 mg/dL (ref 6–20)
Bilirubin Total: 0.2 mg/dL (ref 0.0–1.2)
CO2: 20 mmol/L (ref 20–29)
Calcium: 9.1 mg/dL (ref 8.7–10.2)
Chloride: 102 mmol/L (ref 96–106)
Creatinine, Ser: 0.86 mg/dL (ref 0.57–1.00)
Globulin, Total: 2 g/dL (ref 1.5–4.5)
Glucose: 89 mg/dL (ref 70–99)
Potassium: 4.3 mmol/L (ref 3.5–5.2)
Sodium: 139 mmol/L (ref 134–144)
Total Protein: 6.4 g/dL (ref 6.0–8.5)
eGFR: 94 mL/min/{1.73_m2} (ref >59)

## 2022-10-06 LAB — TSH: TSH: 1.1 u[IU]/mL (ref 0.450–4.500)

## 2022-12-21 ENCOUNTER — Ambulatory Visit: Payer: Self-pay | Admitting: Family

## 2022-12-21 ENCOUNTER — Telehealth: Payer: Self-pay | Admitting: Family Medicine

## 2022-12-21 NOTE — Telephone Encounter (Signed)
Pt aware hawks is not here today and states she did not want to discuss with me

## 2022-12-21 NOTE — Telephone Encounter (Signed)
Copied from CRM [id number]. Topic: Clinical - Red Word Triage >> Dec 21, 2022  2:44 PM [name] wrote: Red Word that prompted transfer to Nurse Triage: Pt calling in because she's dealing with anxiety & depression. Appt has been made with PCP already.   Chief Complaint: Anxiety and Depression  Symptoms: "not feeling like myself" Frequency: n/a Pertinent Negatives: Patient denies Disposition: [] ED /[] Urgent Care (no appt availability in office) / [x] Appointment(In office/virtual)/ []  [facility]/ [] Home Care/ [] Refused Recommended Disposition /[] [facility]/ []  Follow-up with PCP Additional Notes: Patient called to speak with [name], FNP at [facility]. States that she has an appt on 11/26,but wanted to ask [name] "something" and discuss her FMLA paperwork   Reason for Disposition  [1] Other NON-URGENT information for PCP AND [2] does not require PCP response  Answer Assessment - Initial Assessment Questions 1. REASON FOR CALL or QUESTION: "What is your reason for calling today?" or "How can I best help you?" or "What question do you have that I can help answer?"     Patient requesting [name], FNP to give her a call before her appt, if possible today. States that she wanted to talk to her about her FMLA paperwork  2. CALLER: Document the source of call. (e.g., laboratory, patient).     Patient  Protocols used: PCP Call - No Triage-A-AH

## 2022-12-21 NOTE — Telephone Encounter (Signed)
Called and spoke with patient she is aware christy is off today she would not give me any information and would only Speak with [facility]

## 2022-12-21 NOTE — Telephone Encounter (Signed)
Copied from CRM [id number]. Topic: General - Other >> Dec 21, 2022  2:50 PM [name] wrote: Reason for CRM: Patient is requesting to speak with Dr. Lendon Colonel. Asked what was the reason & she stated that she would just like for her to give her a call. States it's in regards to mental health. Pt has already made an appt to see provider. Pt's CB # [phone].

## 2022-12-22 NOTE — Telephone Encounter (Signed)
Called to let patient know christy is

## 2022-12-22 NOTE — Telephone Encounter (Signed)
Called and and answered all questions

## 2022-12-26 ENCOUNTER — Ambulatory Visit: Payer: No Typology Code available for payment source | Admitting: Family

## 2023-01-01 ENCOUNTER — Ambulatory Visit: Payer: No Typology Code available for payment source

## 2023-01-10 ENCOUNTER — Ambulatory Visit: Payer: No Typology Code available for payment source | Admitting: Physician Assistant

## 2023-01-10 NOTE — Progress Notes (Unsigned)
01/10/2023 Tonya Franklin [id number] 03-Dec-1995  Referring provider: Junie Spencer, FNP Primary GI doctor: Orvan Falconer)  ASSESSMENT AND PLAN:   Assessment and Plan          Rectal bleeding  Abdominal bloating  B12 deficiency  Palpitations    Patient Care Team: Junie Spencer, FNP as PCP - General (Family Medicine)  HISTORY OF PRESENT ILLNESS: 27 y.o. female with a past medical history of ***and others listed below presents for evaluation of ***.  For/30/2020 video visit due to pandemic with Dr. Orvan Falconer for diarrhea with abdominal discomfort, intermittent painless bleeding strong family history of symptomatic gallbladder disease Patient was prescribed Imodium was scheduled for EGD and colonoscopy however this was never done.  Patient was also supposed to have HIDA scan sed rate CRP, Giardia testing fecal calprotectin and thyroid but this was never done. 05/21/2018 RUQ [place] normal right upper quadrant ultrasound  A year ago had B12 deficiency with B12 of 230 started on B12 injections.  Previous anemia panel in 2020 did show low iron saturations with ferritin below 30 normal iron.  10/05/2022 office visit with primary care with multitude of symptoms including blood in stools, abdominal bloating and discomfort.  Bradycardia down to 39 with tachycardia 203.  Sore throat, mucus, neck pain. Patient was prescribed Z-Pak, prednisone, baclofen and Zyrtec 10/05/2022 CBC without anemia or leukocytosis, normal thyroid, normal EKG set up for Holter monitor.  Discussed the use of AI scribe software for clinical note transcription with the patient, who gave verbal consent to proceed.  History of Present Illness             She {Actions; denies-reports:120008} blood thinner use.  She {Actions; denies-reports:120008} NSAID use.  She {Actions; denies-reports:120008} ETOH use.   She {Actions; denies-reports:120008} tobacco use.  She {Actions; denies-reports:120008} drug use.     She  reports that she has been smoking cigarettes. She has never used smokeless tobacco. She reports that she does not drink alcohol and does not use drugs.  RELEVANT LABS AND IMAGING:  Results          CBC    Component Value Date/Time   WBC 7.1 10/05/2022 1204   WBC 7.1 05/27/2018 1139   RBC 4.72 10/05/2022 1204   RBC 4.26 05/27/2018 1139   HGB 14.1 10/05/2022 1204   HCT 43.1 10/05/2022 1204   PLT 239 10/05/2022 1204   MCV 91 10/05/2022 1204   MCH 29.9 10/05/2022 1204   MCH 31.0 05/27/2018 1139   MCHC 32.7 10/05/2022 1204   MCHC 33.6 05/27/2018 1139   RDW 12.4 10/05/2022 1204   LYMPHSABS 2.2 10/05/2022 1204   MONOABS 0.4 05/27/2018 1139   EOSABS 0.1 10/05/2022 1204   BASOSABS 0.0 10/05/2022 1204   Recent Labs    05/30/22 1243 10/05/22 1204  HGB 13.5 14.1    CMP     Component Value Date/Time   NA 139 10/05/2022 1204   K 4.3 10/05/2022 1204   CL 102 10/05/2022 1204   CO2 20 10/05/2022 1204   GLUCOSE 89 10/05/2022 1204   GLUCOSE 98 05/27/2018 1139   BUN 9 10/05/2022 1204   CREATININE 0.86 10/05/2022 1204   CALCIUM 9.1 10/05/2022 1204   PROT 6.4 10/05/2022 1204   ALBUMIN 4.4 10/05/2022 1204   AST 16 10/05/2022 1204   ALT 13 10/05/2022 1204   ALKPHOS 56 10/05/2022 1204   BILITOT 0.2 10/05/2022 1204   GFRNONAA 104 03/05/2020 0906   GFRAA 120 03/05/2020 0906  Latest Ref Rng & Units 10/05/2022   12:04 PM 05/30/2022   12:43 PM 09/08/2021   10:50 AM  Hepatic Function  Total Protein 6.0 - 8.5 g/dL 6.4  6.4  6.4   Albumin 4.0 - 5.0 g/dL 4.4  4.3  4.3   AST 0 - 40 IU/L 16  13  15    ALT 0 - 32 IU/L 13  19  19    Alk Phosphatase 44 - 121 IU/L 56  58  63   Total Bilirubin 0.0 - 1.2 mg/dL 0.2  0.2  <6.0       Current Medications:   Current Outpatient Medications (Endocrine & Metabolic):    levonorgestrel (MIRENA) 20 MCG/24HR IUD, by Intrauterine route.   predniSONE (STERAPRED UNI-PAK 21 TAB) 10 MG (21) TBPK tablet, Use as directed   Current Outpatient  Medications (Respiratory):    albuterol (VENTOLIN HFA) 108 (90 Base) MCG/ACT inhaler, TAKE 2 PUFFS BY MOUTH EVERY 6 HOURS AS NEEDED FOR WHEEZE OR SHORTNESS OF BREATH   cetirizine (ZYRTEC ALLERGY) 10 MG tablet, Take 1 tablet (10 mg total) by mouth daily.   fluticasone (FLONASE) 50 MCG/ACT nasal spray, Place 2 sprays into both nostrils daily.    Current Outpatient Medications (Other):    azithromycin (ZITHROMAX) 250 MG tablet, Take 500 mg once, then 250 mg for four days   baclofen (LIORESAL) 10 MG tablet, Take 1 tablet (10 mg total) by mouth 3 (three) times daily.   FLUoxetine (PROZAC) 20 MG tablet, Take 3 tablets (60 mg total) by mouth daily.   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 Family History:  Her family history includes Cancer in her paternal grandmother and other family members; Diabetes in her father; Hyperlipidemia in her father; Hypertension in her father.  REVIEW OF SYSTEMS  : All other systems reviewed and negative except where noted in the History of Present Illness.  PHYSICAL EXAM: There were no vitals taken for this visit. General Appearance: Well nourished, in no apparent  distress. Head:   Normocephalic and atraumatic. Eyes:  sclerae anicteric,conjunctive pink  Respiratory: Respiratory effort normal, BS equal bilaterally without rales, rhonchi, wheezing. Cardio: RRR with no MRGs. Peripheral pulses intact.  Abdomen: Soft,  {BlankSingle:19197::"Flat","Obese","Non-distended"} ,active bowel sounds. {actendernessAB:27319} tenderness {anatomy; site abdomen:5010}. {BlankMultiple:19196::"Without guarding","With guarding","Without rebound","With rebound"}. No masses. Rectal: {acrectalexam:27461} Musculoskeletal: Full ROM, {PSY - GAIT AND STATION:22860} gait. {With/Without:304960234} edema. Skin:  Dry and intact without significant lesions or rashes Neuro: Alert and  oriented x4;  No focal deficits. Psych:  Cooperative. Normal mood and affect.    Doree Albee, PA-C 12:03 PM

## 2023-03-06 ENCOUNTER — Ambulatory Visit (INDEPENDENT_AMBULATORY_CARE_PROVIDER_SITE_OTHER): Payer: No Typology Code available for payment source | Admitting: Family

## 2023-03-06 ENCOUNTER — Encounter: Payer: Self-pay | Admitting: Family

## 2023-03-06 VITALS — BP 108/69 | HR 66 | Temp 98.2°F | Ht 64.0 in | Wt 148.6 lb

## 2023-03-06 DIAGNOSIS — M546 Pain in thoracic spine: Secondary | ICD-10-CM

## 2023-03-06 DIAGNOSIS — N926 Irregular menstruation, unspecified: Secondary | ICD-10-CM | POA: Diagnosis not present

## 2023-03-06 DIAGNOSIS — R634 Abnormal weight loss: Secondary | ICD-10-CM | POA: Diagnosis not present

## 2023-03-06 DIAGNOSIS — Z975 Presence of (intrauterine) contraceptive device: Secondary | ICD-10-CM

## 2023-03-06 MED ORDER — DICLOFENAC SODIUM 75 MG PO TBEC
75.0000 mg | DELAYED_RELEASE_TABLET | Freq: Two times a day (BID) | ORAL | 2 refills | Status: AC
Start: 2023-03-06 — End: ?

## 2023-03-06 MED ORDER — BACLOFEN 10 MG PO TABS
10.0000 mg | ORAL_TABLET | Freq: Three times a day (TID) | ORAL | 0 refills | Status: DC
Start: 2023-03-06 — End: 2023-05-24

## 2023-03-06 NOTE — Progress Notes (Signed)
 Subjective:    Patient ID: Tonya Franklin, female    DOB: 1995/04/27, 28 y.o.   MRN: [id number]  Chief Complaint  Patient presents with   Back Pain   PT presents to the office today with several complaints.   Reports she having thoracic back pain that started over two weeks ago. Denies any injury.   She reports she has had weight loss over the last 6 months. Reports she has lost 18 lbs. She states she has not changed anything in her activity. She reports she will binge eat at times and then some days not eat anything.      03/06/2023    3:32 PM 10/05/2022   11:14 AM 05/30/2022   12:17 PM  Last 3 Weights  Weight (lbs) 148 lb 9.6 oz 166 lb 3.2 oz 166 lb 9.6 oz  Weight (kg) 67.405 kg 75.388 kg 75.569 kg    She has an IUD (8 years ago), but having irregular bleeding that started over a year ago. She reports she will bleed for 2 weeks, then stop for a week and then bleed again.   Back Pain This is a new problem. The current episode started 1 to 4 weeks ago. The problem has been waxing and waning since onset. The pain is present in the thoracic spine. The pain is at a severity of 5/10. The pain is mild. The symptoms are aggravated by twisting and bending. She has tried bed rest for the symptoms. The treatment provided mild relief.      Review of Systems  Musculoskeletal:  Positive for back pain.  All other systems reviewed and are negative.   Social History   Socioeconomic History   Marital status: Single    Spouse name: Not on file   Number of children: Not on file   Years of education: Not on file   Highest education level: Not on file  Occupational History   Not on file  Tobacco Use   Smoking status: Some Days    Current packs/day: 0.00    Types: Cigarettes    Last attempt to quit: 03/19/2014    Years since quitting: 8.9   Smokeless tobacco: Never  Vaping Use   Vaping status: Never Used  Substance and Sexual Activity   Alcohol use: No    Comment: occ   Drug use:  No    Comment: occ   Sexual activity: Yes    Birth control/protection: Condom  Other Topics Concern   Not on file  Social History Narrative   Not on file   Social Drivers of Health   Financial Resource Strain: Not on file  Food Insecurity: Not on file  Transportation Needs: Not on file  Physical Activity: Not on file  Stress: Not on file  Social Connections: Not on file   Family History  Problem Relation Age of Onset   Diabetes Father    Hyperlipidemia Father    Hypertension Father    Cancer Paternal Grandmother        lung   Cancer Other        breast   Cancer Other        ovarian and cervical        Objective:   Physical Exam Vitals reviewed.  Constitutional:      General: She is not in acute distress.    Appearance: She is well-developed.  HENT:     Head: Normocephalic and atraumatic.     Right Ear: Tympanic membrane  normal.     Left Ear: Tympanic membrane normal.  Eyes:     Pupils: Pupils are equal, round, and reactive to light.  Neck:     Thyroid: No thyromegaly.  Cardiovascular:     Rate and Rhythm: Normal rate and regular rhythm.     Heart sounds: Normal heart sounds. No murmur heard. Pulmonary:     Effort: Pulmonary effort is normal. No respiratory distress.     Breath sounds: Normal breath sounds. No wheezing.  Abdominal:     General: Bowel sounds are normal. There is no distension.     Palpations: Abdomen is soft.     Tenderness: There is no abdominal tenderness.  Musculoskeletal:        General: No tenderness. Normal range of motion.     Cervical back: Normal range of motion and neck supple.     Comments: Full ROM of back,   Skin:    General: Skin is warm and dry.  Neurological:     Mental Status: She is alert and oriented to person, place, and time.     Cranial Nerves: No cranial nerve deficit.     Deep Tendon Reflexes: Reflexes are normal and symmetric.  Psychiatric:        Mood and Affect: Mood is anxious.        Behavior: Behavior  normal.        Thought Content: Thought content normal.        Judgment: Judgment normal.       BP 108/69   Pulse 66   Temp 98.2 F (36.8 C)   Ht 5' 4 (1.626 m)   Wt 148 lb 9.6 oz (67.4 kg)   SpO2 97%   BMI 25.51 kg/m      Assessment & Plan:  Tonya Franklin comes in today with chief complaint of Back Pain   Diagnosis and orders addressed:  1. Acute bilateral thoracic back pain (Primary) Rest ROM exercises encouraged- hand out given  Tylenol  as needed Diclofeanc BID- no other NSAID's Referral to PT - baclofen  (LIORESAL ) 10 MG tablet; Take 1 tablet (10 mg total) by mouth 3 (three) times daily.  Dispense: 30 each; Refill: 0 - Ambulatory referral to Physical Therapy - diclofenac  (VOLTAREN ) 75 MG EC tablet; Take 1 tablet (75 mg total) by mouth 2 (two) times daily.  Dispense: 60 tablet; Refill: 2  2. IUD (intrauterine device) in place Follow up with GYN  3. Irregular menses  4. Weight loss Encourage high protein diet, can use protein shakes for breakfast when she does not feel like eating. TSH was checked last visit and was normal    Continue current medications  Keep follow up with specialists  Health Maintenance reviewed Diet and exercise encouraged  No follow-ups on file.    Bari Learn, FNP

## 2023-03-06 NOTE — Patient Instructions (Signed)
 Thoracic Strain Rehab Ask your health care provider which exercises are safe for you. Do exercises exactly as told by your provider and adjust them as directed. It is normal to feel mild stretching, pulling, tightness, or discomfort as you do these exercises. Stop right away if you feel sudden pain or your pain gets worse. Do not begin these exercises until told by your provider. Stretching and range-of-motion exercise This exercise warms up your muscles and joints and improves the movement and flexibility of your back and shoulders. This exercise also helps to relieve pain. Chest and spine stretch  Lie down on your back on a firm surface. Roll a towel or a small blanket so it is about 4 inches (10 cm) in diameter. Put the towel under the middle of your back so it is under your spine, but not under your shoulder blades. Put your hands behind your head and let your elbows fall to your sides. This will increase your stretch. Take a deep breath (inhale). Hold for __________ seconds. Relax after you breathe out (exhale). Repeat __________ times. Complete this exercise __________ times a day. Strengthening exercises These exercises build strength and endurance in your back and your shoulder blade muscles. Endurance is the ability to use your muscles for a long time, even after they get tired. Alternating arm and leg raises  Get on your hands and knees on a firm surface. If you are on a hard floor, you may want to use padding, such as an exercise mat, to cushion your knees. Line up your arms and legs. Your hands should be directly below your shoulders, and your knees should be directly below your hips. Lift your left leg behind you. At the same time, raise your right arm and straighten it in front of you. Do not lift your leg higher than your hip. Do not lift your arm higher than your shoulder. Keep your abdominal and back muscles tight. Keep your hips facing the ground. Do not arch your  back. Carefully stay balanced. Do not hold your breath. Hold for __________ seconds. Slowly return to the starting position and repeat with your right leg and your left arm. Repeat __________ times. Complete this exercise __________ times a day. Straight arm rows This exercise is also called the shoulder extension exercise. Stand with your feet shoulder width apart. Secure an exercise band to a stable object in front of you so the band is at or above shoulder height. Hold one end of the exercise band in each hand. Straighten your elbows and lift your hands up to shoulder height. Step back, away from the secured end of the exercise band, until the band stretches. Squeeze your shoulder blades together and pull your hands down to the sides of your thighs. Stop when your hands are straight down by your sides. This is shoulder extension. Do not let your hands go behind your body. Hold for __________ seconds. Slowly return to the starting position. Repeat __________ times. Complete this exercise __________ times a day. Rowing scapular retraction This is an exercise in which the shoulder blades (scapulae) are pulled toward each other (retraction). Sit in a stable chair without armrests, or stand up. Secure an exercise band to a stable object in front of you so the band is at shoulder height. Hold one end of the exercise band in each hand. Your palms should face toward each other. Bring your arms out straight in front of you. Step back, away from the secured end of the  exercise band, until the band stretches. Pull the band backward. As you do this, bend your elbows and squeeze your shoulder blades together, but avoid letting the rest of your body move. Do not shrug your shoulders upward while you do this. Stop when your elbows are at your sides or slightly behind your body. Hold for __________ seconds. Slowly straighten your arms to return to the starting position. Repeat __________ times.  Complete this exercise __________ times a day. Posture and body mechanics Good posture and healthy body mechanics can help to relieve stress in your body's tissues and joints. Body mechanics refers to the movements and positions of your body while you do your daily activities. Posture is part of body mechanics. Good posture means: Your spine is in its natural S-curve position (neutral). Your shoulders are pulled back slightly. Your head is not tipped forward. Follow these guidelines to improve your posture and body mechanics in your everyday activities. Standing  When standing, keep your spine neutral and your feet about hip width apart. Keep a slight bend in your knees. Your ears, shoulders, and hips should line up with each other. When you do a task in which you lean forward while standing in one place for a long time, place one foot up on a stable object that is 2-4 inches (5-10 cm) high, such as a footstool. This helps keep your spine neutral. Sitting  When sitting, keep your spine neutral and keep your feet flat on the floor. Use a footrest if needed. Keep your thighs parallel to the floor. Avoid rounding your shoulders, and avoid tilting your head forward. When working at a desk or a computer, keep your desk at a height where your hands are slightly lower than your elbows. Slide your chair under your desk so you are close enough to maintain good posture. When working at a computer, place your monitor at a height where you are looking straight ahead and you do not have to tilt your head forward or downward to look at the screen. Resting When lying down and resting, avoid positions that are most painful for you. If you have pain with activities such as sitting, bending, stooping, or squatting (flexion-basedactivities), lie in a position in which your body does not bend very much. For example, avoid curling up on your side with your arms and knees near your chest (fetal position). If you have  pain with activities such as standing for a long time or reaching with your arms (extension-basedactivities), lie with your spine in a neutral position and bend your knees slightly. Try the following positions: Lie on your side with a pillow between your knees. Lie on your back with a pillow under your knees.  Lifting  When lifting objects, keep your feet at least shoulder width apart and tighten your abdominal muscles. Bend your knees and hips and keep your spine neutral. It is important to lift using the strength of your legs, not your back. Do not lock your knees straight out. Always ask for help to lift heavy or awkward objects. This information is not intended to replace advice given to you by your health care provider. Make sure you discuss any questions you have with your health care provider. Document Revised: 06/30/2022 Document Reviewed: 09/05/2021 Elsevier Patient Education  2024 [employer].

## 2023-03-28 NOTE — Progress Notes (Unsigned)
 03/29/2023 Tonya Franklin [id number] Jan 19, 1996  Referring provider: Junie Spencer, FNP Primary GI doctor: Dr. Lavon Paganini  ASSESSMENT AND PLAN:   Rectal bleeding likely Posterior Anal Fissure Ongoing for several months. Sometimes bright red, both within and outside of stool. Unclear if from hemorrhoids or another source. Painful bowel movements and rectal bleeding.  Likely secondary to constipation.  Positive for blood on rectal exam. -Start CalMod 4 suppositories twice daily for 6-8 weeks. -Consider adding a calcium channel blocker cream if no improvement. We have discussed the risks of bleeding, infection, perforation, medication reactions, and remote risk of death associated with colonoscopy. All questions were answered and the patient acknowledges these risk and wishes to proceed.  GERD long standing with occ induced vomiting due to dysphagia Not on any medications Start on pepcid Lifestyle changes discussed, avoid NSAIDS, ETOH, hand out given to the patient Weight loss discussed with the patient Smoking cessation discussed in detail Will get Diatherix stool antigen for H. Pylori I discussed risks of EGD with patient today, including risk of sedation, bleeding or perforation.  Patient provides understanding and gave verbal consent to proceed.  Constipation Infrequent bowel movements (2-3 times per week), straining, and hard stools. Likely contributing to anal fissure and rectal bleeding. -Start MiraLAX and increase dietary fiber.  RUQ AB pain some nausea with food Worse with turning, worse with palpation 05/27/2018 RUQ unremarkable Repeat AB [place] Try salon patches Get EGD  Weight Loss Unintentional loss of 20 pounds over a few months. -Monitor weight closely, consider further workup if continues.  Cannabis Use Occasional use reported. -Provide information about cannabinoid hyperemesis syndrome.        Patient Care Team: Junie Spencer, FNP as PCP -  General (Family Medicine)  HISTORY OF PRESENT ILLNESS: 28 y.o. female with a past medical history of hypertension, OCD, history of opioid abuse, anxiety depression and others listed below presents for evaluation of rectal bleeding.   10/05/2022 CBC without anemia  Discussed the use of AI scribe software for clinical note transcription with the patient, who gave verbal consent to proceed.  History of Present Illness   She has been experiencing rectal bleeding and abdominal pain for the past few months. The abdominal pain is localized to the right side, just below the rib cage, and feels like a 'knot' that is painful to touch. The rectal bleeding, her primary concern, has been ongoing for a couple of months, with bright red blood sometimes appearing on the outside and within the stool. She also experiences rectal pain, especially when straining during bowel movements, and has a history of hemorrhoids.  She has a history of constipation, with bowel movements occurring two to three times a week. She sometimes uses enemas to relieve constipation. Her stools can vary from soft to hard pellets, and she occasionally experiences diarrhea. She reports burning and itching at the rectum, and urgency with bowel movements. Abdominal pain sometimes improves after a bowel movement.  She has a history of reflux, which was severe during infancy, requiring her to sleep in an elevated position. She occasionally takes Tums or Zantac, particularly during pregnancy, but is not on regular medication for reflux. She experiences difficulty swallowing both food and liquids, describing a sensation of needing to 'chew' her drinks before swallowing. She sometimes vomits undigested food shortly after eating.  She has lost approximately twenty pounds over the past few months without intentional weight loss. No current alcohol use but a history of heavy drinking and  pain medication use following ACL surgery. She smokes marijuana  occasionally. She experiences chills but denies fevers. No family history of colon cancer or autoimmune diseases like Crohn's or ulcerative colitis, but her grandfather had a history of gallbladder issues leading to sepsis.      She  reports that she has been smoking cigarettes. She has never used smokeless tobacco. She reports that she does not drink alcohol and does not use drugs.  RELEVANT GI HISTORY, LABS, IMAGING:  CBC    Component Value Date/Time   WBC 7.1 10/05/2022 1204   WBC 7.1 05/27/2018 1139   RBC 4.72 10/05/2022 1204   RBC 4.26 05/27/2018 1139   HGB 14.1 10/05/2022 1204   HCT 43.1 10/05/2022 1204   PLT 239 10/05/2022 1204   MCV 91 10/05/2022 1204   MCH 29.9 10/05/2022 1204   MCH 31.0 05/27/2018 1139   MCHC 32.7 10/05/2022 1204   MCHC 33.6 05/27/2018 1139   RDW 12.4 10/05/2022 1204   LYMPHSABS 2.2 10/05/2022 1204   MONOABS 0.4 05/27/2018 1139   EOSABS 0.1 10/05/2022 1204   BASOSABS 0.0 10/05/2022 1204   Recent Labs    05/30/22 1243 10/05/22 1204  HGB 13.5 14.1    CMP     Component Value Date/Time   NA 139 10/05/2022 1204   K 4.3 10/05/2022 1204   CL 102 10/05/2022 1204   CO2 20 10/05/2022 1204   GLUCOSE 89 10/05/2022 1204   GLUCOSE 98 05/27/2018 1139   BUN 9 10/05/2022 1204   CREATININE 0.86 10/05/2022 1204   CALCIUM 9.1 10/05/2022 1204   PROT 6.4 10/05/2022 1204   ALBUMIN 4.4 10/05/2022 1204   AST 16 10/05/2022 1204   ALT 13 10/05/2022 1204   ALKPHOS 56 10/05/2022 1204   BILITOT 0.2 10/05/2022 1204   GFRNONAA 104 03/05/2020 0906   GFRAA 120 03/05/2020 0906      Latest Ref Rng & Units 10/05/2022   12:04 PM 05/30/2022   12:43 PM 09/08/2021   10:50 AM  Hepatic Function  Total Protein 6.0 - 8.5 g/dL 6.4  6.4  6.4   Albumin 4.0 - 5.0 g/dL 4.4  4.3  4.3   AST 0 - 40 IU/L 16  13  15    ALT 0 - 32 IU/L 13  19  19    Alk Phosphatase 44 - 121 IU/L 56  58  63   Total Bilirubin 0.0 - 1.2 mg/dL 0.2  0.2  <1.6       Current Medications:   Current  Outpatient Medications (Endocrine & Metabolic):    levonorgestrel (MIRENA) 20 MCG/24HR IUD, by Intrauterine route.   Current Outpatient Medications (Respiratory):    albuterol (VENTOLIN HFA) 108 (90 Base) MCG/ACT inhaler, TAKE 2 PUFFS BY MOUTH EVERY 6 HOURS AS NEEDED FOR WHEEZE OR SHORTNESS OF BREATH   cetirizine (ZYRTEC ALLERGY) 10 MG tablet, Take 1 tablet (10 mg total) by mouth daily.   fluticasone (FLONASE) 50 MCG/ACT nasal spray, Place 2 sprays into both nostrils daily.  Current Outpatient Medications (Analgesics):    diclofenac (VOLTAREN) 75 MG EC tablet, Take 1 tablet (75 mg total) by mouth 2 (two) times daily.   Current Outpatient Medications (Other):    baclofen (LIORESAL) 10 MG tablet, Take 1 tablet (10 mg total) by mouth 3 (three) times daily.   famotidine (PEPCID) 40 MG tablet, Take 1 tablet (40 mg total) by mouth at bedtime.   FLUoxetine (PROZAC) 20 MG tablet, Take 3 tablets (60 mg total) by mouth daily.  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 Family History:  Her family history includes Cancer in her paternal grandmother and other  family members; Diabetes in her father; Hyperlipidemia in her father; Hypertension in her father; Parkinson's disease in her father.  REVIEW OF SYSTEMS  : All other systems reviewed and negative except where noted in the History of Present Illness.  PHYSICAL EXAM: BP 100/80 (BP Location: Left Arm, Patient Position: Sitting, Cuff Size: Normal)   Pulse 71   Ht 5\' 4"  (1.626 m)   Wt 153 lb (69.4 kg)   BMI 26.26 kg/m  Physical Exam   GENERAL APPEARANCE: Well nourished, in no apparent distress. HEENT: No cervical lymphadenopathy, thyroid normal, sclerae anicteric, conjunctiva pink. RESPIRATORY: Respiratory effort normal, breath sounds equal bilaterally without rales, rhonchi, or wheezing. CARDIO: Regular rate and rhythm with no murmurs, rubs, or gallops, peripheral pulses intact. ABDOMEN: Soft, non-distended, decreased bowel sounds, discomfort  tenderness in right lower quadrant, discomfort in left lower quadrant, no rebound, no mass appreciated. RECTAL: No external hemorrhoids, posterior anal fissure present, minimal stool in rectum, hemoccult positive for blood. MUSCULOSKELETAL: Full range of motion, normal gait, without edema. SKIN: Dry, intact without rashes or lesions. No jaundice. NEURO: Alert, oriented, no focal deficits. PSYCH: Cooperative, normal mood and affect.       Doree Albee, PA-C 11:11 AM

## 2023-03-29 ENCOUNTER — Encounter: Payer: Self-pay | Admitting: Physician Assistant

## 2023-03-29 ENCOUNTER — Ambulatory Visit: Payer: No Typology Code available for payment source | Admitting: Physician Assistant

## 2023-03-29 VITALS — BP 100/80 | HR 71 | Ht 64.0 in | Wt 153.0 lb

## 2023-03-29 DIAGNOSIS — K625 Hemorrhage of anus and rectum: Secondary | ICD-10-CM

## 2023-03-29 DIAGNOSIS — R110 Nausea: Secondary | ICD-10-CM

## 2023-03-29 DIAGNOSIS — R634 Abnormal weight loss: Secondary | ICD-10-CM

## 2023-03-29 DIAGNOSIS — F1290 Cannabis use, unspecified, uncomplicated: Secondary | ICD-10-CM

## 2023-03-29 DIAGNOSIS — K219 Gastro-esophageal reflux disease without esophagitis: Secondary | ICD-10-CM | POA: Diagnosis not present

## 2023-03-29 DIAGNOSIS — R1310 Dysphagia, unspecified: Secondary | ICD-10-CM | POA: Diagnosis not present

## 2023-03-29 DIAGNOSIS — R140 Abdominal distension (gaseous): Secondary | ICD-10-CM

## 2023-03-29 DIAGNOSIS — K5900 Constipation, unspecified: Secondary | ICD-10-CM

## 2023-03-29 DIAGNOSIS — R1011 Right upper quadrant pain: Secondary | ICD-10-CM

## 2023-03-29 DIAGNOSIS — R1319 Other dysphagia: Secondary | ICD-10-CM

## 2023-03-29 MED ORDER — SUFLAVE 178.7 G PO SOLR: 1.0000 | Freq: Once | ORAL | 0 refills | Status: AC

## 2023-03-29 MED ORDER — SUFLAVE 178.7 G PO SOLR: 1.0000 | Freq: Once | ORAL | 0 refills | Status: DC

## 2023-03-29 MED ORDER — FAMOTIDINE 40 MG PO TABS: 40.0000 mg | ORAL_TABLET | Freq: Every day | ORAL | 0 refills | Status: AC

## 2023-03-29 NOTE — Patient Instructions (Signed)
 Your provider has requested that you go to the basement level for lab work before leaving today. Press "B" on the elevator. The lab is located at the first door on the left as you exit the elevator.  You have been scheduled for an abdominal ultrasound at [facility] (1st floor of hospital) on 04/04/23 at 10 am. Please arrive 30 minutes prior to your appointment for registration. Make certain not to have anything to eat or drink after midnight. Should you need to reschedule your appointment, please contact radiology at [phone]. This test typically takes about 30 minutes to perform.  You have been scheduled for an endoscopy and colonoscopy. Please follow the written instructions given to you at your visit today.  If you use inhalers (even only as needed), please bring them with you on the day of your procedure.  DO NOT TAKE 7 DAYS PRIOR TO TEST- Trulicity (dulaglutide) Ozempic, Wegovy (semaglutide) Mounjaro (tirzepatide) Bydureon Bcise (exanatide extended release)  DO NOT TAKE 1 DAY PRIOR TO YOUR TEST Rybelsus (semaglutide) Adlyxin (lixisenatide) Victoza (liraglutide) Byetta (exanatide) ___________________________________________________________________________  Bonita Quin will receive your bowel preparation through [facility], which ensures the lowest copay and home delivery, with outreach via text or call from an [phone] number. Please respond promptly to avoid rescheduling of your procedure. If you are interested in alternative options or have any questions regarding your prep, please contact them at [phone] ____________________________________________________________________________  Your Provider Has Sent Your Bowel Prep Regimen To [facility]   [facility] will contact you to verify your information and collect your copay, if applicable. Enjoy the comfort of your home while your prescription is mailed to you, FREE of any shipping charges.   [facility] accepts all major insurance  benefits and applies discounts & coupons.  Have additional questions?   Chat: [email or web address] Call: [phone] Email: [email or web address] NCPDP: [id number]  How will [facility] contact you?  With a Welcome phone call,  a Welcome text and a checkout link in text form.  Texts you receive from [phone] Are NOT Spam.  *To set up delivery, you must complete the checkout process via link or speak to one of the patient care representatives. If [facility] is unable to reach you, your prescription may be delayed.  To avoid long hold times on the phone, you may also utilize the secure chat feature on the [facility] website to request that they call you back for transaction completion or to expedite your concerns.        Anal Fissure, Adult  A fissure is a linear defect in the anal mucosa, symptoms include burning, itching, discomfort especially with a bowel movement with associated rectal bleeding.  Risk factors include low fiber diet, chronic constipation and straining. Anal fissures can take a very long time to heal so this will be a 2 to 36-month process.  Treatment for a fissure includes:  -decreasing time in the toilet should not be more than 5 minutes -adding fiber supplement such as Benefiber or Citrucel -increasing water. -There is a lubricating suppository over-the-counter called Calmol-4 you can get from [facility] or from your pharmacy (may have to order) that I want to do twice daily morning and evening for 8 weeks.  This is a 60 to 80% success rate. -I am also going to send in a calcium channel blocker cream IF YOU ARE NOT DOING BETTER to a compound pharmacy, apply twice daily for 12 weeks. If after 3 months this is not helpful then we will we will refer you to general  surgery for evaluation, they can do Botox injections under anesthesia or surgery.   Please DO NOT go directly from our office to pick up this medication! Give the pharmacy 1 day to process the  prescription. Extra time is required for them to compound your medication.   Please take your proton pump inhibitor medication, pepcid/famotidine  Please take this medication 30 minutes to 1 hour before meals- this makes it more effective.  Avoid spicy and acidic foods Avoid fatty foods Limit your intake of coffee, tea, alcohol, and carbonated drinks Work to maintain a healthy weight Keep the head of the bed elevated at least 3 inches with blocks or a wedge pillow if you are having any nighttime symptoms Stay upright for 2 hours after eating Avoid meals and snacks three to four hours before bedtime Stop smoking  Miralax is an osmotic laxative.  It only brings more water into the stool.  This is safe to take daily.  Can take up to 17 gram of miralax twice a day.  Mix with juice or coffee.  Start 1 capful at night for 3-4 days and reassess your response in 3-4 days.  You can increase and decrease the dose based on your response.  Remember, it can take up to 3-4 days to take effect OR for the effects to wear off.   I often pair this with benefiber in the morning to help assure the stool is not too loose.    Toileting tips to help with your constipation - Drink at least 64-80 ounces of water/liquid per day. - Establish a time to try to move your bowels every day.  For many people, this is after a cup of coffee or after a meal such as breakfast. - Sit all of the way back on the toilet keeping your back fairly straight and while sitting up, try to rest the tops of your forearms on your upper thighs.   - Raising your feet with a step stool/squatty potty can be helpful to improve the angle that allows your stool to pass through the rectum. - Relax the rectum feeling it bulge toward the toilet water.  If you feel your rectum raising toward your body, you are contracting rather than relaxing. - Breathe in and slowly exhale. "Belly breath" by expanding your belly towards your belly button. Keep  belly expanded as you gently direct pressure down and back to the anus.  A low pitched GRRR sound can assist with increasing intra-abdominal pressure.  (Can also trying to blow on a pinwheel and make it move, this helps with the same belly breathing) - Repeat 3-4 times. If unsuccessful, contract the pelvic floor to restore normal tone and get off the toilet.  Avoid excessive straining. - To reduce excessive wiping by teaching your anus to normally contract, place hands on outer aspect of knees and resist knee movement outward.  Hold 5-10 second then place hands just inside of knees and resist inward movement of knees.  Hold 5 seconds.  Repeat a few times each way.  Go to the ER if unable to pass gas, severe AB pain, unable to hold down food, any shortness of breath of chest pain.  First do a trial off milk/lactose products if you use them.  Add fiber like benefiber or citracel once a day Increase activity Can do trial of IBGard which is over the counter for AB pain- Take 1-2 capsules once a day for maintence or twice a day during a flare  FODMAP stands for fermentable oligo-, di-, mono-saccharides and polyols (1). These are the scientific terms used to classify groups of carbs that are difficult for our body to digest and that are notorious for triggering digestive symptoms like bloating, gas, loose stools and stomach pain.   You can try low FODMAP diet  - start with eliminating just one column at a time that you feel may be a trigger for you. - the table at the very bottom contains foods that are low in FODMAPs   Sometimes trying to eliminate the FODMAP's from your diet is difficult or tricky, if you are stuggling with trying to do the elimination diet you can try an enzyme.  There is a food enzymes that you sprinkle in or on your food that helps break down the FODMAP. You can read more about the enzyme by going to this  site: https://fodzyme.com/   ___________________________  Cannabinoid Hyperemesis Syndrome  What is cannabinoid hyperemesis syndrome?  Cannabinoid hyperemesis syndrome (CHS) is a condition that leads to repeated and severe bouts of vomiting. It is rare and only occurs in daily long-term users of marijuana.  Marijuana has several active substances. These include THC and related chemicals. These substances bind to molecules found in the brain. That causes the drug "high" and other effects that users feel.  Your digestive tract also has a number of molecules that bind to [facility] and related substances. So marijuana also affects the digestive tract. For example, the drug can change the time it takes the stomach to empty. It also affects the esophageal sphincter. That's the tight band of muscle that opens and closes to let food from the esophagus into the stomach. Long-term marijuana use can change the way the affected molecules respond and lead to the symptoms of CHS.  Marijuana is the most widely used recreational drug in the [place] Young adults are the most frequent users. A small number of these people develop CHS. It often only happens in people who have regularly used marijuana. Often CHS affects those who use the drug at least once a day.  What causes cannabinoid hyperemesis syndrome?  Marijuana has very complex effects on the body. Experts are still trying to learn exactly how it causes CHS in some people.  In the brain, marijuana often has the opposite effect of CHS. It helps prevent nausea and vomiting. The drug is also good at stopping such symptoms in people having chemotherapy.  But in the digestive tract, marijuana seems to have the opposite effect. It actually makes you more likely to have nausea and vomiting. With the first use of marijuana, the signals from the brain may be more important. That may lead to anti-nausea effects at first. But with repeated use of marijuana, certain  receptors in the brain may stop responding to the drug in the same way. That may cause the repeated bouts of vomiting found in people with CHS.  It still isn't clear why some heavy marijuana users get the syndrome, but others don't.  What are the symptoms of cannabinoid hyperemesis syndrome?  People with CHS suffer from repeated bouts of vomiting. In between these episodes are times without any symptoms. Healthcare providers often divide these symptoms into 3 stages: the prodromal phase, the hyperemetic phase, and the recovery phase.  Prodromal phase. During this phase, the main symptoms are often early morning nausea and belly (abdominal) pain. Some people also develop a fear of vomiting. Most people keep normal eating patterns during this time. Some people  use more marijuana because they think it will help stop the nausea. This phase may last for months or years.  Hyperemetic phase. Symptoms during this time may include:  Ongoing nausea  Repeated episodes of vomiting  Belly pain  Decreased food intake and weight loss  Symptoms of fluid loss (dehydration)  During this phase, vomiting is often intense and overwhelming. Many people take a lot of hot showers during the day. They find that doing so eases their nausea. (That may be because of how the hot temperature affects a part of the brain called the hypothalamus. This part of the brain effects both temperature regulation and vomiting.) People often first seek medical care during this phase.  The hyperemetic phase may continue until the person completely stops using marijuana. Then the recovery phase starts.  Recovery phase. During this time, symptoms go away. Normal eating is possible again. This phase can last days or months. Symptoms often come back if the person tries marijuana again.  How is cannabinoid hyperemesis syndrome diagnosed?  Many health problems can cause repeated vomiting. To make a diagnosis, your healthcare provider  will ask you about your symptoms and your past health. He or she will also do a physical exam, including an exam of your belly. Often this diagnosis can be made from history alone, however.  Your healthcare provider may also need more tests to rule out other causes of the vomiting.  CHS was only recently discovered. So some healthcare providers may not know about it. As a result, they may not spot it for many years. They often confuse CHS with cyclical vomiting disorder. That is a health problem that causes similar symptoms. A specialist trained in diseases of the digestive tract (gastroenterologist) might make the diagnosis.  You may have CHS if you have all of these:  Long-term weekly and daily marijuana use  Belly pain  Severe, repeated nausea and vomiting  You feel better after taking a hot shower  There is no single test that confirms this diagnosis. Only improvement after quitting marijuana confirms the diagnosis.  How is cannabinoid hyperemesis syndrome treated?  To fully get better, you need to stop using marijuana all together. If you stop using marijuana, your symptoms should not come back.  Symptoms often ease after a day or 2 unless marijuana is used before this time.  In a small sample of people with CHS, rubbing capsaicin cream on the belly helped decrease pain and nausea. The chemicals in the cream have the same effect as a hot shower.  Quitting marijuana may lead to other health benefits, such as:  Better lung function  Improved memory and thinking skills  Better sleep  Key points about cannabinoid hyperemesis syndrome  CHS is a condition that leads to repeated and severe bouts of vomiting. It results from long-term use of marijuana.  Most people self-treat using hot showers to help reduce their symptoms.  Some people with CHS may not be diagnosed for several years. Admitting to your healthcare provider that you use marijuana daily can speed up the  diagnosis.  Symptoms start to go away within a day or 2 after stopping marijuana use.  Symptoms can come back if you use marijuana again

## 2023-04-04 ENCOUNTER — Ambulatory Visit (HOSPITAL_COMMUNITY): Admission: RE | Admit: 2023-04-04 | Payer: No Typology Code available for payment source | Source: Ambulatory Visit

## 2023-04-10 ENCOUNTER — Other Ambulatory Visit: Payer: Self-pay | Admitting: Family

## 2023-04-25 ENCOUNTER — Telehealth: Payer: Self-pay

## 2023-04-25 NOTE — Telephone Encounter (Signed)
 Attempted to reach patient; unable to speak with patient; left message for patient to call [facility] asap if she has not received her prep yet; number for [facility] provided on voicemail;

## 2023-05-01 ENCOUNTER — Encounter: Payer: No Typology Code available for payment source | Admitting: Gastroenterology

## 2023-05-07 ENCOUNTER — Telehealth: Payer: Self-pay

## 2023-05-07 ENCOUNTER — Other Ambulatory Visit: Payer: Self-pay | Admitting: Family

## 2023-05-07 DIAGNOSIS — F321 Major depressive disorder, single episode, moderate: Secondary | ICD-10-CM

## 2023-05-07 DIAGNOSIS — F411 Generalized anxiety disorder: Secondary | ICD-10-CM

## 2023-05-07 NOTE — Telephone Encounter (Unsigned)
 Copied from CRM [id number]. Topic: Clinical - Medication Question >> May 07, 2023  3:03 PM Marlow Baars wrote: Reason for CRM: The patient called in wanting to speak with her provider to see if there are any interactions with the medications she is taking. She was also recently prescribed Suboxone by an online provider and she wants to make sure there will be no interaction with her FLUoxetine (PROZAC) 20 MG tablet. Please assist patient further

## 2023-05-07 NOTE — Telephone Encounter (Signed)
 Copied from CRM [id number]. Topic: Clinical - Medication Refill >> May 07, 2023  2:57 PM Marlow Baars wrote: Most Recent Primary Care Visit:   Medication: FLUoxetine (PROZAC) 20 MG tablet  Has the patient contacted their pharmacy? No   Is this the correct pharmacy for this prescription? Yes If no, delete pharmacy and type the correct one.  This is the patient's preferred pharmacy:  [id number] - [place],  - [address] [place] [address] Phone: [phone] Fax: [phone]   Has the prescription been filled recently? No  Is the patient out of the medication? Yes but she didn't know she was supposed to call a few days before and she didn't pick up her last fill as she still has some left.  Has the patient been seen for an appointment in the last year OR does the patient have an upcoming appointment? Yes  Can we respond through MyChart? Yes  Please assist patient further

## 2023-05-08 MED ORDER — FLUOXETINE HCL 20 MG PO TABS
60.0000 mg | ORAL_TABLET | Freq: Every day | ORAL | 0 refills | Status: AC
Start: 2023-05-08 — End: ?

## 2023-05-10 NOTE — Telephone Encounter (Signed)
 Patient aware and verbalized understanding.

## 2023-05-10 NOTE — Telephone Encounter (Signed)
 Ok to continue all medications.

## 2023-05-24 ENCOUNTER — Ambulatory Visit (AMBULATORY_SURGERY_CENTER)

## 2023-05-24 VITALS — Ht 64.0 in | Wt 148.0 lb

## 2023-05-24 DIAGNOSIS — K625 Hemorrhage of anus and rectum: Secondary | ICD-10-CM

## 2023-05-24 DIAGNOSIS — K219 Gastro-esophageal reflux disease without esophagitis: Secondary | ICD-10-CM

## 2023-05-24 MED ORDER — NA SULFATE-K SULFATE-MG SULF 17.5-3.13-1.6 GM/177ML PO SOLN
1.0000 | Freq: Once | ORAL | 0 refills | Status: AC
Start: 2023-05-24 — End: 2023-05-24

## 2023-05-24 NOTE — Progress Notes (Signed)

## 2023-05-28 ENCOUNTER — Encounter: Payer: Self-pay | Admitting: Certified Registered Nurse Anesthetist

## 2023-06-01 ENCOUNTER — Telehealth: Payer: Self-pay | Admitting: Gastroenterology

## 2023-06-01 ENCOUNTER — Encounter: Admitting: Gastroenterology

## 2023-06-01 NOTE — Telephone Encounter (Signed)
 Good Afternoon Dr. Leonia Raman,  I called this patient at 1:52 to see if she was coming for her procedure today.    I could not leave a message her voicemail box was full.   I will NO SHOW her.   [employer]

## 2023-10-17 ENCOUNTER — Encounter: Payer: Self-pay | Admitting: Family Medicine

## 2023-10-17 ENCOUNTER — Telehealth (INDEPENDENT_AMBULATORY_CARE_PROVIDER_SITE_OTHER): Payer: Self-pay | Admitting: Family Medicine

## 2023-10-17 DIAGNOSIS — U071 COVID-19: Secondary | ICD-10-CM

## 2023-10-17 NOTE — Progress Notes (Addendum)
 Virtual Visit via MyChart video note  I connected with Tonya Franklin on 10/17/23 at 1532 by video and verified that I am speaking with the correct person using two identifiers. Tonya Franklin is currently located at home and patient are currently with her during visit. The provider, Fonda LABOR Romone Shaff, MD is located in their office at time of visit.  Call ended at 1538  I discussed the limitations, risks, security and privacy concerns of performing an evaluation and management service by video and the availability of in person appointments. I also discussed with the patient that there may be a patient responsible charge related to this service. The patient expressed understanding and agreed to proceed.   History and Present Illness:  Discussed the use of AI scribe software for clinical note transcription with the patient, who gave verbal consent to proceed.  History of Present Illness   Tonya Franklin is a 28 year old female who presents with symptoms consistent with a viral infection, including fever and shortness of breath.  She has been experiencing fever, cold symptoms, and general malaise for the past week. The fever has been high, though the exact temperature is unspecified. She reports that a home test was positive, which she associates with her symptoms.  She describes shortness of breath. She has also been drinking ginger ale to manage her symptoms, noting some improvement with these treatments.  No trouble breathing or wheezing is reported, but she feels dehydrated.         Outpatient Encounter Medications as of 10/17/2023  Medication Sig   albuterol  (VENTOLIN  HFA) 108 (90 Base) MCG/ACT inhaler TAKE 2 PUFFS BY MOUTH EVERY 6 HOURS AS NEEDED FOR WHEEZE OR SHORTNESS OF BREATH   cetirizine  (ZYRTEC  ALLERGY) 10 MG tablet Take 1 tablet (10 mg total) by mouth daily. (Patient not taking: Reported on 05/24/2023)   diclofenac  (VOLTAREN ) 75 MG EC tablet Take 1 tablet (75 mg  total) by mouth 2 (two) times daily. (Patient not taking: Reported on 05/24/2023)   famotidine  (PEPCID ) 40 MG tablet Take 1 tablet (40 mg total) by mouth at bedtime. (Patient not taking: Reported on 05/24/2023)   FLUoxetine  (PROZAC ) 20 MG tablet Take 3 tablets (60 mg total) by mouth daily. **NEEDS TO BE SEEN BEFORE NEXT REFILL**   fluticasone  (FLONASE ) 50 MCG/ACT nasal spray Place 2 sprays into both nostrils daily.   hydrOXYzine  (VISTARIL ) 25 MG capsule TAKE 1 CAPSULE BY MOUTH THREE TIMES A DAY AS NEEDED   levonorgestrel (MIRENA) 20 MCG/24HR IUD by Intrauterine route.   No facility-administered encounter medications on file as of 10/17/2023.    Review of Systems  Constitutional:  Negative for chills and fever.  HENT:  Positive for congestion, postnasal drip and sore throat. Negative for ear discharge, ear pain, rhinorrhea, sinus pressure and sneezing.   Eyes:  Negative for pain, redness and visual disturbance.  Respiratory:  Positive for cough. Negative for chest tightness, shortness of breath and wheezing.   Cardiovascular:  Negative for chest pain and leg swelling.  Genitourinary:  Negative for difficulty urinating and dysuria.  Musculoskeletal:  Negative for back pain and gait problem.  Skin:  Negative for rash.  Neurological:  Negative for light-headedness and headaches.  Psychiatric/Behavioral:  Negative for agitation and behavioral problems.   All other systems reviewed and are negative.   Observations/Objective: Patient is comfortable and in no acute distress  Assessment and Plan: Problem List Items Addressed This Visit   None Visit Diagnoses  COVID-19 virus infection    -  Primary          COVID-19 infection with acute upper respiratory symptoms Positive COVID-19 test with fever, anosmia, ageusia, and dyspnea. Symptoms improving with treatment. No wheezing or significant respiratory distress. - Continue Nyquil and Mucinex . - Use nasal saline sprays. - Perform salt  water gargles. - Ensure adequate rest and hydration. - Supportive care.  Mild dehydration - Increase fluid intake.        Follow up plan: Return if symptoms worsen or fail to improve.     I discussed the assessment and treatment plan with the patient. The patient was provided an opportunity to ask questions and all were answered. The patient agreed with the plan and demonstrated an understanding of the instructions.   The patient was advised to call back or seek an in-person evaluation if the symptoms worsen or if the condition fails to improve as anticipated.  The above assessment and management plan was discussed with the patient. The patient verbalized understanding of and has agreed to the management plan. Patient is aware to call the clinic if symptoms persist or worsen. Patient is aware when to return to the clinic for a follow-up visit. Patient educated on when it is appropriate to go to the emergency department.    I provided 6 minutes of non-face-to-face time during this encounter.    Fonda DELENA Levins, MD
